# Patient Record
Sex: Male | Born: 1950 | Hispanic: No | Marital: Married | Smoking: Never smoker
Health system: Southern US, Community
[De-identification: ages and names within clinical notes are randomized; demographics above are authoritative.]

## PROBLEM LIST (undated history)

## (undated) DIAGNOSIS — E785 Hyperlipidemia, unspecified: Secondary | ICD-10-CM

## (undated) DIAGNOSIS — T7840XA Allergy, unspecified, initial encounter: Secondary | ICD-10-CM

## (undated) DIAGNOSIS — I10 Essential (primary) hypertension: Secondary | ICD-10-CM

## (undated) DIAGNOSIS — J45909 Unspecified asthma, uncomplicated: Secondary | ICD-10-CM

## (undated) DIAGNOSIS — R251 Tremor, unspecified: Secondary | ICD-10-CM

## (undated) HISTORY — DX: Unspecified asthma, uncomplicated: J45.909

## (undated) HISTORY — DX: Hyperlipidemia, unspecified: E78.5

## (undated) HISTORY — PX: HERNIA REPAIR: SHX51

## (undated) HISTORY — DX: Allergy, unspecified, initial encounter: T78.40XA

## (undated) HISTORY — PX: CARPAL TUNNEL RELEASE: SHX101

## (undated) HISTORY — PX: SHOULDER SURGERY: SHX246

## (undated) HISTORY — DX: Tremor, unspecified: R25.1

## (undated) HISTORY — PX: OTHER SURGICAL HISTORY: SHX169

## (undated) HISTORY — PX: CATARACT EXTRACTION BILATERAL W/ ANTERIOR VITRECTOMY: SHX1304

## (undated) HISTORY — DX: Essential (primary) hypertension: I10

---

## 2001-01-12 ENCOUNTER — Ambulatory Visit (HOSPITAL_BASED_OUTPATIENT_CLINIC_OR_DEPARTMENT_OTHER): Admission: RE | Admit: 2001-01-12 | Discharge: 2001-01-12 | Payer: Self-pay | Admitting: General Surgery

## 2004-09-22 ENCOUNTER — Ambulatory Visit (HOSPITAL_COMMUNITY): Admission: RE | Admit: 2004-09-22 | Discharge: 2004-09-22 | Payer: Self-pay | Admitting: Orthopedic Surgery

## 2012-09-08 ENCOUNTER — Ambulatory Visit: Payer: Self-pay | Admitting: Emergency Medicine

## 2012-09-08 VITALS — BP 142/79 | HR 88 | Temp 98.5°F | Resp 16 | Ht 69.0 in | Wt 170.0 lb

## 2012-09-08 DIAGNOSIS — S61209A Unspecified open wound of unspecified finger without damage to nail, initial encounter: Secondary | ICD-10-CM

## 2012-09-08 NOTE — Patient Instructions (Signed)

## 2012-09-08 NOTE — Progress Notes (Signed)
  Subjective:    Patient ID: Alan Ball, male    DOB: 12-18-1950, 62 y.o.   MRN: [id number]  HPI patient was in his usual state of health and while working with a table saw tried to push a board off the table and sustained a laceration to the tip of his right fifth finger    Review of Systems     Objective:   Physical Exam there is a 1 cm flap-like laceration to the tip of the right fifth finger.        Assessment & Plan:  Patient will have wound repair. He will be updated on his tetanus.

## 2012-09-08 NOTE — Progress Notes (Signed)
Procedure:  Consent obtained.  Local anesthesia with 1% lidocaine.  Wound cleaned and edges debrided to make a smooth edge.  The wound was superficially closed with 5-0 Ethilon #3 sutures and Vicryl #1 into the nail bed.  Drsg applied.  Pt tolerated well.

## 2012-10-04 DIAGNOSIS — K4090 Unilateral inguinal hernia, without obstruction or gangrene, not specified as recurrent: Secondary | ICD-10-CM | POA: Insufficient documentation

## 2013-11-24 ENCOUNTER — Ambulatory Visit (INDEPENDENT_AMBULATORY_CARE_PROVIDER_SITE_OTHER): Payer: Medicare HMO

## 2013-11-24 ENCOUNTER — Ambulatory Visit (INDEPENDENT_AMBULATORY_CARE_PROVIDER_SITE_OTHER): Payer: Medicare HMO | Admitting: Family Medicine

## 2013-11-24 VITALS — BP 134/78 | HR 68 | Temp 98.8°F | Resp 16 | Ht 67.5 in | Wt 173.2 lb

## 2013-11-24 DIAGNOSIS — J988 Other specified respiratory disorders: Secondary | ICD-10-CM

## 2013-11-24 DIAGNOSIS — R059 Cough, unspecified: Secondary | ICD-10-CM

## 2013-11-24 DIAGNOSIS — J45901 Unspecified asthma with (acute) exacerbation: Secondary | ICD-10-CM

## 2013-11-24 DIAGNOSIS — R062 Wheezing: Secondary | ICD-10-CM

## 2013-11-24 DIAGNOSIS — J811 Chronic pulmonary edema: Secondary | ICD-10-CM

## 2013-11-24 DIAGNOSIS — J069 Acute upper respiratory infection, unspecified: Secondary | ICD-10-CM

## 2013-11-24 DIAGNOSIS — R05 Cough: Secondary | ICD-10-CM

## 2013-11-24 DIAGNOSIS — J4521 Mild intermittent asthma with (acute) exacerbation: Secondary | ICD-10-CM

## 2013-11-24 DIAGNOSIS — J22 Unspecified acute lower respiratory infection: Secondary | ICD-10-CM

## 2013-11-24 MED ORDER — ALBUTEROL SULFATE HFA 108 (90 BASE) MCG/ACT IN AERS: 1.0000 | INHALATION_SPRAY | RESPIRATORY_TRACT | Status: DC | PRN

## 2013-11-24 MED ORDER — ALBUTEROL SULFATE (2.5 MG/3ML) 0.083% IN NEBU
2.5000 mg | INHALATION_SOLUTION | Freq: Once | RESPIRATORY_TRACT | Status: AC
  Administered 2013-11-24: 2.5 mg via RESPIRATORY_TRACT

## 2013-11-24 MED ORDER — AZITHROMYCIN 250 MG PO TABS: ORAL_TABLET | ORAL | Status: DC

## 2013-11-24 NOTE — Progress Notes (Signed)
Subjective:    Patient ID: Alan Ball, male    DOB: [birth year], 63 y.o.   MRN: [id number]  HPI Alan Ball is a 63 y.o. male  Sinus and upper respiratory sx's.  Staying in hotel last week.  Kids there. Started with runny nose, sore throat, had some asthma sx's initially - wheezing initially in this illness.  Resolved on own - does not have inhaler, has otc "asthmanefrin" into nebulizer if needed.  More congestion in the morning , some pain behind teetha nd pressure in cheek bones. Occasional sweats at times, fever on 2nd night, no recent measured fever. Sore with cough on chest muscles, congestion in face and chest - trying to cough something up. Coughing up green sputum in the morning. Not dyspneic.   Hx of wheezing occasionally - illness or dust at times. Works in [employer]. Not every week, once every few months. Cold weather affects it. No known allergies.   There are no active problems to display for this patient.  Past Medical History  Diagnosis Date  . Allergy   . Asthma    Past Surgical History  Procedure Laterality Date  . Hernia repair     No Known Allergies Prior to Admission medications   Medication Sig Start Date End Date Taking? Authorizing Provider  Multiple Vitamin (MULTIVITAMIN) tablet Take 1 tablet by mouth.   Yes Historical Provider, MD  vitamin C (ASCORBIC ACID) 500 MG tablet Take 500 mg by mouth daily.   Yes Historical Provider, MD  ibuprofen (ADVIL,MOTRIN) 200 MG tablet Take 200 mg by mouth every 6 (six) hours as needed for pain.    Historical Provider, MD   History   Social History  . Marital Status: Married    Spouse Name: N/A    Number of Children: N/A  . Years of Education: N/A   Occupational History  . Not on file.   Social History Main Topics  . Smoking status: Never Smoker   . Smokeless tobacco: Not on file  . Alcohol Use: No  . Drug Use: Not on file  . Sexual Activity: Not on file   Other Topics Concern  . Not on file   Social  History Narrative  . No narrative on file      Review of Systems  HENT: Positive for congestion, sinus pressure and sore throat.   Respiratory: Positive for cough and wheezing.   Cardiovascular: Negative for chest pain, palpitations and leg swelling.       Objective:   Physical Exam  Vitals reviewed. Constitutional: He is oriented to person, place, and time. He appears well-developed and well-nourished.  HENT:  Head: Normocephalic and atraumatic.  Right Ear: Tympanic membrane, external ear and ear canal normal.  Left Ear: Tympanic membrane, external ear and ear canal normal.  Nose: No rhinorrhea.  Mouth/Throat: Oropharynx is clear and moist and mucous membranes are normal. No oropharyngeal exudate or posterior oropharyngeal erythema.  Eyes: Conjunctivae are normal. Pupils are equal, round, and reactive to light.  Neck: Neck supple.  Cardiovascular: Normal rate, regular rhythm, normal heart sounds and intact distal pulses.   No murmur heard. Pulmonary/Chest: Effort normal. He has wheezes (with coarse breath sounds  RLL, LLL. ). He has no rhonchi. He has no rales.  Abdominal: Soft. There is no tenderness.  Lymphadenopathy:    He has no cervical adenopathy.  Neurological: He is alert and oriented to person, place, and time.  Skin: Skin is warm and dry. No rash noted.  Psychiatric: He has a normal mood and affect. His behavior is normal.   Filed Vitals:   11/24/13 0948  BP: 134/78  Pulse: 68  Temp: 98.8 F (37.1 C)  TempSrc: Oral  Resp: 16  Height: 5' 7.5" (1.715 m)  Weight: 173 lb 3.2 oz (78.563 kg)  SpO2: 94%   Albuterol 2.5 mg neb given. Exam after neb: improved aeration, few faint coarse breath sounds LLL>RLL. No distress. [facility] reading (PRIMARY) by  Dr. Carlota Raspberry: CXR: increased RLL greater than LLL markings.      Assessment & Plan:   Alan Ball is a 63 y.o. male Wheezing - Plan: albuterol (PROVENTIL) (2.5 MG/3ML) 0.083% nebulizer solution 2.5 mg, DG Chest 2  View, albuterol (PROVENTIL HFA;VENTOLIN HFA) 108 (90 BASE) MCG/ACT inhaler  Cough - Plan: albuterol (PROVENTIL) (2.5 MG/3ML) 0.083% nebulizer solution 2.5 mg, DG Chest 2 View, albuterol (PROVENTIL HFA;VENTOLIN HFA) 108 (90 BASE) MCG/ACT inhaler  Asthma with acute exacerbation, mild intermittent - Plan: albuterol (PROVENTIL) (2.5 MG/3ML) 0.083% nebulizer solution 2.5 mg  Pulmonary congestion and hypostasis - Plan: albuterol (PROVENTIL) (2.5 MG/3ML) 0.083% nebulizer solution 2.5 mg  Acute upper respiratory infections of unspecified site - Plan: albuterol (PROVENTIL) (2.5 MG/3ML) 0.083% nebulizer solution 2.5 mg  LRTI (lower respiratory tract infection) - Plan: azithromycin (ZITHROMAX) 250 MG tablet  Suspected LRTi, vs asthmatic bronchitis. Has not had formal diagnosis of asthma, but plan on spirometry when well. Mild/intermittent by sx's. Start zpak, albuterol if needed (RTC if frequent or persistent use). rtc precautions discussed.    Meds ordered this encounter  Medications  . vitamin C (ASCORBIC ACID) 500 MG tablet    Sig: Take 500 mg by mouth daily.  . Multiple Vitamin (MULTIVITAMIN) tablet    Sig: Take 1 tablet by mouth.  Marland Kitchen albuterol (PROVENTIL) (2.5 MG/3ML) 0.083% nebulizer solution 2.5 mg    Sig:   . albuterol (PROVENTIL HFA;VENTOLIN HFA) 108 (90 BASE) MCG/ACT inhaler    Sig: Inhale 1-2 puffs into the lungs every 4 (four) hours as needed for wheezing or shortness of breath.    Dispense:  1 Inhaler    Refill:  0  . azithromycin (ZITHROMAX) 250 MG tablet    Sig: Take 2 pills by mouth on day 1, then 1 pill by mouth per day on days 2 through 5.    Dispense:  5 tablet    Refill:  0   Patient Instructions  Start zpak, use albuterol every 4-6 hours as needed for cough/wheezing.  recheck in next 3-4 days if not improving, Return to the clinic or go to the nearest emergency room if any of your symptoms worsen or new symptoms occur.  Once you are feeling back at your baseline -  recommend return for pulmonary function testing or I can refer you to pulmonologist for this if needed.   Bronchospasm A bronchospasm is a spasm or tightening of the airways going into the lungs. During a bronchospasm breathing becomes more difficult because the airways get smaller. When this happens there can be coughing, a whistling sound when breathing (wheezing), and difficulty breathing. Bronchospasm is often associated with asthma, but not all patients who experience a bronchospasm have asthma. CAUSES  A bronchospasm is caused by inflammation or irritation of the airways. The inflammation or irritation may be triggered by:   Allergies (such as to animals, pollen, food, or mold). Allergens that cause bronchospasm may cause wheezing immediately after exposure or many hours later.   Infection. Viral infections are believed to be  the most common cause of bronchospasm.   Exercise.   Irritants (such as pollution, cigarette smoke, strong odors, aerosol sprays, and paint fumes).   Weather changes. Winds increase molds and pollens in the air. Rain refreshes the air by washing irritants out. Cold air may cause inflammation.   Stress and emotional upset.  SIGNS AND SYMPTOMS   Wheezing.   Excessive nighttime coughing.   Frequent or severe coughing with a simple cold.   Chest tightness.   Shortness of breath.  DIAGNOSIS  Bronchospasm is usually diagnosed through a history and physical exam. Tests, such as chest X-rays, are sometimes done to look for other conditions. TREATMENT   Inhaled medicines can be given to open up your airways and help you breathe. The medicines can be given using either an inhaler or a nebulizer machine.  Corticosteroid medicines may be given for severe bronchospasm, usually when it is associated with asthma. HOME CARE INSTRUCTIONS   Always have a plan prepared for seeking medical care. Know when to call your health care provider and local emergency  services (911 in the [place] Know where you can access local emergency care.  Only take medicines as directed by your health care provider.  If you were prescribed an inhaler or nebulizer machine, ask your health care provider to explain how to use it correctly. Always use a spacer with your inhaler if you were given one.  It is necessary to remain calm during an attack. Try to relax and breathe more slowly.  Control your home environment in the following ways:   Change your heating and air conditioning filter at least once a month.   Limit your use of fireplaces and wood stoves.  Do not smoke and do not allow smoking in your home.   Avoid exposure to perfumes and fragrances.   Get rid of pests (such as roaches and mice) and their droppings.   Throw away plants if you see mold on them.   Keep your house clean and dust free.   Replace carpet with wood, tile, or vinyl flooring. Carpet can trap dander and dust.   Use allergy-proof pillows, mattress covers, and box spring covers.   Wash bed sheets and blankets every week in hot water and dry them in a dryer.   Use blankets that are made of polyester or cotton.   Wash hands frequently. SEEK MEDICAL CARE IF:   You have muscle aches.   You have chest pain.   The sputum changes from clear or white to yellow, green, gray, or bloody.   The sputum you cough up gets thicker.   There are problems that may be related to the medicine you are given, such as a rash, itching, swelling, or trouble breathing.  SEEK IMMEDIATE MEDICAL CARE IF:   You have worsening wheezing and coughing even after taking your prescribed medicines.   You have increased difficulty breathing.   You develop severe chest pain. MAKE SURE YOU:   Understand these instructions.  Will watch your condition.  Will get help right away if you are not doing well or get worse. Document Released: 04/23/2003 Document Revised: 04/25/2013 Document  Reviewed: 10/10/2012 [facility] Patient Information 2015 [place], [place]. This information is not intended to replace advice given to you by your health care provider. Make sure you discuss any questions you have with your health care provider. Acute Bronchitis Bronchitis is inflammation of the airways that extend from the windpipe into the lungs (bronchi). The inflammation often causes mucus  to develop. This leads to a cough, which is the most common symptom of bronchitis.  In acute bronchitis, the condition usually develops suddenly and goes away over time, usually in a couple weeks. Smoking, allergies, and asthma can make bronchitis worse. Repeated episodes of bronchitis may cause further lung problems.  CAUSES Acute bronchitis is most often caused by the same virus that causes a cold. The virus can spread from person to person (contagious) through coughing, sneezing, and touching contaminated objects. SIGNS AND SYMPTOMS   Cough.   Fever.   Coughing up mucus.   Body aches.   Chest congestion.   Chills.   Shortness of breath.   Sore throat.  DIAGNOSIS  Acute bronchitis is usually diagnosed through a physical exam. Your health care provider will also ask you questions about your medical history. Tests, such as chest X-rays, are sometimes done to rule out other conditions.  TREATMENT  Acute bronchitis usually goes away in a couple weeks. Oftentimes, no medical treatment is necessary. Medicines are sometimes given for relief of fever or cough. Antibiotic medicines are usually not needed but may be prescribed in certain situations. In some cases, an inhaler may be recommended to help reduce shortness of breath and control the cough. A cool mist vaporizer may also be used to help thin bronchial secretions and make it easier to clear the chest.  HOME CARE INSTRUCTIONS  Get plenty of rest.   Drink enough fluids to keep your urine clear or pale yellow (unless you have a medical  condition that requires fluid restriction). Increasing fluids may help thin your respiratory secretions (sputum) and reduce chest congestion, and it will prevent dehydration.   Take medicines only as directed by your health care provider.  If you were prescribed an antibiotic medicine, finish it all even if you start to feel better.  Avoid smoking and secondhand smoke. Exposure to cigarette smoke or irritating chemicals will make bronchitis worse. If you are a smoker, consider using nicotine gum or skin patches to help control withdrawal symptoms. Quitting smoking will help your lungs heal faster.   Reduce the chances of another bout of acute bronchitis by washing your hands frequently, avoiding people with cold symptoms, and trying not to touch your hands to your mouth, nose, or eyes.   Keep all follow-up visits as directed by your health care provider.  SEEK MEDICAL CARE IF: Your symptoms do not improve after 1 week of treatment.  SEEK IMMEDIATE MEDICAL CARE IF:  You develop an increased fever or chills.   You have chest pain.   You have severe shortness of breath.  You have bloody sputum.   You develop dehydration.  You faint or repeatedly feel like you are going to pass out.  You develop repeated vomiting.  You develop a severe headache. MAKE SURE YOU:   Understand these instructions.  Will watch your condition.  Will get help right away if you are not doing well or get worse. Document Released: 05/28/2004 Document Revised: 09/04/2013 Document Reviewed: 10/11/2012 [facility] Patient Information 2015 [place], [place]. This information is not intended to replace advice given to you by your health care provider. Make sure you discuss any questions you have with your health care provider. Cough, Adult  A cough is a reflex that helps clear your throat and airways. It can help heal the body or may be a reaction to an irritated airway. A cough may only last 2 or 3 weeks  (acute) or may last more than  8 weeks (chronic).  CAUSES Acute cough:  Viral or bacterial infections. Chronic cough:  Infections.  Allergies.  Asthma.  Post-nasal drip.  Smoking.  Heartburn or acid reflux.  Some medicines.  Chronic lung problems (COPD).  Cancer. SYMPTOMS   Cough.  Fever.  Chest pain.  Increased breathing rate.  High-pitched whistling sound when breathing (wheezing).  Colored mucus that you cough up (sputum). TREATMENT   A bacterial cough may be treated with antibiotic medicine.  A viral cough must run its course and will not respond to antibiotics.  Your caregiver may recommend other treatments if you have a chronic cough. HOME CARE INSTRUCTIONS   Only take over-the-counter or prescription medicines for pain, discomfort, or fever as directed by your caregiver. Use cough suppressants only as directed by your caregiver.  Use a cold steam vaporizer or humidifier in your bedroom or home to help loosen secretions.  Sleep in a semi-upright position if your cough is worse at night.  Rest as needed.  Stop smoking if you smoke. SEEK IMMEDIATE MEDICAL CARE IF:   You have pus in your sputum.  Your cough starts to worsen.  You cannot control your cough with suppressants and are losing sleep.  You begin coughing up blood.  You have difficulty breathing.  You develop pain which is getting worse or is uncontrolled with medicine.  You have a fever. MAKE SURE YOU:   Understand these instructions.  Will watch your condition.  Will get help right away if you are not doing well or get worse. Document Released: 10/17/2010 Document Revised: 07/13/2011 Document Reviewed: 10/17/2010 [facility] Patient Information 2015 [place], [place]. This information is not intended to replace advice given to you by your health care provider. Make sure you discuss any questions you have with your health care provider.

## 2013-11-24 NOTE — Patient Instructions (Signed)
Start zpak, use albuterol every 4-6 hours as needed for cough/wheezing.  recheck in next 3-4 days if not improving, Return to the clinic or go to the nearest emergency room if any of your symptoms worsen or new symptoms occur.  Once you are feeling back at your baseline - recommend return for pulmonary function testing or I can refer you to pulmonologist for this if needed.   Bronchospasm A bronchospasm is a spasm or tightening of the airways going into the lungs. During a bronchospasm breathing becomes more difficult because the airways get smaller. When this happens there can be coughing, a whistling sound when breathing (wheezing), and difficulty breathing. Bronchospasm is often associated with asthma, but not all patients who experience a bronchospasm have asthma. CAUSES  A bronchospasm is caused by inflammation or irritation of the airways. The inflammation or irritation may be triggered by:   Allergies (such as to animals, pollen, food, or mold). Allergens that cause bronchospasm may cause wheezing immediately after exposure or many hours later.   Infection. Viral infections are believed to be the most common cause of bronchospasm.   Exercise.   Irritants (such as pollution, cigarette smoke, strong odors, aerosol sprays, and paint fumes).   Weather changes. Winds increase molds and pollens in the air. Rain refreshes the air by washing irritants out. Cold air may cause inflammation.   Stress and emotional upset.  SIGNS AND SYMPTOMS   Wheezing.   Excessive nighttime coughing.   Frequent or severe coughing with a simple cold.   Chest tightness.   Shortness of breath.  DIAGNOSIS  Bronchospasm is usually diagnosed through a history and physical exam. Tests, such as chest X-rays, are sometimes done to look for other conditions. TREATMENT   Inhaled medicines can be given to open up your airways and help you breathe. The medicines can be given using either an inhaler or a  nebulizer machine.  Corticosteroid medicines may be given for severe bronchospasm, usually when it is associated with asthma. HOME CARE INSTRUCTIONS   Always have a plan prepared for seeking medical care. Know when to call your health care provider and local emergency services (911 in the [place] Know where you can access local emergency care.  Only take medicines as directed by your health care provider.  If you were prescribed an inhaler or nebulizer machine, ask your health care provider to explain how to use it correctly. Always use a spacer with your inhaler if you were given one.  It is necessary to remain calm during an attack. Try to relax and breathe more slowly.  Control your home environment in the following ways:   Change your heating and air conditioning filter at least once a month.   Limit your use of fireplaces and wood stoves.  Do not smoke and do not allow smoking in your home.   Avoid exposure to perfumes and fragrances.   Get rid of pests (such as roaches and mice) and their droppings.   Throw away plants if you see mold on them.   Keep your house clean and dust free.   Replace carpet with wood, tile, or vinyl flooring. Carpet can trap dander and dust.   Use allergy-proof pillows, mattress covers, and box spring covers.   Wash bed sheets and blankets every week in hot water and dry them in a dryer.   Use blankets that are made of polyester or cotton.   Wash hands frequently. SEEK MEDICAL CARE IF:   You have muscle  aches.   You have chest pain.   The sputum changes from clear or white to yellow, green, gray, or bloody.   The sputum you cough up gets thicker.   There are problems that may be related to the medicine you are given, such as a rash, itching, swelling, or trouble breathing.  SEEK IMMEDIATE MEDICAL CARE IF:   You have worsening wheezing and coughing even after taking your prescribed medicines.   You have increased  difficulty breathing.   You develop severe chest pain. MAKE SURE YOU:   Understand these instructions.  Will watch your condition.  Will get help right away if you are not doing well or get worse. Document Released: 04/23/2003 Document Revised: 04/25/2013 Document Reviewed: 10/10/2012 [facility] Patient Information 2015 [place], [place]. This information is not intended to replace advice given to you by your health care provider. Make sure you discuss any questions you have with your health care provider. Acute Bronchitis Bronchitis is inflammation of the airways that extend from the windpipe into the lungs (bronchi). The inflammation often causes mucus to develop. This leads to a cough, which is the most common symptom of bronchitis.  In acute bronchitis, the condition usually develops suddenly and goes away over time, usually in a couple weeks. Smoking, allergies, and asthma can make bronchitis worse. Repeated episodes of bronchitis may cause further lung problems.  CAUSES Acute bronchitis is most often caused by the same virus that causes a cold. The virus can spread from person to person (contagious) through coughing, sneezing, and touching contaminated objects. SIGNS AND SYMPTOMS   Cough.   Fever.   Coughing up mucus.   Body aches.   Chest congestion.   Chills.   Shortness of breath.   Sore throat.  DIAGNOSIS  Acute bronchitis is usually diagnosed through a physical exam. Your health care provider will also ask you questions about your medical history. Tests, such as chest X-rays, are sometimes done to rule out other conditions.  TREATMENT  Acute bronchitis usually goes away in a couple weeks. Oftentimes, no medical treatment is necessary. Medicines are sometimes given for relief of fever or cough. Antibiotic medicines are usually not needed but may be prescribed in certain situations. In some cases, an inhaler may be recommended to help reduce shortness of breath and  control the cough. A cool mist vaporizer may also be used to help thin bronchial secretions and make it easier to clear the chest.  HOME CARE INSTRUCTIONS  Get plenty of rest.   Drink enough fluids to keep your urine clear or pale yellow (unless you have a medical condition that requires fluid restriction). Increasing fluids may help thin your respiratory secretions (sputum) and reduce chest congestion, and it will prevent dehydration.   Take medicines only as directed by your health care provider.  If you were prescribed an antibiotic medicine, finish it all even if you start to feel better.  Avoid smoking and secondhand smoke. Exposure to cigarette smoke or irritating chemicals will make bronchitis worse. If you are a smoker, consider using nicotine gum or skin patches to help control withdrawal symptoms. Quitting smoking will help your lungs heal faster.   Reduce the chances of another bout of acute bronchitis by washing your hands frequently, avoiding people with cold symptoms, and trying not to touch your hands to your mouth, nose, or eyes.   Keep all follow-up visits as directed by your health care provider.  SEEK MEDICAL CARE IF: Your symptoms do not improve after  1 week of treatment.  SEEK IMMEDIATE MEDICAL CARE IF:  You develop an increased fever or chills.   You have chest pain.   You have severe shortness of breath.  You have bloody sputum.   You develop dehydration.  You faint or repeatedly feel like you are going to pass out.  You develop repeated vomiting.  You develop a severe headache. MAKE SURE YOU:   Understand these instructions.  Will watch your condition.  Will get help right away if you are not doing well or get worse. Document Released: 05/28/2004 Document Revised: 09/04/2013 Document Reviewed: 10/11/2012 [facility] Patient Information 2015 [place], [place]. This information is not intended to replace advice given to you by your health care  provider. Make sure you discuss any questions you have with your health care provider. Cough, Adult  A cough is a reflex that helps clear your throat and airways. It can help heal the body or may be a reaction to an irritated airway. A cough may only last 2 or 3 weeks (acute) or may last more than 8 weeks (chronic).  CAUSES Acute cough:  Viral or bacterial infections. Chronic cough:  Infections.  Allergies.  Asthma.  Post-nasal drip.  Smoking.  Heartburn or acid reflux.  Some medicines.  Chronic lung problems (COPD).  Cancer. SYMPTOMS   Cough.  Fever.  Chest pain.  Increased breathing rate.  High-pitched whistling sound when breathing (wheezing).  Colored mucus that you cough up (sputum). TREATMENT   A bacterial cough may be treated with antibiotic medicine.  A viral cough must run its course and will not respond to antibiotics.  Your caregiver may recommend other treatments if you have a chronic cough. HOME CARE INSTRUCTIONS   Only take over-the-counter or prescription medicines for pain, discomfort, or fever as directed by your caregiver. Use cough suppressants only as directed by your caregiver.  Use a cold steam vaporizer or humidifier in your bedroom or home to help loosen secretions.  Sleep in a semi-upright position if your cough is worse at night.  Rest as needed.  Stop smoking if you smoke. SEEK IMMEDIATE MEDICAL CARE IF:   You have pus in your sputum.  Your cough starts to worsen.  You cannot control your cough with suppressants and are losing sleep.  You begin coughing up blood.  You have difficulty breathing.  You develop pain which is getting worse or is uncontrolled with medicine.  You have a fever. MAKE SURE YOU:   Understand these instructions.  Will watch your condition.  Will get help right away if you are not doing well or get worse. Document Released: 10/17/2010 Document Revised: 07/13/2011 Document Reviewed:  10/17/2010 [facility] Patient Information 2015 [place], [place]. This information is not intended to replace advice given to you by your health care provider. Make sure you discuss any questions you have with your health care provider.

## 2013-12-25 ENCOUNTER — Encounter: Payer: Self-pay | Admitting: Family Medicine

## 2013-12-25 ENCOUNTER — Ambulatory Visit (INDEPENDENT_AMBULATORY_CARE_PROVIDER_SITE_OTHER): Payer: Medicare HMO | Admitting: Family Medicine

## 2013-12-25 VITALS — BP 146/80 | HR 74 | Temp 97.6°F | Resp 16 | Ht 68.75 in | Wt 175.0 lb

## 2013-12-25 DIAGNOSIS — Z23 Encounter for immunization: Secondary | ICD-10-CM

## 2013-12-25 DIAGNOSIS — J3081 Allergic rhinitis due to animal (cat) (dog) hair and dander: Secondary | ICD-10-CM

## 2013-12-25 DIAGNOSIS — R05 Cough: Secondary | ICD-10-CM

## 2013-12-25 DIAGNOSIS — R062 Wheezing: Secondary | ICD-10-CM

## 2013-12-25 DIAGNOSIS — R059 Cough, unspecified: Secondary | ICD-10-CM

## 2013-12-25 LAB — PULMONARY FUNCTION TEST

## 2013-12-25 NOTE — Progress Notes (Signed)
Subjective:    Patient ID: Alan Ball, male    DOB: 06-21-1950, 63 y.o.   MRN: [id number]  HPI Alan Ball is a 63 y.o. male  See last ov - 11/24/13. Suspected LRTi, vs asthmatic bronchitis. Has not had formal diagnosis of asthma, but planned on spirometry when well. Mild/intermittent by sx's. Started zpak, albuterol if needed (RTC if frequent or persistent use).  Has been feeling well,  Felt better after zpak.  Has not needed albuterol in past week. 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   Occasional pnd, rare cough..  Possible allergy to dog dander. Uses claritin rarely.   No f/c/dyspne/chest pain/leg swelling.    There are no active problems to display for this patient.  Past Medical History  Diagnosis Date  . Allergy   . Asthma    Past Surgical History  Procedure Laterality Date  . Hernia repair     No Known Allergies Prior to Admission medications   Medication Sig Start Date End Date Taking? Authorizing Provider  albuterol (PROVENTIL HFA;VENTOLIN HFA) 108 (90 BASE) MCG/ACT inhaler Inhale 1-2 puffs into the lungs every 4 (four) hours as needed for wheezing or shortness of breath. 11/24/13  Yes [name], MD  ibuprofen (ADVIL,MOTRIN) 200 MG tablet Take 200 mg by mouth every 6 (six) hours as needed for pain.   Yes Historical Provider, MD  Multiple Vitamin (MULTIVITAMIN) tablet Take 1 tablet by mouth.   Yes Historical Provider, MD  vitamin C (ASCORBIC ACID) 500 MG tablet Take 500 mg by mouth daily.   Yes Historical Provider, MD  azithromycin (ZITHROMAX) 250 MG tablet Take 2 pills by mouth on day 1, then 1 pill by mouth per day on days 2 through 5. 11/24/13   [name], MD   History   Social History  . Marital Status: Married    Spouse Name: N/A    Number of Children: N/A  . Years of Education: N/A   Occupational History  . Not on file.   Social History Main Topics  . Smoking status: Never Smoker   . Smokeless tobacco:  Not on file  . Alcohol Use: No  . Drug Use: Not on file  . Sexual Activity: Not on file   Other Topics Concern  . Not on file   Social History Narrative  . No narrative on file       Review of Systems  Constitutional: Negative for unexpected weight change.  HENT: Positive for congestion and postnasal drip.   Respiratory: Positive for cough (rare. ). Negative for chest tightness, shortness of breath and wheezing (not current. ).   Cardiovascular: Negative for chest pain, palpitations and leg swelling.       Objective:   Physical Exam  Vitals reviewed. Constitutional: He is oriented to person, place, and time. He appears well-developed and well-nourished.  HENT:  Head: Normocephalic and atraumatic.  Right Ear: Tympanic membrane, external ear and ear canal normal.  Left Ear: Tympanic membrane, external ear and ear canal normal.  Nose: No rhinorrhea.  Mouth/Throat: Oropharynx is clear and moist and mucous membranes are normal. No oropharyngeal exudate or posterior oropharyngeal erythema.  Eyes: Conjunctivae are normal. Pupils are equal, round, and reactive to light.  Neck: Neck supple.  Cardiovascular: Normal rate, regular rhythm, normal heart sounds and intact distal pulses.   No murmur heard. Pulmonary/Chest: Effort normal and breath sounds normal. He has no wheezes. He has no rhonchi. He has no rales.  Abdominal: Soft. There is no tenderness.  Musculoskeletal: He exhibits no edema.  Lymphadenopathy:    He has no cervical adenopathy.  Neurological: He is alert and oriented to person, place, and time.  Skin: Skin is warm and dry. No rash noted.  Psychiatric: He has a normal mood and affect. His behavior is normal.   Filed Vitals:   12/25/13 1250  BP: 146/80  Pulse: 74  Temp: 97.6 F (36.4 C)  TempSrc: Oral  Resp: 16  Height: 5' 8.75" (1.746 m)  Weight: 175 lb (79.379 kg)  SpO2: 97%    Spirometry: Mild obstruction: FVC:99% FEV1:89% FEV/FVC: 90% FEF 25:  45%    Assessment & Plan:   Alan Ball is a 63 y.o. male Wheezing  Allergic rhinitis due to animal hair and dander  Need for prophylactic vaccination and inoculation against influenza - Plan: Flu Vaccine QUAD 36+ mos IM  Need for prophylactic vaccination against Streptococcus pneumoniae (pneumococcus) - Plan: Pneumococcal conjugate vaccine 13-valent IM  Cough  Improved LRTI with associated bronchospasm. Some element of obstructive disease on spirometry. 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 Can try albuterol only if needed for now, but offered pulmonary eval if he would like.  If incr use of albuterol as below - rtc. D/t likely coexisting upper airway sx's/allergic rhinitis - cont nonsedating antihistamine +/- steroid nasal spray otc. rtc precautions.   prevnar given, return in 8 weeks for pneumovax.   No orders of the defined types were placed in this encounter.   Patient Instructions  Your lung function test was indicative of some mild obstruction.  Commonly this is asthma or copd.  For now, can continue allergy medicine like claritin, allegra or zyrtec, over the counter flonase if needed, and albuterol inhaler if tight cough or wheezing.  If you use this more than a few times per week, or nighttime symptoms - let me know as we may need to try different daily preventative medicine.  Return to the clinic or go to the nearest emergency room if any of your symptoms worsen or new symptoms occur. Let me know if you would also like to meet with a pulmonologist, and I can refer you.   Flu vaccine and 1 of 2 recommended pneumonia vaccines given today.  Return after 8 weeks for other pneumonia vaccine (Pneumovax). Immunization only.   recheck with me in next 6 months.   Bronchospasm A bronchospasm is when the tubes that carry air in and out of your lungs (airways) spasm or tighten. During a bronchospasm it is hard to  breathe. This is because the airways get smaller. A bronchospasm can be triggered by:  Allergies. These may be to animals, pollen, food, or mold.  Infection. This is a common cause of bronchospasm.  Exercise.  Irritants. These include pollution, cigarette smoke, strong odors, aerosol sprays, and paint fumes.  Weather changes.  Stress.  Being emotional. HOME CARE   Always have a plan for getting help. Know when to call your doctor and local emergency services (911 in the [place] Know where you can get emergency care.  Only take medicines as told by your doctor.  If you were prescribed an inhaler or nebulizer machine, ask your doctor how to use it correctly. Always use a spacer with your inhaler if you were given one.  Stay calm during an attack. Try to relax and breathe more slowly.  Control your home environment:  Change your  heating and air conditioning filter at least once a month.  Limit your use of fireplaces and wood stoves.  Do not  smoke. Do not  allow smoking in your home.  Avoid perfumes and fragrances.  Get rid of pests (such as roaches and mice) and their droppings.  Throw away plants if you see mold on them.  Keep your house clean and dust free.  Replace carpet with wood, tile, or vinyl flooring. Carpet can trap dander and dust.  Use allergy-proof pillows, mattress covers, and box spring covers.  Wash bed sheets and blankets every week in hot water. Dry them in a dryer.  Use blankets that are made of polyester or cotton.  Wash hands frequently. GET HELP IF:  You have muscle aches.  You have chest pain.  The thick spit you spit or cough up (sputum) changes from clear or white to yellow, green, gray, or bloody.  The thick spit you spit or cough up gets thicker.  There are problems that may be related to the medicine you are given such as:  A rash.  Itching.  Swelling.  Trouble breathing. GET HELP RIGHT AWAY IF:  You feel you cannot  breathe or catch your breath.  You cannot stop coughing.  Your treatment is not helping you breathe better.  You have very bad chest pain. MAKE SURE YOU:   Understand these instructions.  Will watch your condition.  Will get help right away if you are not doing well or get worse. Document Released: 02/15/2009 Document Revised: 04/25/2013 Document Reviewed: 10/11/2012 [facility] Patient Information 2015 [place], [place]. This information is not intended to replace advice given to you by your health care provider. Make sure you discuss any questions you have with your health care provider.

## 2013-12-25 NOTE — Patient Instructions (Addendum)
Your lung function test was indicative of some mild obstruction.  Commonly this is asthma or copd.  For now, can continue allergy medicine like claritin, allegra or zyrtec, over the counter flonase if needed, and albuterol inhaler if tight cough or wheezing.  If you use this more than a few times per week, or nighttime symptoms - let me know as we may need to try different daily preventative medicine.  Return to the clinic or go to the nearest emergency room if any of your symptoms worsen or new symptoms occur. Let me know if you would also like to meet with a pulmonologist, and I can refer you.   Flu vaccine and 1 of 2 recommended pneumonia vaccines given today.  Return after 8 weeks for other pneumonia vaccine (Pneumovax). Immunization only.   recheck with me in next 6 months.   Bronchospasm A bronchospasm is when the tubes that carry air in and out of your lungs (airways) spasm or tighten. During a bronchospasm it is hard to breathe. This is because the airways get smaller. A bronchospasm can be triggered by:  Allergies. These may be to animals, pollen, food, or mold.  Infection. This is a common cause of bronchospasm.  Exercise.  Irritants. These include pollution, cigarette smoke, strong odors, aerosol sprays, and paint fumes.  Weather changes.  Stress.  Being emotional. HOME CARE   Always have a plan for getting help. Know when to call your doctor and local emergency services (911 in the [place] Know where you can get emergency care.  Only take medicines as told by your doctor.  If you were prescribed an inhaler or nebulizer machine, ask your doctor how to use it correctly. Always use a spacer with your inhaler if you were given one.  Stay calm during an attack. Try to relax and breathe more slowly.  Control your home environment:  Change your heating and air conditioning filter at least once a month.  Limit your use of fireplaces and wood stoves.  Do not  smoke. Do not   allow smoking in your home.  Avoid perfumes and fragrances.  Get rid of pests (such as roaches and mice) and their droppings.  Throw away plants if you see mold on them.  Keep your house clean and dust free.  Replace carpet with wood, tile, or vinyl flooring. Carpet can trap dander and dust.  Use allergy-proof pillows, mattress covers, and box spring covers.  Wash bed sheets and blankets every week in hot water. Dry them in a dryer.  Use blankets that are made of polyester or cotton.  Wash hands frequently. GET HELP IF:  You have muscle aches.  You have chest pain.  The thick spit you spit or cough up (sputum) changes from clear or white to yellow, green, gray, or bloody.  The thick spit you spit or cough up gets thicker.  There are problems that may be related to the medicine you are given such as:  A rash.  Itching.  Swelling.  Trouble breathing. GET HELP RIGHT AWAY IF:  You feel you cannot breathe or catch your breath.  You cannot stop coughing.  Your treatment is not helping you breathe better.  You have very bad chest pain. MAKE SURE YOU:   Understand these instructions.  Will watch your condition.  Will get help right away if you are not doing well or get worse. Document Released: 02/15/2009 Document Revised: 04/25/2013 Document Reviewed: 10/11/2012 [facility] Patient Information 2015 [place], [place]. This  information is not intended to replace advice given to you by your health care provider. Make sure you discuss any questions you have with your health care provider.  

## 2014-01-04 ENCOUNTER — Encounter: Payer: Self-pay | Admitting: Family Medicine

## 2014-03-16 ENCOUNTER — Ambulatory Visit (INDEPENDENT_AMBULATORY_CARE_PROVIDER_SITE_OTHER): Payer: Medicare HMO | Admitting: *Deleted

## 2014-03-16 DIAGNOSIS — Z23 Encounter for immunization: Secondary | ICD-10-CM

## 2014-05-10 ENCOUNTER — Telehealth: Payer: Self-pay

## 2014-05-10 NOTE — Telephone Encounter (Signed)
Patient left voicemail at 11:59am asking if he has had the whooping cough vaccine. Please call back at [phone].

## 2014-05-11 NOTE — Telephone Encounter (Signed)
Spoke with patient. He received tdap on Sep 08, 2012 and includes pertussis (for whooping cough). Patient understands.

## 2014-05-11 NOTE — Telephone Encounter (Signed)
Patient requesting to know how long is a Tdap good for. Patient believes his last one done at our office was 09/08/2012. Patients call back number is [phone]

## 2014-06-04 ENCOUNTER — Ambulatory Visit (INDEPENDENT_AMBULATORY_CARE_PROVIDER_SITE_OTHER): Payer: Medicare HMO

## 2014-06-04 ENCOUNTER — Ambulatory Visit (INDEPENDENT_AMBULATORY_CARE_PROVIDER_SITE_OTHER): Payer: Medicare HMO | Admitting: Family Medicine

## 2014-06-04 ENCOUNTER — Encounter: Payer: Self-pay | Admitting: Family Medicine

## 2014-06-04 VITALS — BP 134/84 | HR 60 | Temp 97.9°F | Resp 16 | Ht 66.75 in | Wt 175.0 lb

## 2014-06-04 DIAGNOSIS — M25511 Pain in right shoulder: Secondary | ICD-10-CM

## 2014-06-04 DIAGNOSIS — R062 Wheezing: Secondary | ICD-10-CM

## 2014-06-04 DIAGNOSIS — R059 Cough, unspecified: Secondary | ICD-10-CM

## 2014-06-04 DIAGNOSIS — R05 Cough: Secondary | ICD-10-CM

## 2014-06-04 MED ORDER — ALBUTEROL SULFATE HFA 108 (90 BASE) MCG/ACT IN AERS: 1.0000 | INHALATION_SPRAY | RESPIRATORY_TRACT | Status: DC | PRN

## 2014-06-04 NOTE — Progress Notes (Addendum)
Subjective:  This chart was scribed for Alan Ray, MD by Dellis Filbert, ED Scribe at [facility].The patient was seen in exam room 28 and the patient's care was started at 4:13 PM.   Patient ID: Alan Ball, male    DOB: February 25, 1951, 64 y.o.   MRN: [id number] Chief Complaint  Patient presents with  . Shoulder Pain    (R) shoulder x 3 weeks   HPI HPI Comments: Alan Ball is a 64 y.o. male with a history of asthma and allergies who presents to [facility] complaining of right shoulder pain, onset 3 weeks ago. Pt fell backward on a icy deck, possible fosh with arm behind him. He has pain with certain movements. Right shoulder pain and upper arm no elbow or wrist pain. He had temporary neck pain, back pain and bruising but this has relieved. As long as arms are chest level he can use his arm, but once he lifts his shoulder over his head he experiences mild discomfort. He has taken meloxicam and Advil for relief.  He has had previous surgery on left shoulder, no previous surgery on his right shoulder. He is right hand dominant. 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 Expecting a grandchild Feb 19th 2016.  Mild intermittent asthma: last discussed August 2015. Some element of obstructive spirometry.  Pt states he has very rare symptoms and last used his albuterol 3 weeks ago. He uses Nasacort as needed.   There are no active problems to display for this patient.  Past Medical History  Diagnosis Date  . Allergy   . Asthma    Past Surgical History  Procedure Laterality Date  . Hernia repair     No Known Allergies Prior to Admission medications   Medication Sig Start Date End Date Taking? Authorizing Provider  albuterol (PROVENTIL HFA;VENTOLIN HFA) 108 (90 BASE) MCG/ACT inhaler Inhale 1-2 puffs into the lungs every 4 (four) hours as needed for wheezing or shortness of breath. 11/24/13  Yes [name], MD  ibuprofen (ADVIL,MOTRIN) 200 MG tablet Take 200 mg by mouth every 6 (six) hours  as needed for pain.   Yes Historical Provider, MD  meloxicam (MOBIC) 15 MG tablet Take 15 mg by mouth daily.   Yes Historical Provider, MD  Multiple Vitamin (MULTIVITAMIN) tablet Take 1 tablet by mouth.   Yes Historical Provider, MD  vitamin C (ASCORBIC ACID) 500 MG tablet Take 500 mg by mouth daily.   Yes Historical Provider, MD   History   Social History  . Marital Status: Married    Spouse Name: N/A    Number of Children: N/A  . Years of Education: N/A   Occupational History  . Not on file.   Social History Main Topics  . Smoking status: Never Smoker   . Smokeless tobacco: Not on file  . Alcohol Use: No  . Drug Use: Not on file  . Sexual Activity: Not on file   Other Topics Concern  . Not on file   Social History Narrative   Review of Systems  Musculoskeletal: Positive for arthralgias.      Objective:  BP 134/84 mmHg  Pulse 60  Temp(Src) 97.9 F (36.6 C) (Oral)  Resp 16  Ht 5' 6.75" (1.695 m)  Wt 175 lb (79.379 kg)  BMI 27.63 kg/m2  SpO2 97%  Physical Exam  Constitutional: He is oriented to person, place, and time. He appears well-developed and well-nourished. No distress.  HENT:  Head: Normocephalic and atraumatic.  Eyes: Pupils are equal, round, and reactive to light.  Neck: Normal range of motion.  Cardiovascular: Normal rate, regular rhythm and normal heart sounds.  Exam reveals no gallop and no friction rub.   No murmur heard. Pulmonary/Chest: Effort normal and breath sounds normal. No respiratory distress.  Lungs are clear bilaterally.  Musculoskeletal: Normal range of motion.  Minimally decreased ROM of cervical spine but does not reproduce shoulder symptoms No [place] or AC tenderness in the right shoulder. Right shoulder has full ROM but pain with external rotation and internal rotation. On rotator cuff testing he is weak and has pain with external rotation otherwise intact. There is a positive Neer's, negative Hawkin's Pain O'Brien's but guarded in  this area of flexion.  Neurological: He is alert and oriented to person, place, and time.  Skin: Skin is warm and dry.  Psychiatric: He has a normal mood and affect. His behavior is normal.  Nursing note and vitals reviewed.  [facility] reading (PRIMARY) by  Dr. Carlota Raspberry: R shoulder - possible glenoid irregularity on one view only, no discrete fracture otherwise.      Assessment & Plan:   Alan Ball is a 64 y.o. male Wheezing ,Cough - Plan: albuterol (PROVENTIL HFA;VENTOLIN HFA) 108 (90 BASE) MCG/ACT inhaler  -mild/intermittent asthma. Controlled with infrequent albuterol dosing and mgt of upper airway triggers/allergic rhinitis with nasal spray. Refilled albuterol for prn use.   Pain in joint, shoulder region, right - Plan: DG Shoulder Right, Ambulatory referral to Orthopedic Surgery  - possible FOOSH mechanism 3 weeks a go with residual OH difficulty. External rotation RTC testing weak - possible infraspinatus or teres minor injury, but with "click", ddx includes labral injury.   -refer to ortho for eval and most likley will have MRI done at that office.   -sx care with nsaid OR meloxicam, avoid OH activities for now, and sling only if needed. rtc precautions.   Meds ordered this encounter  Medications  . meloxicam (MOBIC) 15 MG tablet    Sig: Take 15 mg by mouth daily.  Marland Kitchen albuterol (PROVENTIL HFA;VENTOLIN HFA) 108 (90 BASE) MCG/ACT inhaler    Sig: Inhale 1-2 puffs into the lungs every 4 (four) hours as needed for wheezing or shortness of breath.    Dispense:  1 Inhaler    Refill:  0   There are no Patient Instructions on file for this visit.    I personally performed the services described in this documentation, which was scribed in my presence. The recorded information has been reviewed and considered, and addended by me as needed.

## 2014-10-08 ENCOUNTER — Telehealth: Payer: Self-pay

## 2014-10-08 NOTE — Telephone Encounter (Signed)
Pt states he would like to try the single packs of VIAGRA. Please call [phone]      [facility]

## 2014-10-09 NOTE — Telephone Encounter (Signed)
Advised pt he needs to be seen. Pt understood.

## 2014-10-10 ENCOUNTER — Ambulatory Visit (INDEPENDENT_AMBULATORY_CARE_PROVIDER_SITE_OTHER): Payer: Medicare HMO

## 2014-11-19 ENCOUNTER — Ambulatory Visit (INDEPENDENT_AMBULATORY_CARE_PROVIDER_SITE_OTHER): Payer: Medicare HMO | Admitting: Family Medicine

## 2014-11-19 VITALS — BP 118/80 | HR 71 | Temp 97.5°F | Resp 16 | Ht 68.0 in | Wt 175.0 lb

## 2014-11-19 DIAGNOSIS — Z79899 Other long term (current) drug therapy: Secondary | ICD-10-CM

## 2014-11-19 DIAGNOSIS — L237 Allergic contact dermatitis due to plants, except food: Secondary | ICD-10-CM | POA: Diagnosis not present

## 2014-11-19 LAB — GLUCOSE, POCT (MANUAL RESULT ENTRY): POC Glucose: 95 mg/dl (ref 70–99)

## 2014-11-19 MED ORDER — TRIAMCINOLONE ACETONIDE 0.1 % EX CREA: 1.0000 "application " | TOPICAL_CREAM | Freq: Two times a day (BID) | CUTANEOUS | Status: DC

## 2014-11-19 MED ORDER — PREDNISONE 20 MG PO TABS: ORAL_TABLET | ORAL | Status: DC

## 2014-11-19 NOTE — Progress Notes (Signed)
Subjective:    Patient ID: Alan Ball, male    DOB: 12-02-1950, 64 y.o.   MRN: [id number] This chart was scribed for [name], MD by Zola Button, Medical Scribe. This patient was seen in Room 2 and the patient's care was started at 12:29 PM.   HPI HPI Comments: Alan Ball is a 63 y.o. male who presents to the [facility] complaining of gradual onset rash to his right arm and the right side of his neck that started yesterday. 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 He has noticed some swelling to the areas. He has tried Benadryl PO and calamine lotion. He denies throat irritation. Patient has never been tested for diabetes.   There are no active problems to display for this patient.  Past Medical History  Diagnosis Date  . Allergy   . Asthma    Past Surgical History  Procedure Laterality Date  . Hernia repair     No Known Allergies Prior to Admission medications   Medication Sig Start Date End Date Taking? Authorizing Provider  albuterol (PROVENTIL HFA;VENTOLIN HFA) 108 (90 BASE) MCG/ACT inhaler Inhale 1-2 puffs into the lungs every 4 (four) hours as needed for wheezing or shortness of breath. 06/04/14  Yes [name], MD  ibuprofen (ADVIL,MOTRIN) 200 MG tablet Take 200 mg by mouth every 6 (six) hours as needed for pain.   Yes Historical Provider, MD  meloxicam (MOBIC) 15 MG tablet Take 15 mg by mouth daily.   Yes Historical Provider, MD  Multiple Vitamin (MULTIVITAMIN) tablet Take 1 tablet by mouth.   Yes Historical Provider, MD  vitamin C (ASCORBIC ACID) 500 MG tablet Take 500 mg by mouth daily.   Yes Historical Provider, MD   History   Social History  . Marital Status: Married    Spouse Name: N/A  . Number of Children: N/A  . Years of Education: N/A   Occupational History  . Not on file.   Social History Main Topics  . Smoking status: Never Smoker   . Smokeless tobacco: Not  on file  . Alcohol Use: No  . Drug Use: Not on file  . Sexual Activity: Not on file   Other Topics Concern  . Not on file   Social History Narrative     Review of Systems  HENT: Negative for sore throat.   Skin: Positive for rash.       Objective:   Physical Exam  Constitutional: He is oriented to person, place, and time. He appears well-developed and well-nourished. No distress.  HENT:  Head: Normocephalic and atraumatic.  Mouth/Throat: Oropharynx is clear and moist. No oropharyngeal exudate.  Eyes: Pupils are equal, round, and reactive to light.  Neck: Neck supple.  Cardiovascular: Normal rate, regular rhythm and normal heart sounds.  Exam reveals no gallop and no friction rub.   No murmur heard. Pulmonary/Chest: Effort normal and breath sounds normal. No respiratory distress. He has no wheezes. He has no rhonchi. He has no rales.  No stridor.  Musculoskeletal: He exhibits no edema.  Neurological: He is alert and oriented to person, place, and time. No cranial nerve deficit.  Skin: Skin is warm and dry. Rash noted. There is erythema.  On the right side of his neck are erythematous patches extending from just below his right ear to the clavicle anteriorly and just shy of the midline of posterior  neck. Multiple erythematous and excoriated patches and plaques with few small vesicular lesions centrally. No interdigital lesions. Left arm, chest and back are unaffected.  Psychiatric: He has a normal mood and affect. His behavior is normal.  Vitals reviewed.     Filed Vitals:   11/19/14 1139  BP: 118/80  Pulse: 71  Temp: 97.5 F (36.4 C)  TempSrc: Oral  Resp: 16  Height: 5\' 8"  (1.727 m)  Weight: 175 lb (79.379 kg)  SpO2: 99%    Results for orders placed or performed in visit on 11/19/14  POCT glucose (manual entry)  Result Value Ref Range   POC Glucose 95 70 - 99 mg/dl       Assessment & Plan:   Alan Ball is a 64 y.o. male High risk medication use - Plan:  POCT glucose (manual entry)  Contact dermatitis due to poison ivy - Plan: POCT glucose (manual entry), triamcinolone cream (KENALOG) 0.1 %, predniSONE (DELTASONE) 20 MG tablet   contact dermatitis from poison ivy, limited to right arm and right neck only. Start with triamcinolone topically 2-3 times per day for now, but if any spread of rash, especially to face her groin, start prednisone taper. Prescription printed for this. Side effects discussed if he does need to take prednisone. Watch right arm for any increasing redness or increasing swelling for secondary infection, but does not appear to be infected at this time. RTC precautions discussed.  Meds ordered this encounter  Medications  . triamcinolone cream (KENALOG) 0.1 %    Sig: Apply 1 application topically 2 (two) times daily.    Dispense:  30 g    Refill:  0  . predniSONE (DELTASONE) 20 MG tablet    Sig: 3 by mouth for 3 days, then 2 by mouth for 2 days, then 1 by mouth for 2 days, then 1/2 by mouth for 2 days.    Dispense:  16 tablet    Refill:  0   Patient Instructions   You're to rashes appear to be contact dermatitis due to poison ivy. As it is not spreading, at this point you can use the triamcinolone cream 2-3 times per day  Over affected areas only. Okay to continue calamine lotion,  Other information as and handout below. If the rash spreads 2 other areas or involves face or genital area, start prednisone as prescribed. Return to the clinic or go to the nearest emergency room if any of your symptoms worsen or new symptoms occur.  Poison [employer] ivy is a inflammation of the skin (contact dermatitis) caused by touching the allergens on the leaves of the ivy plant following previous exposure to the plant. The rash usually appears 48 hours after exposure. The rash is usually bumps (papules) or blisters (vesicles) in a linear pattern. Depending on your own sensitivity, the rash may simply cause redness and itching, or it may also  progress to blisters which may break open. These must be well cared for to prevent secondary bacterial (germ) infection, followed by scarring. Keep any open areas dry, clean, dressed, and covered with an antibacterial ointment if needed. The eyes may also get puffy. The puffiness is worst in the morning and gets better as the day progresses. This dermatitis usually heals without scarring, within 2 to 3 weeks without treatment. HOME CARE INSTRUCTIONS  Thoroughly wash with soap and water as soon as you have been exposed to poison ivy. You have about one half hour to remove the plant resin before  it will cause the rash. This washing will destroy the oil or antigen on the skin that is causing, or will cause, the rash. Be sure to wash under your fingernails as any plant resin there will continue to spread the rash. Do not rub skin vigorously when washing affected area. Poison ivy cannot spread if no oil from the plant remains on your body. A rash that has progressed to weeping sores will not spread the rash unless you have not washed thoroughly. It is also important to wash any clothes you have been wearing as these may carry active allergens. The rash will return if you wear the unwashed clothing, even several days later. Avoidance of the plant in the future is the best measure. Poison ivy plant can be recognized by the number of leaves. Generally, poison ivy has three leaves with flowering branches on a single stem. Diphenhydramine may be purchased over the counter and used as needed for itching. Do not drive with this medication if it makes you drowsy.Ask your caregiver about medication for children. SEEK MEDICAL CARE IF:  Open sores develop.  Redness spreads beyond area of rash.  You notice purulent (pus-like) discharge.  You have increased pain.  Other signs of infection develop (such as fever). Document Released: 04/17/2000 Document Revised: 07/13/2011 Document Reviewed: 09/28/2008 [facility]  Patient Information 2015 [place], [place]. This information is not intended to replace advice given to you by your health care provider. Make sure you discuss any questions you have with your health care provider.     I personally performed the services described in this documentation, which was scribed in my presence. The recorded information has been reviewed and considered, and addended by me as needed.

## 2014-11-19 NOTE — Patient Instructions (Signed)
You're to rashes appear to be contact dermatitis due to poison ivy. As it is not spreading, at this point you can use the triamcinolone cream 2-3 times per day  Over affected areas only. Okay to continue calamine lotion,  Other information as and handout below. If the rash spreads 2 other areas or involves face or genital area, start prednisone as prescribed. Return to the clinic or go to the nearest emergency room if any of your symptoms worsen or new symptoms occur.  Poison [employer] ivy is a inflammation of the skin (contact dermatitis) caused by touching the allergens on the leaves of the ivy plant following previous exposure to the plant. The rash usually appears 48 hours after exposure. The rash is usually bumps (papules) or blisters (vesicles) in a linear pattern. Depending on your own sensitivity, the rash may simply cause redness and itching, or it may also progress to blisters which may break open. These must be well cared for to prevent secondary bacterial (germ) infection, followed by scarring. Keep any open areas dry, clean, dressed, and covered with an antibacterial ointment if needed. The eyes may also get puffy. The puffiness is worst in the morning and gets better as the day progresses. This dermatitis usually heals without scarring, within 2 to 3 weeks without treatment. HOME CARE INSTRUCTIONS  Thoroughly wash with soap and water as soon as you have been exposed to poison ivy. You have about one half hour to remove the plant resin before it will cause the rash. This washing will destroy the oil or antigen on the skin that is causing, or will cause, the rash. Be sure to wash under your fingernails as any plant resin there will continue to spread the rash. Do not rub skin vigorously when washing affected area. Poison ivy cannot spread if no oil from the plant remains on your body. A rash that has progressed to weeping sores will not spread the rash unless you have not washed thoroughly. It is  also important to wash any clothes you have been wearing as these may carry active allergens. The rash will return if you wear the unwashed clothing, even several days later. Avoidance of the plant in the future is the best measure. Poison ivy plant can be recognized by the number of leaves. Generally, poison ivy has three leaves with flowering branches on a single stem. Diphenhydramine may be purchased over the counter and used as needed for itching. Do not drive with this medication if it makes you drowsy.Ask your caregiver about medication for children. SEEK MEDICAL CARE IF:  Open sores develop.  Redness spreads beyond area of rash.  You notice purulent (pus-like) discharge.  You have increased pain.  Other signs of infection develop (such as fever). Document Released: 04/17/2000 Document Revised: 07/13/2011 Document Reviewed: 09/28/2008 [facility] Patient Information 2015 [place], [place]. This information is not intended to replace advice given to you by your health care provider. Make sure you discuss any questions you have with your health care provider.

## 2015-03-03 ENCOUNTER — Emergency Department (HOSPITAL_COMMUNITY)
Admission: EM | Admit: 2015-03-03 | Discharge: 2015-03-03 | Disposition: A | Discharge status: Home / Self Care | Payer: Medicare HMO | Attending: Emergency Medicine | Admitting: Emergency Medicine

## 2015-03-03 ENCOUNTER — Encounter (HOSPITAL_COMMUNITY): Payer: Self-pay | Admitting: *Deleted

## 2015-03-03 ENCOUNTER — Emergency Department (HOSPITAL_COMMUNITY): Payer: Medicare HMO

## 2015-03-03 DIAGNOSIS — M25532 Pain in left wrist: Secondary | ICD-10-CM

## 2015-03-03 DIAGNOSIS — R202 Paresthesia of skin: Secondary | ICD-10-CM | POA: Diagnosis not present

## 2015-03-03 DIAGNOSIS — M79605 Pain in left leg: Secondary | ICD-10-CM | POA: Diagnosis not present

## 2015-03-03 DIAGNOSIS — Y92410 Unspecified street and highway as the place of occurrence of the external cause: Secondary | ICD-10-CM | POA: Insufficient documentation

## 2015-03-03 DIAGNOSIS — J45909 Unspecified asthma, uncomplicated: Secondary | ICD-10-CM | POA: Diagnosis not present

## 2015-03-03 DIAGNOSIS — R200 Anesthesia of skin: Secondary | ICD-10-CM | POA: Insufficient documentation

## 2015-03-03 DIAGNOSIS — I6789 Other cerebrovascular disease: Secondary | ICD-10-CM | POA: Diagnosis not present

## 2015-03-03 DIAGNOSIS — Y998 Other external cause status: Secondary | ICD-10-CM | POA: Insufficient documentation

## 2015-03-03 DIAGNOSIS — S8992XA Unspecified injury of left lower leg, initial encounter: Secondary | ICD-10-CM | POA: Insufficient documentation

## 2015-03-03 DIAGNOSIS — Z79899 Other long term (current) drug therapy: Secondary | ICD-10-CM | POA: Insufficient documentation

## 2015-03-03 DIAGNOSIS — M5430 Sciatica, unspecified side: Secondary | ICD-10-CM | POA: Insufficient documentation

## 2015-03-03 DIAGNOSIS — Z7952 Long term (current) use of systemic steroids: Secondary | ICD-10-CM | POA: Diagnosis not present

## 2015-03-03 DIAGNOSIS — S6992XA Unspecified injury of left wrist, hand and finger(s), initial encounter: Secondary | ICD-10-CM | POA: Insufficient documentation

## 2015-03-03 DIAGNOSIS — M79604 Pain in right leg: Secondary | ICD-10-CM | POA: Diagnosis not present

## 2015-03-03 DIAGNOSIS — Y9389 Activity, other specified: Secondary | ICD-10-CM | POA: Diagnosis not present

## 2015-03-03 DIAGNOSIS — M62838 Other muscle spasm: Secondary | ICD-10-CM | POA: Diagnosis not present

## 2015-03-03 DIAGNOSIS — M7989 Other specified soft tissue disorders: Secondary | ICD-10-CM | POA: Diagnosis not present

## 2015-03-03 MED ORDER — METHYLPREDNISOLONE 4 MG PO TBPK: ORAL_TABLET | ORAL | Status: DC

## 2015-03-03 MED ORDER — CYCLOBENZAPRINE HCL 10 MG PO TABS: 10.0000 mg | ORAL_TABLET | Freq: Two times a day (BID) | ORAL | Status: DC | PRN
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
## 2015-03-03 NOTE — ED Notes (Signed)
Pt not in room.

## 2015-03-03 NOTE — ED Notes (Signed)
Pt placed into gown and onto monitor upon arrival to room. Pt monitored by blood pressure and pulse ox. Pt family remains at bedside.
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
## 2015-03-11 ENCOUNTER — Encounter: Payer: Self-pay | Admitting: Family Medicine

## 2015-03-11 ENCOUNTER — Ambulatory Visit (INDEPENDENT_AMBULATORY_CARE_PROVIDER_SITE_OTHER): Payer: Medicare HMO

## 2015-03-11 ENCOUNTER — Ambulatory Visit (INDEPENDENT_AMBULATORY_CARE_PROVIDER_SITE_OTHER): Payer: Medicare HMO | Admitting: Family Medicine

## 2015-03-11 VITALS — BP 130/82 | HR 63 | Temp 98.0°F | Resp 16 | Ht 68.5 in | Wt 178.4 lb

## 2015-03-11 DIAGNOSIS — S63502D Unspecified sprain of left wrist, subsequent encounter: Secondary | ICD-10-CM

## 2015-03-11 DIAGNOSIS — M5440 Lumbago with sciatica, unspecified side: Secondary | ICD-10-CM

## 2015-03-11 DIAGNOSIS — M6248 Contracture of muscle, other site: Secondary | ICD-10-CM | POA: Diagnosis not present

## 2015-03-11 DIAGNOSIS — M62838 Other muscle spasm: Secondary | ICD-10-CM
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
## 2015-03-19 ENCOUNTER — Ambulatory Visit (INDEPENDENT_AMBULATORY_CARE_PROVIDER_SITE_OTHER): Payer: Medicare HMO

## 2015-03-19 ENCOUNTER — Ambulatory Visit (INDEPENDENT_AMBULATORY_CARE_PROVIDER_SITE_OTHER): Payer: Medicare HMO | Admitting: Family Medicine

## 2015-03-19 VITALS — BP 138/80 | HR 62 | Temp 97.8°F | Resp 18 | Ht 68.5 in | Wt 177.0 lb

## 2015-03-19 DIAGNOSIS — M25532 Pain in left wrist: Secondary | ICD-10-CM

## 2015-03-19 DIAGNOSIS — M5441 Lumbago with sciatica, right side: Secondary | ICD-10-CM

## 2015-03-19 DIAGNOSIS — M654 Radial styloid tenosynovitis [de Quervain]: Secondary | ICD-10-CM

## 2015-03-19 DIAGNOSIS — M5442 Lumbago with sciatica, left side: Secondary | ICD-10-CM

## 2015-03-19 DIAGNOSIS — M4316 Spondylolisthesis, lumbar region: Secondary | ICD-10-CM | POA: Diagnosis not present

## 2015-03-19 NOTE — Patient Instructions (Addendum)
I will refer you to ortho for your back and wrist, as well as schedule MRI. Ok to continue brace and Mobic for now. Should avoid use of that wrist and avoid lifting, twisting/turning and bending until MRI and evaluated by ortho.   Return to the clinic or go to the nearest emergency room if any of your symptoms worsen or new symptoms occur.
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
## 2015-04-03 DIAGNOSIS — M5106 Intervertebral disc disorders with myelopathy, lumbar region: Secondary | ICD-10-CM | POA: Diagnosis not present

## 2015-04-03 DIAGNOSIS — S52502A Unspecified fracture of the lower end of left radius, initial encounter for closed fracture: Secondary | ICD-10-CM | POA: Diagnosis not present

## 2015-04-09 DIAGNOSIS — M545 Low back pain: Secondary | ICD-10-CM | POA: Diagnosis not present

## 2015-04-11 ENCOUNTER — Telehealth: Payer: Self-pay

## 2015-04-11 DIAGNOSIS — M4806 Spinal stenosis, lumbar region: Secondary | ICD-10-CM | POA: Diagnosis not present

## 2015-04-11 NOTE — Telephone Encounter (Signed)
Waiting on payment of $18.75 for 25 pages from [employer].

## 2015-04-15 DIAGNOSIS — M5106 Intervertebral disc disorders with myelopathy, lumbar region: Secondary | ICD-10-CM | POA: Diagnosis not present

## 2015-04-15 DIAGNOSIS — M4316 Spondylolisthesis, lumbar region: Secondary | ICD-10-CM | POA: Diagnosis not present

## 2015-04-15 DIAGNOSIS — M545 Low back pain: Secondary | ICD-10-CM | POA: Diagnosis not present

## 2015-04-15 DIAGNOSIS — M62830 Muscle spasm of back: Secondary | ICD-10-CM | POA: Diagnosis not present

## 2015-04-17 DIAGNOSIS — M5106 Intervertebral disc disorders with myelopathy, lumbar region: Secondary | ICD-10-CM | POA: Diagnosis not present

## 2015-04-17 DIAGNOSIS — M4316 Spondylolisthesis, lumbar region: Secondary | ICD-10-CM | POA: Diagnosis not present

## 2015-04-17 DIAGNOSIS — M545 Low back pain: Secondary | ICD-10-CM | POA: Diagnosis not present

## 2015-04-22 DIAGNOSIS — M545 Low back pain: Secondary | ICD-10-CM | POA: Diagnosis not present

## 2015-04-22 DIAGNOSIS — M62830 Muscle spasm of back: Secondary | ICD-10-CM | POA: Diagnosis not present

## 2015-04-22 DIAGNOSIS — M5106 Intervertebral disc disorders with myelopathy, lumbar region: Secondary | ICD-10-CM | POA: Diagnosis not present

## 2015-04-22 DIAGNOSIS — M4316 Spondylolisthesis, lumbar region: Secondary | ICD-10-CM | POA: Diagnosis not present

## 2015-04-23 NOTE — Telephone Encounter (Signed)
Called [employer] they stated they never received the invoices that I faxed over on 11/30/ & 12/8 she gave my a new number to send it to, so I refaxed the letter to them. Will wait on payment before sending records.

## 2015-04-25 DIAGNOSIS — M5106 Intervertebral disc disorders with myelopathy, lumbar region: Secondary | ICD-10-CM | POA: Diagnosis not present

## 2015-04-25 DIAGNOSIS — M4316 Spondylolisthesis, lumbar region: Secondary | ICD-10-CM | POA: Diagnosis not present

## 2015-05-01 DIAGNOSIS — M545 Low back pain: Secondary | ICD-10-CM | POA: Diagnosis not present

## 2015-05-01 DIAGNOSIS — M4316 Spondylolisthesis, lumbar region: Secondary | ICD-10-CM | POA: Diagnosis not present

## 2015-05-01 DIAGNOSIS — M62830 Muscle spasm of back: Secondary | ICD-10-CM | POA: Diagnosis not present

## 2015-05-01 DIAGNOSIS — M5106 Intervertebral disc disorders with myelopathy, lumbar region: Secondary | ICD-10-CM | POA: Diagnosis not present

## 2015-05-03 DIAGNOSIS — M4316 Spondylolisthesis, lumbar region: Secondary | ICD-10-CM | POA: Diagnosis not present

## 2015-05-03 DIAGNOSIS — M545 Low back pain: Secondary | ICD-10-CM | POA: Diagnosis not present

## 2015-05-03 DIAGNOSIS — M5106 Intervertebral disc disorders with myelopathy, lumbar region: Secondary | ICD-10-CM | POA: Diagnosis not present

## 2015-05-03 DIAGNOSIS — M62830 Muscle spasm of back: Secondary | ICD-10-CM | POA: Diagnosis not present

## 2015-05-07 DIAGNOSIS — M545 Low back pain: Secondary | ICD-10-CM | POA: Diagnosis not present

## 2015-05-07 DIAGNOSIS — M62830 Muscle spasm of back: Secondary | ICD-10-CM | POA: Diagnosis not present

## 2015-05-07 DIAGNOSIS — M5106 Intervertebral disc disorders with myelopathy, lumbar region: Secondary | ICD-10-CM | POA: Diagnosis not present

## 2015-05-07 DIAGNOSIS — M4316 Spondylolisthesis, lumbar region: Secondary | ICD-10-CM | POA: Diagnosis not present

## 2015-05-07 NOTE — Telephone Encounter (Signed)
refaxed letter for payment  05/07/15

## 2015-05-10 DIAGNOSIS — M545 Low back pain: Secondary | ICD-10-CM | POA: Diagnosis not present

## 2015-05-10 DIAGNOSIS — M4316 Spondylolisthesis, lumbar region: Secondary | ICD-10-CM | POA: Diagnosis not present

## 2015-05-10 DIAGNOSIS — M5106 Intervertebral disc disorders with myelopathy, lumbar region: Secondary | ICD-10-CM | POA: Diagnosis not present

## 2015-05-10 DIAGNOSIS — Z0271 Encounter for disability determination: Secondary | ICD-10-CM

## 2015-05-10 DIAGNOSIS — M62830 Muscle spasm of back: Secondary | ICD-10-CM | POA: Diagnosis not present

## 2015-05-10 NOTE — Telephone Encounter (Signed)
Payment received and records sent on 05/10/15

## 2015-05-14 ENCOUNTER — Other Ambulatory Visit: Payer: Self-pay | Admitting: Family Medicine

## 2015-05-14 DIAGNOSIS — M62830 Muscle spasm of back: Secondary | ICD-10-CM | POA: Diagnosis not present

## 2015-05-14 DIAGNOSIS — M545 Low back pain: Secondary | ICD-10-CM | POA: Diagnosis not present

## 2015-05-14 DIAGNOSIS — M4316 Spondylolisthesis, lumbar region: Secondary | ICD-10-CM | POA: Diagnosis not present

## 2015-05-14 DIAGNOSIS — M5106 Intervertebral disc disorders with myelopathy, lumbar region: Secondary | ICD-10-CM | POA: Diagnosis not present

## 2015-05-15 ENCOUNTER — Telehealth: Payer: Self-pay

## 2015-05-15 DIAGNOSIS — R059 Cough, unspecified: Secondary | ICD-10-CM

## 2015-05-15 DIAGNOSIS — R062 Wheezing: Secondary | ICD-10-CM

## 2015-05-15 DIAGNOSIS — R05 Cough: Secondary | ICD-10-CM

## 2015-05-15 MED ORDER — ALBUTEROL SULFATE HFA 108 (90 BASE) MCG/ACT IN AERS: 1.0000 | INHALATION_SPRAY | RESPIRATORY_TRACT | Status: DC | PRN

## 2015-05-15 NOTE — Telephone Encounter (Signed)
Pt would like a refill on his albuterol (PROVENTIL HFA;VENTOLIN HFA) 108 (90 BASE) MCG/ACT inhaler [id number]. I let him know he needed to be seen for a refill, he still wanted me to put a message in. CB [phone]

## 2015-05-15 NOTE — Telephone Encounter (Signed)
Pt.notified

## 2015-05-15 NOTE — Telephone Encounter (Signed)
Needs OV. Needs PA approval

## 2015-05-15 NOTE — Telephone Encounter (Signed)
I'm fine with refilling without OV. He has not been seen for his asthma in 11 months but he has been seen here for other issues, most recently 03/2015. Please advise patient he is due for a physical and blood work and offer him an appointment.

## 2015-05-16 DIAGNOSIS — M5106 Intervertebral disc disorders with myelopathy, lumbar region: Secondary | ICD-10-CM | POA: Diagnosis not present

## 2015-05-16 DIAGNOSIS — M62830 Muscle spasm of back: Secondary | ICD-10-CM | POA: Diagnosis not present

## 2015-05-16 DIAGNOSIS — M545 Low back pain: Secondary | ICD-10-CM | POA: Diagnosis not present

## 2015-05-16 DIAGNOSIS — M4316 Spondylolisthesis, lumbar region: Secondary | ICD-10-CM | POA: Diagnosis not present

## 2015-05-21 DIAGNOSIS — M5106 Intervertebral disc disorders with myelopathy, lumbar region: Secondary | ICD-10-CM | POA: Diagnosis not present

## 2015-05-21 DIAGNOSIS — M62830 Muscle spasm of back: Secondary | ICD-10-CM | POA: Diagnosis not present

## 2015-05-21 DIAGNOSIS — M545 Low back pain: Secondary | ICD-10-CM | POA: Diagnosis not present

## 2015-05-21 DIAGNOSIS — M4316 Spondylolisthesis, lumbar region: Secondary | ICD-10-CM | POA: Diagnosis not present

## 2015-05-22 DIAGNOSIS — M5106 Intervertebral disc disorders with myelopathy, lumbar region: Secondary | ICD-10-CM | POA: Diagnosis not present

## 2015-05-22 DIAGNOSIS — M545 Low back pain: Secondary | ICD-10-CM | POA: Diagnosis not present

## 2015-05-22 DIAGNOSIS — M4316 Spondylolisthesis, lumbar region: Secondary | ICD-10-CM | POA: Diagnosis not present

## 2015-05-28 DIAGNOSIS — M62830 Muscle spasm of back: Secondary | ICD-10-CM | POA: Diagnosis not present

## 2015-05-28 DIAGNOSIS — M4316 Spondylolisthesis, lumbar region: Secondary | ICD-10-CM | POA: Diagnosis not present

## 2015-05-28 DIAGNOSIS — M545 Low back pain: Secondary | ICD-10-CM | POA: Diagnosis not present

## 2015-05-31 DIAGNOSIS — M4316 Spondylolisthesis, lumbar region: Secondary | ICD-10-CM | POA: Diagnosis not present

## 2015-05-31 DIAGNOSIS — M5106 Intervertebral disc disorders with myelopathy, lumbar region: Secondary | ICD-10-CM | POA: Diagnosis not present

## 2015-05-31 DIAGNOSIS — M545 Low back pain: Secondary | ICD-10-CM | POA: Diagnosis not present

## 2015-05-31 DIAGNOSIS — M62830 Muscle spasm of back: Secondary | ICD-10-CM | POA: Diagnosis not present

## 2015-06-04 DIAGNOSIS — M5106 Intervertebral disc disorders with myelopathy, lumbar region: Secondary | ICD-10-CM | POA: Diagnosis not present

## 2015-06-04 DIAGNOSIS — M62830 Muscle spasm of back: Secondary | ICD-10-CM | POA: Diagnosis not present

## 2015-06-04 DIAGNOSIS — M4316 Spondylolisthesis, lumbar region: Secondary | ICD-10-CM | POA: Diagnosis not present

## 2015-06-04 DIAGNOSIS — M545 Low back pain: Secondary | ICD-10-CM | POA: Diagnosis not present

## 2015-06-07 DIAGNOSIS — M62830 Muscle spasm of back: Secondary | ICD-10-CM | POA: Diagnosis not present

## 2015-06-07 DIAGNOSIS — M4316 Spondylolisthesis, lumbar region: Secondary | ICD-10-CM | POA: Diagnosis not present

## 2015-06-07 DIAGNOSIS — M5106 Intervertebral disc disorders with myelopathy, lumbar region: Secondary | ICD-10-CM | POA: Diagnosis not present

## 2015-06-07 DIAGNOSIS — M545 Low back pain: Secondary | ICD-10-CM | POA: Diagnosis not present

## 2015-06-11 DIAGNOSIS — M4316 Spondylolisthesis, lumbar region: Secondary | ICD-10-CM | POA: Diagnosis not present

## 2015-06-11 DIAGNOSIS — M5106 Intervertebral disc disorders with myelopathy, lumbar region: Secondary | ICD-10-CM | POA: Diagnosis not present

## 2015-06-11 DIAGNOSIS — M62830 Muscle spasm of back: Secondary | ICD-10-CM | POA: Diagnosis not present

## 2015-06-11 DIAGNOSIS — M545 Low back pain: Secondary | ICD-10-CM | POA: Diagnosis not present

## 2015-06-13 DIAGNOSIS — M5106 Intervertebral disc disorders with myelopathy, lumbar region: Secondary | ICD-10-CM | POA: Diagnosis not present

## 2015-06-13 DIAGNOSIS — M4316 Spondylolisthesis, lumbar region: Secondary | ICD-10-CM | POA: Diagnosis not present

## 2015-06-18 DIAGNOSIS — M62830 Muscle spasm of back: Secondary | ICD-10-CM | POA: Diagnosis not present

## 2015-06-18 DIAGNOSIS — M545 Low back pain: Secondary | ICD-10-CM | POA: Diagnosis not present

## 2015-06-18 DIAGNOSIS — M4316 Spondylolisthesis, lumbar region: Secondary | ICD-10-CM | POA: Diagnosis not present

## 2015-06-18 DIAGNOSIS — M5106 Intervertebral disc disorders with myelopathy, lumbar region: Secondary | ICD-10-CM | POA: Diagnosis not present

## 2015-06-25 DIAGNOSIS — M545 Low back pain: Secondary | ICD-10-CM | POA: Diagnosis not present

## 2015-06-25 DIAGNOSIS — M62830 Muscle spasm of back: Secondary | ICD-10-CM | POA: Diagnosis not present

## 2015-06-25 DIAGNOSIS — M4316 Spondylolisthesis, lumbar region: Secondary | ICD-10-CM | POA: Diagnosis not present

## 2015-06-25 DIAGNOSIS — M5106 Intervertebral disc disorders with myelopathy, lumbar region: Secondary | ICD-10-CM | POA: Diagnosis not present

## 2015-07-01 DIAGNOSIS — M4316 Spondylolisthesis, lumbar region: Secondary | ICD-10-CM | POA: Diagnosis not present

## 2015-07-01 DIAGNOSIS — M5106 Intervertebral disc disorders with myelopathy, lumbar region: Secondary | ICD-10-CM | POA: Diagnosis not present

## 2015-07-01 DIAGNOSIS — M545 Low back pain: Secondary | ICD-10-CM | POA: Diagnosis not present

## 2015-07-01 DIAGNOSIS — M62830 Muscle spasm of back: Secondary | ICD-10-CM | POA: Diagnosis not present

## 2015-07-10 DIAGNOSIS — M545 Low back pain: Secondary | ICD-10-CM | POA: Diagnosis not present

## 2015-07-10 DIAGNOSIS — M5106 Intervertebral disc disorders with myelopathy, lumbar region: Secondary | ICD-10-CM | POA: Diagnosis not present

## 2015-07-10 DIAGNOSIS — M4316 Spondylolisthesis, lumbar region: Secondary | ICD-10-CM | POA: Diagnosis not present

## 2015-07-10 DIAGNOSIS — M62830 Muscle spasm of back: Secondary | ICD-10-CM | POA: Diagnosis not present

## 2015-07-16 DIAGNOSIS — M545 Low back pain: Secondary | ICD-10-CM | POA: Diagnosis not present

## 2015-07-16 DIAGNOSIS — M4316 Spondylolisthesis, lumbar region: Secondary | ICD-10-CM | POA: Diagnosis not present

## 2015-07-16 DIAGNOSIS — M5106 Intervertebral disc disorders with myelopathy, lumbar region: Secondary | ICD-10-CM | POA: Diagnosis not present

## 2015-07-16 DIAGNOSIS — M62830 Muscle spasm of back: Secondary | ICD-10-CM | POA: Diagnosis not present

## 2015-07-23 DIAGNOSIS — M545 Low back pain: Secondary | ICD-10-CM | POA: Diagnosis not present

## 2015-07-23 DIAGNOSIS — M4316 Spondylolisthesis, lumbar region: Secondary | ICD-10-CM | POA: Diagnosis not present

## 2015-07-23 DIAGNOSIS — M62830 Muscle spasm of back: Secondary | ICD-10-CM | POA: Diagnosis not present

## 2015-07-23 DIAGNOSIS — M5106 Intervertebral disc disorders with myelopathy, lumbar region: Secondary | ICD-10-CM | POA: Diagnosis not present

## 2015-08-01 DIAGNOSIS — M545 Low back pain: Secondary | ICD-10-CM | POA: Diagnosis not present

## 2015-08-01 DIAGNOSIS — M62830 Muscle spasm of back: Secondary | ICD-10-CM | POA: Diagnosis not present

## 2015-08-01 DIAGNOSIS — M5106 Intervertebral disc disorders with myelopathy, lumbar region: Secondary | ICD-10-CM | POA: Diagnosis not present

## 2015-08-01 DIAGNOSIS — M4316 Spondylolisthesis, lumbar region: Secondary | ICD-10-CM | POA: Diagnosis not present

## 2015-08-06 DIAGNOSIS — M545 Low back pain: Secondary | ICD-10-CM | POA: Diagnosis not present

## 2015-08-06 DIAGNOSIS — M62830 Muscle spasm of back: Secondary | ICD-10-CM | POA: Diagnosis not present

## 2015-08-06 DIAGNOSIS — M4316 Spondylolisthesis, lumbar region: Secondary | ICD-10-CM | POA: Diagnosis not present

## 2015-08-06 DIAGNOSIS — M5106 Intervertebral disc disorders with myelopathy, lumbar region: Secondary | ICD-10-CM | POA: Diagnosis not present

## 2015-08-07 DIAGNOSIS — M5106 Intervertebral disc disorders with myelopathy, lumbar region: Secondary | ICD-10-CM | POA: Diagnosis not present

## 2015-09-05 ENCOUNTER — Telehealth: Payer: Self-pay | Admitting: Family Medicine

## 2015-09-05 DIAGNOSIS — N281 Cyst of kidney, acquired: Secondary | ICD-10-CM

## 2015-09-05 NOTE — Telephone Encounter (Signed)
Please call patient. I received some notes and a phone call from Dr. Layne Benton approximately 5 months ago, with plan to follow-up with me for discussion of a renal cyst that was noted on an MRI scan. Plan was to follow-up with me to perform ultrasound.   If he has not had this evaluated elsewhere, recommend following up with me by appointment or walk in to discuss ultrasound or further workup.Let me know if there are any questions.

## 2015-09-06 NOTE — Telephone Encounter (Signed)
Left message for pt to call back  °

## 2015-09-09 NOTE — Telephone Encounter (Signed)
Spoke with pt, advised pt. He will come in to see Dr. Carlota Raspberry.

## 2016-05-11 ENCOUNTER — Ambulatory Visit (INDEPENDENT_AMBULATORY_CARE_PROVIDER_SITE_OTHER): Payer: Medicare HMO | Admitting: Family Medicine

## 2016-05-11 VITALS — BP 126/82 | HR 64 | Temp 97.7°F | Resp 16 | Ht 68.5 in | Wt 175.0 lb

## 2016-05-11 DIAGNOSIS — N281 Cyst of kidney, acquired: Secondary | ICD-10-CM | POA: Diagnosis not present

## 2016-05-11 DIAGNOSIS — Z23 Encounter for immunization: Secondary | ICD-10-CM

## 2016-05-11 DIAGNOSIS — L723 Sebaceous cyst: Secondary | ICD-10-CM

## 2016-05-11 LAB — POC MICROSCOPIC URINALYSIS (UMFC): Mucus: ABSENT

## 2016-05-11 LAB — POCT URINALYSIS DIP (MANUAL ENTRY)
Bilirubin, UA: NEGATIVE
Blood, UA: NEGATIVE
Glucose, UA: NEGATIVE
Ketones, POC UA: NEGATIVE
Leukocytes, UA: NEGATIVE
Nitrite, UA: NEGATIVE
Protein Ur, POC: NEGATIVE
Spec Grav, UA: 1.025
Urobilinogen, UA: 0.2
pH, UA: 5.5
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
## 2016-05-11 NOTE — Patient Instructions (Addendum)
Flu vaccine given today.  I will schedule an ultrasound to evaluate the renal cyst further.  The area on the buttocks appears to be a sebaceous cyst. See information on that below. If you have any redness, soreness, or discharge from that area, return for procedure to remove that cyst. This can also be done electively in the future if you would like.  Follow-up for physical within the next few months. Let me know if you have questions in the meantime.   IF you received an x-ray today, you will receive an invoice from [facility]. Please contact [facility] at [phone] with questions or concerns regarding your invoice.   IF you received labwork today, you will receive an invoice from [place]. Please contact [facility] at [phone] with questions or concerns regarding your invoice.   Our billing staff will not be able to assist you with questions regarding bills from these companies.  You will be contacted with the lab results as soon as they are available. The fastest way to get your results is to activate your My Chart account. Instructions are located on the last page of this paperwork. If you have not heard from [place] regarding the results in 2 weeks, please contact this office.    Epidermal Cyst An epidermal cyst is sometimes called a sebaceous cyst, epidermal inclusion cyst, or infundibular cyst. These cysts usually contain a substance that looks "pasty" or "cheesy" and may have a bad smell. This substance is a protein called keratin. Epidermal cysts are usually found on the face, neck, or trunk. They may also occur in the vaginal area or other parts of the genitalia of both men and women. Epidermal cysts are usually small, painless, slow-growing bumps or lumps that move freely under the skin. It is important not to try to pop them. This may cause an infection and lead to tenderness and swelling. CAUSES  Epidermal cysts may be caused by a deep penetrating injury to the  skin or a plugged hair follicle, often associated with acne. SYMPTOMS  Epidermal cysts can become inflamed and cause:  Redness.  Tenderness.  Increased temperature of the skin over the bumps or lumps.  Grayish-white, bad smelling material that drains from the bump or lump. DIAGNOSIS  Epidermal cysts are easily diagnosed by your caregiver during an exam. Rarely, a tissue sample (biopsy) may be taken to rule out other conditions that may resemble epidermal cysts. TREATMENT   Epidermal cysts often get better and disappear on their own. They are rarely ever cancerous.  If a cyst becomes infected, it may become inflamed and tender. This may require opening and draining the cyst. Treatment with antibiotics may be necessary. When the infection is gone, the cyst may be removed with minor surgery.  Small, inflamed cysts can often be treated with antibiotics or by injecting steroid medicines.  Sometimes, epidermal cysts become large and bothersome. If this happens, surgical removal in your caregiver's office may be necessary. HOME CARE INSTRUCTIONS  Only take over-the-counter or prescription medicines as directed by your caregiver.  Take your antibiotics as directed. Finish them even if you start to feel better. SEEK MEDICAL CARE IF:   Your cyst becomes tender, red, or swollen.  Your condition is not improving or is getting worse.  You have any other questions or concerns. MAKE SURE YOU:  Understand these instructions.  Will watch your condition.  Will get help right away if you are not doing well or get worse. This information is not intended  to replace advice given to you by your health care provider. Make sure you discuss any questions you have with your health care provider. Document Released: 03/21/2004 Document Revised: 07/13/2011 Document Reviewed: 02/20/2015 Elsevier Interactive Patient Education  2017 [employer].

## 2016-05-12 ENCOUNTER — Ambulatory Visit
Admission: RE | Admit: 2016-05-12 | Discharge: 2016-05-12 | Disposition: A | Discharge status: Home / Self Care | Payer: Medicare HMO | Source: Ambulatory Visit | Attending: Family Medicine | Admitting: Family Medicine

## 2016-05-12 DIAGNOSIS — N281 Cyst of kidney, acquired: Secondary | ICD-10-CM | POA: Diagnosis not present

## 2016-05-26 NOTE — Addendum Note (Signed)
Addended by: [name] R on: 05/26/2016 07:15 PM   Modules accepted: Orders

## 2016-06-15 DIAGNOSIS — N281 Cyst of kidney, acquired: Secondary | ICD-10-CM | POA: Diagnosis not present

## 2016-06-16 DIAGNOSIS — M654 Radial styloid tenosynovitis [de Quervain]: Secondary | ICD-10-CM | POA: Diagnosis not present

## 2016-06-16 DIAGNOSIS — G5601 Carpal tunnel syndrome, right upper limb: Secondary | ICD-10-CM | POA: Diagnosis not present

## 2016-06-17 DIAGNOSIS — M25531 Pain in right wrist: Secondary | ICD-10-CM | POA: Diagnosis not present

## 2016-06-22 ENCOUNTER — Other Ambulatory Visit: Payer: Self-pay | Admitting: *Deleted

## 2016-06-22 DIAGNOSIS — G5601 Carpal tunnel syndrome, right upper limb: Secondary | ICD-10-CM

## 2016-06-25 ENCOUNTER — Ambulatory Visit (INDEPENDENT_AMBULATORY_CARE_PROVIDER_SITE_OTHER): Payer: Medicare HMO | Admitting: Neurology

## 2016-06-25 DIAGNOSIS — G5601 Carpal tunnel syndrome, right upper limb: Secondary | ICD-10-CM | POA: Diagnosis not present

## 2016-06-25 NOTE — Procedures (Signed)
[facility]  [address]  [place], [place] [address] Tel: [phone] Fax:  [phone] Test Date:  06/25/2016  Patient: Alan Ball DOB: 11/13/1951 Physician: Narda Amber, DO  Sex: Male Height: 5\' 9"  Ref Phys: Berle Mull, M.D.  ID#: [id number] Temp: 33.2C Technician: Jerilynn Mages. Dean   Patient Complaints: This is a 66 year old gentleman referred for evaluation of right hand numbness and tingling.  NCV & EMG Findings: Extensive electrodiagnostic testing of the right upper extremity shows:  1. Right median sensory response is absent. Right ulnar sensory response is within normal limits. 2. Right median motor response shows prolonged latency (5.3 ms) and markedly reduced amplitude (0.1 mV).  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  Right ulnar motor responses within normal limits. 3. 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  Impression: 1. Right median neuropathy at or distal to the wrist, consistent with the clinical diagnosis of carpal tunnel syndrome. Overall, these findings are very severe in degree electrically. 2. Incidentally, there is a right Martin-Gruber anastomosis, a normal variant.   ___________________________ Narda Amber, DO    Nerve Conduction Studies Anti Sensory Summary Table   Site NR Peak (ms) Norm Peak (ms) P-T Amp (V) Norm P-T Amp  Right Median Anti Sensory (2nd Digit)  Wrist NR  <3.8  >10  Right Ulnar Anti Sensory (5th Digit)  Wrist    3.0 <3.2 11.8 >5   Motor Summary Table   Site NR Onset (ms) Norm Onset (ms) O-P Amp (mV) Norm O-P Amp Site1 Site2 Delta-0 (ms) Dist (cm) Vel (m/s) Norm Vel (m/s)  Right Median Motor (Abd Poll Brev)  Wrist    5.3 <4.0 0.1 >5 Elbow Wrist 2.7 23.0 85 >50  Elbow     8.0  1.1  Axilla Elbow 3.7 0.0    Axilla    4.3  2.7         Right Ulnar Motor (Abd Dig Minimi)  Wrist    2.8 <3.1 8.7 >7 B Elbow Wrist 3.5 21.0 60 >50  B Elbow    6.3  7.5  A Elbow B Elbow 1.7 10.0 59 >50  A Elbow    8.0  7.3          EMG   Side Muscle Ins Act Fibs Psw Fasc Number Recrt Dur Dur. Amp Amp. Poly Poly. Comment  Right 1stDorInt Nml Nml Nml Nml Nml Nml Nml Nml Nml Nml Nml Nml N/A  Right Abd Poll Brev Nml Nml Nml Nml 2- Rapid Many 1+ Many 1+ Nml Nml N/A  Right Ext Indicis Nml Nml Nml Nml Nml Nml Nml Nml Nml Nml Nml Nml N/A  Right PronatorTeres Nml Nml Nml Nml Nml Nml Nml Nml Nml Nml Nml Nml N/A  Right Biceps Nml Nml Nml Nml Nml Nml Nml Nml Nml Nml Nml Nml N/A  Right Triceps Nml Nml Nml Nml Nml Nml Nml Nml Nml Nml Nml Nml N/A  Right Deltoid Nml Nml Nml Nml Nml Nml Nml Nml Nml Nml Nml Nml N/A      Waveforms:

## 2016-08-13 ENCOUNTER — Encounter: Payer: Self-pay | Admitting: Family Medicine

## 2016-08-13 ENCOUNTER — Ambulatory Visit (INDEPENDENT_AMBULATORY_CARE_PROVIDER_SITE_OTHER): Payer: Medicare HMO | Admitting: Family Medicine

## 2016-08-13 VITALS — BP 140/85 | HR 83 | Temp 97.4°F | Resp 16 | Ht 68.0 in | Wt 176.0 lb

## 2016-08-13 DIAGNOSIS — Z131 Encounter for screening for diabetes mellitus: Secondary | ICD-10-CM

## 2016-08-13 DIAGNOSIS — J4520 Mild intermittent asthma, uncomplicated: Secondary | ICD-10-CM | POA: Diagnosis not present

## 2016-08-13 DIAGNOSIS — H9313 Tinnitus, bilateral: Secondary | ICD-10-CM | POA: Diagnosis not present

## 2016-08-13 DIAGNOSIS — Z125 Encounter for screening for malignant neoplasm of prostate: Secondary | ICD-10-CM | POA: Diagnosis not present

## 2016-08-13 DIAGNOSIS — Z1159 Encounter for screening for other viral diseases: Secondary | ICD-10-CM | POA: Diagnosis not present

## 2016-08-13 DIAGNOSIS — Z0000 Encounter for general adult medical examination without abnormal findings: Secondary | ICD-10-CM | POA: Diagnosis not present

## 2016-08-13 DIAGNOSIS — Z13220 Encounter for screening for lipoid disorders: Secondary | ICD-10-CM

## 2016-08-13 MED ORDER — ALBUTEROL SULFATE HFA 108 (90 BASE) MCG/ACT IN AERS: 1.0000 | INHALATION_SPRAY | RESPIRATORY_TRACT | 0 refills | Status: DC | PRN

## 2016-08-13 NOTE — Patient Instructions (Addendum)
If ringing in ears worsen - return to discuss further.   For wheezing - ok to use albuterol if only rare use. If needed more than twice per week or any nighttime symptoms that wake you up - return to discuss further. Dust mask as needed.   Keeping you healthy  Get these tests  Blood pressure- Have your blood pressure checked once a year by your healthcare provider.  Normal blood pressure is 120/80  Weight- Have your body mass index (BMI) calculated to screen for obesity.  BMI is a measure of body fat based on height and weight. You can also calculate your own BMI at [email or web address].[email or web address].  Cholesterol- Have your cholesterol checked every year.  Diabetes- Have your blood sugar checked regularly if you have high blood pressure, high cholesterol, have a family history of diabetes or if you are overweight.  Screening for Colon Cancer- Colonoscopy starting at age 23.  Screening may begin sooner depending on your family history and other health conditions. Follow up colonoscopy as directed by your Gastroenterologist.  Screening for Prostate Cancer- Both blood work (PSA) and a rectal exam help screen for Prostate Cancer.  Screening begins at age 66 with African-American men and at age 66 with Caucasian men.  Screening may begin sooner depending on your family history.  Take these medicines  Aspirin- One aspirin daily can help prevent Heart disease and Stroke.  Flu shot- Every fall.  Tetanus- Every 10 years.  Zostavax- Once after the age of 32 to prevent Shingles.  Pneumonia shot- Once after the age of 50; if you are younger than 59, ask your healthcare provider if you need a Pneumonia shot.  Take these steps  Don't smoke- If you do smoke, talk to your doctor about quitting.  For tips on how to quit, go to www.smokefree.gov or call 1-800-QUIT-NOW.  Be physically active- Exercise 5 days a week for at least 30 minutes.  If you are not already physically active start slow and gradually  work up to 30 minutes of moderate physical activity.  Examples of moderate activity include walking briskly, mowing the yard, dancing, swimming, bicycling, etc.  Eat a healthy diet- Eat a variety of healthy food such as fruits, vegetables, low fat milk, low fat cheese, yogurt, lean meant, poultry, fish, beans, tofu, etc. For more information go to www.thenutritionsource.org  Drink alcohol in moderation- Limit alcohol intake to less than two drinks a day. Never drink and drive.  Dentist- Brush and floss twice daily; visit your dentist twice a year.  Depression- Your emotional health is as important as your physical health. If you're feeling down, or losing interest in things you would normally enjoy please talk to your healthcare provider.  Eye exam- Visit your eye doctor every year.  Safe sex- If you may be exposed to a sexually transmitted infection, use a condom.  Seat belts- Seat belts can save your life; always wear one.  Smoke/Carbon Monoxide detectors- These detectors need to be installed on the appropriate level of your home.  Replace batteries at least once a year.  Skin cancer- When out in the sun, cover up and use sunscreen 15 SPF or higher.  Violence- If anyone is threatening you, please tell your healthcare provider.  Living Will/ Health care power of attorney- Speak with your healthcare provider and family.  Tinnitus Tinnitus refers to hearing a sound when there is no actual source for that sound. This is often described as ringing in the ears. However, people with this  condition may hear a variety of noises. A person may hear the sound in one ear or in both ears. The sounds of tinnitus can be soft, loud, or somewhere in between. Tinnitus can last for a few seconds or can be constant for days. It may go away without treatment and come back at various times. When tinnitus is constant or happens often, it can lead to other problems, such as trouble sleeping and trouble  concentrating. Almost everyone experiences tinnitus at some point. Tinnitus that is long-lasting (chronic) or comes back often is a problem that may require medical attention. What are the causes? The cause of tinnitus is often not known. In some cases, it can result from other problems or conditions, including:  Exposure to loud noises from machinery, music, or other sources.  Hearing loss.  Ear or sinus infections.  Earwax buildup.  A foreign object in the ear.  Use of certain medicines.  Use of alcohol and caffeine.  High blood pressure.  Heart diseases.  Anemia.  Allergies.  Meniere disease.  Thyroid problems.  Tumors.  An enlarged part of a weakened blood vessel (aneurysm). What are the signs or symptoms? The main symptom of tinnitus is hearing a sound when there is no source for that sound. It may sound like:  Buzzing.  Roaring.  Ringing.  Blowing air, similar to the sound heard when you listen to a seashell.  Hissing.  Whistling.  Sizzling.  Humming.  Running water.  A sustained musical note. How is this diagnosed? Tinnitus is diagnosed based on your symptoms. Your health care provider will do a physical exam. A comprehensive hearing exam (audiologic exam) will be done if your tinnitus:  Affects only one ear (unilateral).  Causes hearing difficulties.  Lasts 6 months or longer. You may also need to see a health care provider who specializes in hearing disorders (audiologist). You may be asked to complete a questionnaire to determine the severity of your tinnitus. Tests may be done to help determine the cause and to rule out other conditions. These can include:  Imaging studies of your head and brain, such as:  A CT scan.  An MRI.  An imaging study of your blood vessels (angiogram). How is this treated? Treating an underlying medical condition can sometimes make tinnitus go away. If your tinnitus continues, other treatments may  include:  Medicines, such as certain antidepressants or sleeping aids.  Sound generators to mask the tinnitus. These include:  Tabletop sound machines that play relaxing sounds to help you fall asleep.  Wearable devices that fit in your ear and play sounds or music.  A small device that uses headphones to deliver a signal embedded in music (acoustic neural stimulation). In time, this may change the pathways of your brain and make you less sensitive to tinnitus. This device is used for very severe cases when no other treatment is working.  Therapy and counseling to help you manage the stress of living with tinnitus.  Using hearing aids or cochlear implants, if your tinnitus is related to hearing loss. Follow these instructions at home:  When possible, avoid being in loud places and being exposed to loud sounds.  Wear hearing protection, such as earplugs, when you are exposed to loud noises.  Do not take stimulants, such as nicotine, alcohol, or caffeine.  Practice techniques for reducing stress, such as meditation, yoga, or deep breathing.  Use a white noise machine, a humidifier, or other devices to mask the sound of tinnitus.  Sleep with your head slightly raised. This may reduce the impact of tinnitus.  Try to get plenty of rest each night. Contact a health care provider if:  You have tinnitus in just one ear.  Your tinnitus continues for 3 weeks or longer without stopping.  Home care measures are not helping.  You have tinnitus after a head injury.  You have tinnitus along with any of the following:  Dizziness.  Loss of balance.  Nausea and vomiting. This information is not intended to replace advice given to you by your health care provider. Make sure you discuss any questions you have with your health care provider. Document Released: 04/20/2005 Document Revised: 12/22/2015 Document Reviewed: 09/20/2013 Elsevier Interactive Patient Education  2017 [employer].    IF you received an x-ray today, you will receive an invoice from [facility]. Please contact [facility] at [phone] with questions or concerns regarding your invoice.   IF you received labwork today, you will receive an invoice from [facility]. Please contact [facility] at [phone] with questions or concerns regarding your invoice.   Our billing staff will not be able to assist you with questions regarding bills from these companies.  You will be contacted with the lab results as soon as they are available. The fastest way to get your results is to activate your My Chart account. Instructions are located on the last page of this paperwork. If you have not heard from [place] regarding the results in 2 weeks, please contact this office.
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
## 2016-08-14 LAB — COMPREHENSIVE METABOLIC PANEL
ALT: 27 IU/L (ref 0–44)
AST: 39 IU/L (ref 0–40)
Albumin/Globulin Ratio: 1.9 (ref 1.2–2.2)
Albumin: 4.5 g/dL (ref 3.6–4.8)
Alkaline Phosphatase: 73 IU/L (ref 39–117)
BUN/Creatinine Ratio: 15 (ref 10–24)
BUN: 14 mg/dL (ref 8–27)
Bilirubin Total: 0.5 mg/dL (ref 0.0–1.2)
CO2: 24 mmol/L (ref 18–29)
Calcium: 9.6 mg/dL (ref 8.6–10.2)
Chloride: 101 mmol/L (ref 96–106)
Creatinine, Ser: 0.93 mg/dL (ref 0.76–1.27)
GFR calc Af Amer: 98 mL/min/{1.73_m2} (ref >59)
GFR calc non Af Amer: 85 mL/min/{1.73_m2} (ref >59)
Globulin, Total: 2.4 g/dL (ref 1.5–4.5)
Glucose: 89 mg/dL (ref 65–99)
Potassium: 5.2 mmol/L (ref 3.5–5.2)
Sodium: 141 mmol/L (ref 134–144)
Total Protein: 6.9 g/dL (ref 6.0–8.5)

## 2016-08-14 LAB — HEPATITIS C ANTIBODY: Hep C Virus Ab: <0.1 s/co ratio (ref 0.0–0.9)

## 2016-08-14 LAB — PSA: Prostate Specific Ag, Serum: 4.2 ng/mL — ABNORMAL HIGH (ref 0.0–4.0)

## 2016-08-14 LAB — LIPID PANEL
Chol/HDL Ratio: 3.5 ratio (ref 0.0–5.0)
Cholesterol, Total: 219 mg/dL — ABNORMAL HIGH (ref 100–199)
HDL: 62 mg/dL (ref >39)
LDL Calculated: 134 mg/dL — ABNORMAL HIGH (ref 0–99)
Triglycerides: 113 mg/dL (ref 0–149)
VLDL Cholesterol Cal: 23 mg/dL (ref 5–40)

## 2016-08-25 ENCOUNTER — Telehealth: Payer: Self-pay | Admitting: Family Medicine

## 2016-08-25 NOTE — Telephone Encounter (Signed)
DR Carlota Raspberry PT WOULD LIKE FOR YOU TO REFER HIM TO [facility] TO SEE DR [facility]

## 2016-08-26 NOTE — Telephone Encounter (Signed)
Referral is in.

## 2016-09-01 ENCOUNTER — Other Ambulatory Visit: Payer: Self-pay | Admitting: Family Medicine

## 2016-09-01 ENCOUNTER — Encounter: Payer: Self-pay | Admitting: Family Medicine

## 2016-09-01 ENCOUNTER — Ambulatory Visit (INDEPENDENT_AMBULATORY_CARE_PROVIDER_SITE_OTHER): Payer: Self-pay | Admitting: Family Medicine

## 2016-09-01 VITALS — BP 138/86 | HR 69 | Temp 98.0°F | Resp 17 | Ht 67.5 in | Wt 175.0 lb

## 2016-09-01 DIAGNOSIS — Z024 Encounter for examination for driving license: Secondary | ICD-10-CM

## 2016-09-01 DIAGNOSIS — R972 Elevated prostate specific antigen [PSA]: Secondary | ICD-10-CM

## 2016-09-01 NOTE — Patient Instructions (Addendum)
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    IF you received an x-ray today, you will receive an invoice from [facility]. Please contact [facility] at [phone] with questions or concerns regarding your invoice.   IF you received labwork today, you will receive an invoice from [place]. Please contact [facility] at [phone] with questions or concerns regarding your invoice.   Our billing staff will not be able to assist you with questions regarding bills from these companies.  You will be contacted with the lab results as soon as they are available. The fastest way to get your results is to activate your My Chart account. Instructions are located on the last page of this paperwork. If you have not heard from [place] regarding the results in 2 weeks, please contact this office.
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
## 2016-12-02 DIAGNOSIS — R972 Elevated prostate specific antigen [PSA]: Secondary | ICD-10-CM | POA: Diagnosis not present
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
## 2017-01-21 ENCOUNTER — Ambulatory Visit (INDEPENDENT_AMBULATORY_CARE_PROVIDER_SITE_OTHER): Payer: Medicare HMO | Admitting: Physician Assistant

## 2017-01-21 ENCOUNTER — Encounter: Payer: Self-pay | Admitting: Physician Assistant

## 2017-01-21 VITALS — BP 142/83 | HR 74 | Temp 97.0°F | Resp 16 | Ht 67.5 in | Wt 181.2 lb

## 2017-01-21 DIAGNOSIS — H6981 Other specified disorders of Eustachian tube, right ear: Secondary | ICD-10-CM | POA: Diagnosis not present

## 2017-01-21 MED ORDER — AMOXICILLIN 875 MG PO TABS: 875.0000 mg | ORAL_TABLET | Freq: Two times a day (BID) | ORAL | 0 refills | Status: AC

## 2017-01-21 NOTE — Patient Instructions (Addendum)
Okay to fill ABX if your hearing suddenly gets worse along with worsening pain in your right ear.  Otherwise I would try some pseudoephedrine along with afrin before flying or diving to treat for a eustachian tube dysfunction.    IF you received an x-ray today, you will receive an invoice from [facility]. Please contact [facility] at [phone] with questions or concerns regarding your invoice.   IF you received labwork today, you will receive an invoice from [facility]. Please contact [facility] at [phone] with questions or concerns regarding your invoice.   Our billing staff will not be able to assist you with questions regarding bills from these companies.  You will be contacted with the lab results as soon as they are available. The fastest way to get your results is to activate your My Chart account. Instructions are located on the last page of this paperwork. If you have not heard from [place] regarding the results in 2 weeks, please contact this office.

## 2017-01-21 NOTE — Progress Notes (Signed)
    01/21/2017 3:04 PM   DOB: 05/31/1950 / MRN: [id number]  SUBJECTIVE:  Alan Ball is a 65 y.o. male presenting for right sided ear pain that started a few days ago.  Tells me that there is some slight drainage and he "hears water."  No fever or pain today. History of allergy and asthma.    He has No Known Allergies.   He  has a past medical history of Allergy and Asthma.    He  reports that he has never smoked. He has never used smokeless tobacco. He reports that he does not drink alcohol or use drugs. He  reports that he does not engage in sexual activity. The patient  has a past surgical history that includes Hernia repair.  His family history includes Cancer in his father; Diabetes in his father.  Review of Systems  Constitutional: Negative for chills, diaphoresis and fever.  HENT: Positive for ear discharge and ear pain. Negative for hearing loss, nosebleeds, sinus pain and tinnitus.   Gastrointestinal: Negative for nausea.  Skin: Negative for rash.  Neurological: Negative for dizziness.    The problem list and medications were reviewed and updated by myself where necessary and exist elsewhere in the encounter.   OBJECTIVE:  BP (!) 142/83 (BP Location: Right Arm, Patient Position: Sitting, Cuff Size: Large)   Pulse 74   Temp (!) 97 F (36.1 C) (Oral)   Resp 16   Ht 5' 7.5" (1.715 m)   Wt 181 lb 3.2 oz (82.2 kg)   SpO2 95%   BMI 27.96 kg/m   Physical Exam  Constitutional: He appears well-developed. He is active and cooperative.  Non-toxic appearance.  HENT:  Right Ear: Hearing, tympanic membrane, external ear and ear canal normal.  Left Ear: Hearing, tympanic membrane, external ear and ear canal normal.  Nose: Nose normal. Right sinus exhibits no maxillary sinus tenderness and no frontal sinus tenderness. Left sinus exhibits no maxillary sinus tenderness and no frontal sinus tenderness.  Mouth/Throat: Uvula is midline, oropharynx is clear and moist and mucous  membranes are normal. No oropharyngeal exudate, posterior oropharyngeal edema or tonsillar abscesses.  Eyes: Pupils are equal, round, and reactive to light. Conjunctivae are normal.  Cardiovascular: Normal rate.   Pulmonary/Chest: Effort normal. No tachypnea.  Lymphadenopathy:       Head (right side): No submandibular and no tonsillar adenopathy present.       Head (left side): No submandibular and no tonsillar adenopathy present.    He has no cervical adenopathy.  Neurological: He is alert.  Skin: Skin is warm and dry. He is not diaphoretic. No pallor.  Vitals reviewed.   No results found for this or any previous visit (from the past 72 hour(s)).  No results found.  ASSESSMENT AND PLAN:  Alan Ball was seen today for ear pain.  Diagnoses and all orders for this visit:  Dysfunction of right eustachian tube: See AVS for reqs. ABX to fill if any sudden pain or changes.   Other orders -     amoxicillin (AMOXIL) 875 MG tablet; Take 1 tablet (875 mg total) by mouth 2 (two) times daily.    The patient is advised to call or return to clinic if he does not see an improvement in symptoms, or to seek the care of the closest emergency department if he worsens with the above plan.   Philis Fendt, MHS, PA-C [facility] 01/21/2017 3:04 PM

## 2017-04-19 ENCOUNTER — Ambulatory Visit (INDEPENDENT_AMBULATORY_CARE_PROVIDER_SITE_OTHER): Payer: Medicare HMO | Admitting: Family Medicine

## 2017-04-19 DIAGNOSIS — Z23 Encounter for immunization: Secondary | ICD-10-CM

## 2017-06-03 DIAGNOSIS — Z7982 Long term (current) use of aspirin: Secondary | ICD-10-CM | POA: Diagnosis not present

## 2017-06-03 DIAGNOSIS — Z833 Family history of diabetes mellitus: Secondary | ICD-10-CM | POA: Diagnosis not present

## 2017-06-03 DIAGNOSIS — J45909 Unspecified asthma, uncomplicated: Secondary | ICD-10-CM | POA: Diagnosis not present

## 2017-07-08 DIAGNOSIS — R69 Illness, unspecified: Secondary | ICD-10-CM | POA: Diagnosis not present

## 2017-07-12 DIAGNOSIS — R69 Illness, unspecified: Secondary | ICD-10-CM | POA: Diagnosis not present

## 2017-11-30 ENCOUNTER — Ambulatory Visit (INDEPENDENT_AMBULATORY_CARE_PROVIDER_SITE_OTHER): Payer: Medicare HMO | Admitting: Family Medicine

## 2017-11-30 ENCOUNTER — Encounter: Payer: Self-pay | Admitting: Family Medicine

## 2017-11-30 VITALS — BP 132/80 | HR 78 | Temp 98.4°F | Resp 17 | Ht 67.5 in | Wt 183.0 lb

## 2017-11-30 DIAGNOSIS — R609 Edema, unspecified: Secondary | ICD-10-CM | POA: Diagnosis not present

## 2017-11-30 DIAGNOSIS — J9801 Acute bronchospasm: Secondary | ICD-10-CM

## 2017-11-30 DIAGNOSIS — R208 Other disturbances of skin sensation: Secondary | ICD-10-CM | POA: Diagnosis not present

## 2017-11-30 MED ORDER — ALBUTEROL SULFATE HFA 108 (90 BASE) MCG/ACT IN AERS: 1.0000 | INHALATION_SPRAY | RESPIRATORY_TRACT | 0 refills | Status: DC | PRN

## 2017-11-30 NOTE — Patient Instructions (Addendum)
See information below on peripheral edema/pedal edema.  I will check blood counts, electrolytes, thyroid test, and a B12 level because of some of the numbness and tingling you have had in your hands and feet.  However follow-up with Dr. Alfonso Ramus as planned to discuss those symptoms further.  Monitor your blood pressures and keep a record for next visit.  If those are persistently staying elevated, may consider a low-dose medication.  Please follow-up with me in 2 weeks to review lab results from today as well as recheck swelling in legs.  I did refill the inhaler, but can discuss that med as well next visit. Thank you for coming in today.    Peripheral Edema Peripheral edema is swelling that is caused by a buildup of fluid. Peripheral edema most often affects the lower legs, ankles, and feet. It can also develop in the arms, hands, and face. The area of the body that has peripheral edema will look swollen. It may also feel heavy or warm. Your clothes may start to feel tight. Pressing on the area may make a temporary dent in your skin. You may not be able to move your arm or leg as much as usual. There are many causes of peripheral edema. It can be a complication of other diseases, such as congestive heart failure, kidney disease, or a problem with your blood circulation. It also can be a side effect of certain medicines. It often happens to women during pregnancy. Sometimes, the cause is not known. Treating the underlying condition is often the only treatment for peripheral edema. Follow these instructions at home: Pay attention to any changes in your symptoms. Take these actions to help with your discomfort:  Raise (elevate) your legs while you are sitting or lying down.  Move around often to prevent stiffness and to lessen swelling. Do not sit or stand for long periods of time.  Wear support stockings as told by your health care provider.  Follow instructions from your health care provider about  limiting salt (sodium) in your diet. Sometimes eating less salt can reduce swelling.  Take over-the-counter and prescription medicines only as told by your health care provider. Your health care provider may prescribe medicine to help your body get rid of excess water (diuretic).  Keep all follow-up visits as told by your health care provider. This is important.  Contact a health care provider if:  You have a fever.  Your edema starts suddenly or is getting worse, especially if you are pregnant or have a medical condition.  You have swelling in only one leg.  You have increased swelling and pain in your legs. Get help right away if:  You develop shortness of breath, especially when you are lying down.  You have pain in your chest or abdomen.  You feel weak.  You faint. This information is not intended to replace advice given to you by your health care provider. Make sure you discuss any questions you have with your health care provider. Document Released: 05/28/2004 Document Revised: 09/23/2015 Document Reviewed: 10/31/2014 Elsevier Interactive Patient Education  2018 [employer].    IF you received an x-ray today, you will receive an invoice from [facility]. Please contact [facility] at [phone] with questions or concerns regarding your invoice.   IF you received labwork today, you will receive an invoice from [facility]. Please contact [facility] at [phone] with questions or concerns regarding your invoice.   Our billing staff will not be able to assist you  with questions regarding bills from these companies.  You will be contacted with the lab results as soon as they are available. The fastest way to get your results is to activate your My Chart account. Instructions are located on the last page of this paperwork. If you have not heard from [place] regarding the results in 2 weeks, please contact this office.

## 2017-11-30 NOTE — Progress Notes (Signed)
Subjective:  By signing my name below, I, Alan Ball, attest that this documentation has been prepared under the direction and in the presence of Alan Agreste, MD Electronically Signed: Delton Ball Medical Scribe 11/30/2017 at 12:22 PM   Patient ID: Alan Ball, male    DOB: Mar 15, 1951, 67 y.o.   MRN: [id number] Chief Complaint  Patient presents with  . leg and foot swelling    HPI Alan Ball is a 67 y.o. male who presents to Primary Care at [facility] complaining of leg and foot swelling. Patient report left leg swelling starting 3 days ago. Patient burned his left upper calf on the lawn mower about two week ago, he denies exudate and reports improvement. He denies consuming a lot of salt. He reports that right leg swelling started 4 days ago. He denies any changes in medicine, or supplements. He has not had any trips where there was a long car ride. Denies fevers, chest pain or having a history of blood clots. He noticed some possible left knee swelling. Patient reports that he has upcoming appt with ortho for numbness in hands and finger, as well as numbness on toes. Denies swelling or numbness on tongue. Paitent reports golfing. He reports that he took tumeric supplement, and over the counter aloe.   Patient Active Problem List   Diagnosis Date Noted  . 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 Allergy   . Asthma    Past Surgical History:  Procedure Laterality Date  . HERNIA REPAIR     No Known Allergies Prior to Admission medications   Medication Sig Start Date End Date Taking? Authorizing Provider  albuterol (PROVENTIL HFA;VENTOLIN HFA) 108 (90 Base) MCG/ACT inhaler Inhale 1-2 puffs into the lungs every 4 (four) hours as needed for wheezing or shortness of breath. 08/13/16   Alan Agreste, MD  aspirin EC 81 MG tablet Take 81 mg by mouth daily.    [provider]  ibuprofen (ADVIL,MOTRIN) 200 MG tablet Take 200 mg by mouth  every 6 (six) hours as needed for pain.    [provider]  meloxicam (MOBIC) 15 MG tablet Take 15 mg by mouth daily.    [provider]  Multiple Vitamin (MULTIVITAMIN) tablet Take 1 tablet by mouth.    [provider]  vitamin C (ASCORBIC ACID) 500 MG tablet Take 500 mg by mouth daily.    [provider]   Social History   Socioeconomic History  . Marital status: Married    Spouse name: Not on file  . Number of children: Not on file  . Years of education: Not on file  . Highest education level: Not on file  Occupational History  . Not on file  Social Needs  . Financial resource strain: Not on file  . Food insecurity:    Worry: Not on file    Inability: Not on file  . Transportation needs:    Medical: Not on file    Non-medical: Not on file  Tobacco Use  . Smoking status: Never Smoker  . Smokeless tobacco: Never Used  Substance and Sexual Activity  . Alcohol use: No  . Drug use: No  . Sexual activity: Never  Lifestyle  . Physical activity:    Days per week: Not on file    Minutes per session: Not on file  . Stress: Not on file  Relationships  . Social connections:    Talks on  phone: Not on file    Gets together: Not on file    Attends religious service: Not on file    Active member of club or organization: Not on file    Attends meetings of clubs or organizations: Not on file    Relationship status: Not on file  . Intimate partner violence:    Fear of current or ex partner: Not on file    Emotionally abused: Not on file    Physically abused: Not on file    Forced sexual activity: Not on file  Other Topics Concern  . Not on file  Social History Narrative  . Not on file     Review of Systems     Objective:   Physical Exam  Constitutional: He is oriented to person, place, and time. He appears well-developed and well-nourished.  HENT:  Head: Normocephalic and atraumatic.  Eyes: Pupils are equal, round, and reactive to  light. EOM are normal.  Neck: No JVD present. Carotid bruit is not present.  Cardiovascular: Normal rate, regular rhythm and normal heart sounds.  No murmur heard. Pulmonary/Chest: Effort normal and breath sounds normal. He has no rales.  Musculoskeletal: He exhibits no edema.       Left ankle: He exhibits swelling.       Left lower leg: He exhibits no tenderness.       Legs:      Feet:  Left Upper calf approximately 3 cm across, no surrounding erythema or exudate. Calves are non tender, radial pulse is intact and DP intact.  1+ in lower third of his tibia, bilaterally.  Feet:  Right Foot:  Skin Integrity: Negative for erythema.  Left Foot:  Skin Integrity: Negative for erythema.  Neurological: He is alert and oriented to person, place, and time.  Skin: Skin is warm and dry.  Psychiatric: He has a normal mood and affect.  Vitals reviewed.    Vitals:   11/30/17 1147  BP: (!) 147/86  Pulse: 78  Resp: 17  Temp: 98.4 F (36.9 C)  TempSrc: Oral  SpO2: 98%  Weight: 183 lb (83 kg)  Height: 5' 7.5" (1.715 m)      Assessment & Plan:    Alan Ball is a 67 y.o. male Peripheral edema - Plan: Basic metabolic panel, CBC, TSH  - minimal on exam. Wound on upper leg healing, unlikely significant contributor.  Will check CBC, TSH BMP, and B12.  Handout given.  Monitor sodium in diet and RTC precautions if persistent or worsening.  -Borderline hypertension.  Consider low-dose HCTZ which would also potentially help edema if persistent elevated readings. Recheck in 2 weeks.   Dysesthesia - Plan: Vitamin B12  -Upcoming appointment with Ortho, but will check TSH, B12.  Stable, rare use of albuterol, refilled.  Bronchospasm - Plan: albuterol (PROVENTIL HFA;VENTOLIN HFA) 108 (90 Base) MCG/ACT inhaler  -  Meds ordered this encounter  Medications  . albuterol (PROVENTIL HFA;VENTOLIN HFA) 108 (90 Base) MCG/ACT inhaler    Sig: Inhale 1-2 puffs into the lungs every 4 (four) hours as  needed for wheezing or shortness of breath.    Dispense:  1 Inhaler    Refill:  0   Patient Instructions    See information below on peripheral edema/pedal edema.  I will check blood counts, electrolytes, thyroid test, and a B12 level because of some of the numbness and tingling you have had in your hands and feet.  However follow-up with Dr. Alfonso Ramus as planned to discuss  those symptoms further.  Monitor your blood pressures and keep a record for next visit.  If those are persistently staying elevated, may consider a low-dose medication.  Please follow-up with me in 2 weeks to review lab results from today as well as recheck swelling in legs.  I did refill the inhaler, but can discuss that med as well next visit. Thank you for coming in today.    Peripheral Edema Peripheral edema is swelling that is caused by a buildup of fluid. Peripheral edema most often affects the lower legs, ankles, and feet. It can also develop in the arms, hands, and face. The area of the body that has peripheral edema will look swollen. It may also feel heavy or warm. Your clothes may start to feel tight. Pressing on the area may make a temporary dent in your skin. You may not be able to move your arm or leg as much as usual. There are many causes of peripheral edema. It can be a complication of other diseases, such as congestive heart failure, kidney disease, or a problem with your blood circulation. It also can be a side effect of certain medicines. It often happens to women during pregnancy. Sometimes, the cause is not known. Treating the underlying condition is often the only treatment for peripheral edema. Follow these instructions at home: Pay attention to any changes in your symptoms. Take these actions to help with your discomfort:  Raise (elevate) your legs while you are sitting or lying down.  Move around often to prevent stiffness and to lessen swelling. Do not sit or stand for long periods of time.  Wear  support stockings as told by your health care provider.  Follow instructions from your health care provider about limiting salt (sodium) in your diet. Sometimes eating less salt can reduce swelling.  Take over-the-counter and prescription medicines only as told by your health care provider. Your health care provider may prescribe medicine to help your body get rid of excess water (diuretic).  Keep all follow-up visits as told by your health care provider. This is important.  Contact a health care provider if:  You have a fever.  Your edema starts suddenly or is getting worse, especially if you are pregnant or have a medical condition.  You have swelling in only one leg.  You have increased swelling and pain in your legs. Get help right away if:  You develop shortness of breath, especially when you are lying down.  You have pain in your chest or abdomen.  You feel weak.  You faint. This information is not intended to replace advice given to you by your health care provider. Make sure you discuss any questions you have with your health care provider. Document Released: 05/28/2004 Document Revised: 09/23/2015 Document Reviewed: 10/31/2014 Elsevier Interactive Patient Education  2018 [employer].    IF you received an x-ray today, you will receive an invoice from [facility]. Please contact [facility] at [phone] with questions or concerns regarding your invoice.   IF you received labwork today, you will receive an invoice from [place]. Please contact [facility] at [phone] with questions or concerns regarding your invoice.   Our billing staff will not be able to assist you with questions regarding bills from these companies.  You will be contacted with the lab results as soon as they are available. The fastest way to get your results is to activate your My Chart account. Instructions are located on the last page of this paperwork.  If you have not heard  from [place] regarding the results in 2 weeks, please contact this office.       I personally performed the services described in this documentation, which was scribed in my presence. The recorded information has been reviewed and considered for accuracy and completeness, addended by me as needed, and agree with information above.  Signed,   [name], MD [facility] [place] [facility].  12/03/17 12:53 PM

## 2017-12-01 LAB — CBC
Hematocrit: 44.3 % (ref 37.5–51.0)
Hemoglobin: 14.3 g/dL (ref 13.0–17.7)
MCH: 28.8 pg (ref 26.6–33.0)
MCHC: 32.3 g/dL (ref 31.5–35.7)
MCV: 89 fL (ref 79–97)
Platelets: 285 10*3/uL (ref 150–450)
RBC: 4.97 x10E6/uL (ref 4.14–5.80)
RDW: 12.9 % (ref 12.3–15.4)
WBC: 6 10*3/uL (ref 3.4–10.8)

## 2017-12-01 LAB — BASIC METABOLIC PANEL
BUN/Creatinine Ratio: 15 (ref 10–24)
BUN: 14 mg/dL (ref 8–27)
CO2: 23 mmol/L (ref 20–29)
Calcium: 9.5 mg/dL (ref 8.6–10.2)
Chloride: 105 mmol/L (ref 96–106)
Creatinine, Ser: 0.96 mg/dL (ref 0.76–1.27)
GFR calc Af Amer: 93 mL/min/{1.73_m2} (ref >59)
GFR calc non Af Amer: 80 mL/min/{1.73_m2} (ref >59)
Glucose: 95 mg/dL (ref 65–99)
Potassium: 4.3 mmol/L (ref 3.5–5.2)
Sodium: 141 mmol/L (ref 134–144)

## 2017-12-01 LAB — VITAMIN B12: Vitamin B-12: 547 pg/mL (ref 232–1245)

## 2017-12-01 LAB — TSH: TSH: 2.95 u[IU]/mL (ref 0.450–4.500)

## 2017-12-02 DIAGNOSIS — G5603 Carpal tunnel syndrome, bilateral upper limbs: Secondary | ICD-10-CM | POA: Diagnosis not present

## 2017-12-02 DIAGNOSIS — M542 Cervicalgia: Secondary | ICD-10-CM | POA: Diagnosis not present

## 2017-12-02 DIAGNOSIS — M65311 Trigger thumb, right thumb: Secondary | ICD-10-CM | POA: Diagnosis not present

## 2017-12-20 DIAGNOSIS — M654 Radial styloid tenosynovitis [de Quervain]: Secondary | ICD-10-CM | POA: Insufficient documentation

## 2017-12-20 DIAGNOSIS — M65331 Trigger finger, right middle finger: Secondary | ICD-10-CM | POA: Diagnosis not present

## 2017-12-20 DIAGNOSIS — G5601 Carpal tunnel syndrome, right upper limb: Secondary | ICD-10-CM | POA: Diagnosis not present

## 2017-12-21 ENCOUNTER — Other Ambulatory Visit: Payer: Self-pay

## 2017-12-21 ENCOUNTER — Ambulatory Visit (INDEPENDENT_AMBULATORY_CARE_PROVIDER_SITE_OTHER): Payer: Medicare HMO | Admitting: Family Medicine

## 2017-12-21 ENCOUNTER — Encounter: Payer: Self-pay | Admitting: Family Medicine

## 2017-12-21 VITALS — BP 148/76 | HR 67 | Temp 97.7°F | Ht 68.0 in | Wt 184.0 lb

## 2017-12-21 DIAGNOSIS — I10 Essential (primary) hypertension: Secondary | ICD-10-CM

## 2017-12-21 DIAGNOSIS — R600 Localized edema: Secondary | ICD-10-CM

## 2017-12-21 DIAGNOSIS — Z0100 Encounter for examination of eyes and vision without abnormal findings: Secondary | ICD-10-CM | POA: Diagnosis not present

## 2017-12-21 MED ORDER — HYDROCHLOROTHIAZIDE 12.5 MG PO CAPS: 12.5000 mg | ORAL_CAPSULE | Freq: Every day | ORAL | 1 refills | Status: DC

## 2017-12-21 NOTE — Patient Instructions (Addendum)
See information below on high blood pressure as well as information on how to check your blood pressure correctly.  Based on readings today I would recommend starting hydrochlorothiazide once per day, especially if home readings are over 140/90.  Recheck in 4 to 6 weeks.  Watch for lightheadedness or dizziness with starting blood pressure medicine.  If that occurs, return to discuss the symptoms further.  Swelling appears to be improved.  Try to avoid excessive salt in the diet including sodium with beer as that can also increase blood pressure.   Return to the clinic or go to the nearest emergency room if any of your symptoms worsen or new symptoms occur.   How to Take Your Blood Pressure You can take your blood pressure at home with a machine. You may need to check your blood pressure at home:  To check if you have high blood pressure (hypertension).  To check your blood pressure over time.  To make sure your blood pressure medicine is working.  Supplies needed: You will need a blood pressure machine, or monitor. You can buy one at a drugstore or online. When choosing one:  Choose one with an arm cuff.  Choose one that wraps around your upper arm. Only one finger should fit between your arm and the cuff.  Do not choose one that measures your blood pressure from your wrist or finger.  Your doctor can suggest a monitor. How to prepare Avoid these things for 30 minutes before checking your blood pressure:  Drinking caffeine.  Drinking alcohol.  Eating.  Smoking.  Exercising.  Five minutes before checking your blood pressure:  Pee.  Sit in a dining chair. Avoid sitting in a soft couch or armchair.  Be quiet. Do not talk.  How to take your blood pressure Follow the instructions that came with your machine. If you have a digital blood pressure monitor, these may be the instructions: 1. Sit up straight. 2. Place your feet on the floor. Do not cross your ankles or  legs. 3. Rest your left arm at the level of your heart. You may rest it on a table, desk, or chair. 4. Pull up your shirt sleeve. 5. Wrap the blood pressure cuff around the upper part of your left arm. The cuff should be 1 inch (2.5 cm) above your elbow. It is best to wrap the cuff around bare skin. 6. Fit the cuff snugly around your arm. You should be able to place only one finger between the cuff and your arm. 7. Put the cord inside the groove of your elbow. 8. Press the power button. 9. Sit quietly while the cuff fills with air and loses air. 10. Write down the numbers on the screen. 11. Wait 2-3 minutes and then repeat steps 1-10.  What do the numbers mean? Two numbers make up your blood pressure. The first number is called systolic pressure. The second is called diastolic pressure. An example of a blood pressure reading is "120 over 80" (or 120/80). If you are an adult and do not have a medical condition, use this guide to find out if your blood pressure is normal: Normal  First number: below 120.  Second number: below 80. Elevated  First number: 120-129.  Second number: below 80. Hypertension stage 1  First number: 130-139.  Second number: 80-89. Hypertension stage 2  First number: 140 or above.  Second number: 35 or above. Your blood pressure is above normal even if only the top  or bottom number is above normal. Follow these instructions at home:  Check your blood pressure as often as your doctor tells you to.  Take your monitor to your next doctor's appointment. Your doctor will: ? Make sure you are using it correctly. ? Make sure it is working right.  Make sure you understand what your blood pressure numbers should be.  Tell your doctor if your medicines are causing side effects. Contact a doctor if:  Your blood pressure keeps being high. Get help right away if:  Your first blood pressure number is higher than 180.  Your second blood pressure number is  higher than 120. This information is not intended to replace advice given to you by your health care provider. Make sure you discuss any questions you have with your health care provider. Document Released: 04/02/2008 Document Revised: 03/18/2016 Document Reviewed: 09/27/2015 Elsevier Interactive Patient Education  2018 [employer].  Hypertension Hypertension, commonly called high blood pressure, is when the force of blood pumping through the arteries is too strong. The arteries are the blood vessels that carry blood from the heart throughout the body. Hypertension forces the heart to work harder to pump blood and may cause arteries to become narrow or stiff. Having untreated or uncontrolled hypertension can cause heart attacks, strokes, kidney disease, and other problems. A blood pressure reading consists of a higher number over a lower number. Ideally, your blood pressure should be below 120/80. The first ("top") number is called the systolic pressure. It is a measure of the pressure in your arteries as your heart beats. The second ("bottom") number is called the diastolic pressure. It is a measure of the pressure in your arteries as the heart relaxes. What are the causes? The cause of this condition is not known. What increases the risk? Some risk factors for high blood pressure are under your control. Others are not. Factors you can change  Smoking.  Having type 2 diabetes mellitus, high cholesterol, or both.  Not getting enough exercise or physical activity.  Being overweight.  Having too much fat, sugar, calories, or salt (sodium) in your diet.  Drinking too much alcohol. Factors that are difficult or impossible to change  Having chronic kidney disease.  Having a family history of high blood pressure.  Age. Risk increases with age.  Race. You may be at higher risk if you are African-American.  Gender. Men are at higher risk than women before age 2. After age 53, women are  at higher risk than men.  Having obstructive sleep apnea.  Stress. What are the signs or symptoms? Extremely high blood pressure (hypertensive crisis) may cause:  Headache.  Anxiety.  Shortness of breath.  Nosebleed.  Nausea and vomiting.  Severe chest pain.  Jerky movements you cannot control (seizures).  How is this diagnosed? This condition is diagnosed by measuring your blood pressure while you are seated, with your arm resting on a surface. The cuff of the blood pressure monitor will be placed directly against the skin of your upper arm at the level of your heart. It should be measured at least twice using the same arm. Certain conditions can cause a difference in blood pressure between your right and left arms. Certain factors can cause blood pressure readings to be lower or higher than normal (elevated) for a short period of time:  When your blood pressure is higher when you are in a health care provider's office than when you are at home, this is called white  coat hypertension. Most people with this condition do not need medicines.  When your blood pressure is higher at home than when you are in a health care provider's office, this is called masked hypertension. Most people with this condition may need medicines to control blood pressure.  If you have a high blood pressure reading during one visit or you have normal blood pressure with other risk factors:  You may be asked to return on a different day to have your blood pressure checked again.  You may be asked to monitor your blood pressure at home for 1 week or longer.  If you are diagnosed with hypertension, you may have other blood or imaging tests to help your health care provider understand your overall risk for other conditions. How is this treated? This condition is treated by making healthy lifestyle changes, such as eating healthy foods, exercising more, and reducing your alcohol intake. Your health care  provider may prescribe medicine if lifestyle changes are not enough to get your blood pressure under control, and if:  Your systolic blood pressure is above 130.  Your diastolic blood pressure is above 80.  Your personal target blood pressure may vary depending on your medical conditions, your age, and other factors. Follow these instructions at home: Eating and drinking  Eat a diet that is high in fiber and potassium, and low in sodium, added sugar, and fat. An example eating plan is called the DASH (Dietary Approaches to Stop Hypertension) diet. To eat this way: ? Eat plenty of fresh fruits and vegetables. Try to fill half of your plate at each meal with fruits and vegetables. ? Eat whole grains, such as whole wheat pasta, brown rice, or whole grain bread. Fill about one quarter of your plate with whole grains. ? Eat or drink low-fat dairy products, such as skim milk or low-fat yogurt. ? Avoid fatty cuts of meat, processed or cured meats, and poultry with skin. Fill about one quarter of your plate with lean proteins, such as fish, chicken without skin, beans, eggs, and tofu. ? Avoid premade and processed foods. These tend to be higher in sodium, added sugar, and fat.  Reduce your daily sodium intake. Most people with hypertension should eat less than 1,500 mg of sodium a day.  Limit alcohol intake to no more than 1 drink a day for nonpregnant women and 2 drinks a day for men. One drink equals 12 oz of beer, 5 oz of wine, or 1 oz of hard liquor. Lifestyle  Work with your health care provider to maintain a healthy body weight or to lose weight. Ask what an ideal weight is for you.  Get at least 30 minutes of exercise that causes your heart to beat faster (aerobic exercise) most days of the week. Activities may include walking, swimming, or biking.  Include exercise to strengthen your muscles (resistance exercise), such as pilates or lifting weights, as part of your weekly exercise routine.  Try to do these types of exercises for 30 minutes at least 3 days a week.  Do not use any products that contain nicotine or tobacco, such as cigarettes and e-cigarettes. If you need help quitting, ask your health care provider.  Monitor your blood pressure at home as told by your health care provider.  Keep all follow-up visits as told by your health care provider. This is important. Medicines  Take over-the-counter and prescription medicines only as told by your health care provider. Follow directions carefully. Blood pressure medicines  must be taken as prescribed.  Do not skip doses of blood pressure medicine. Doing this puts you at risk for problems and can make the medicine less effective.  Ask your health care provider about side effects or reactions to medicines that you should watch for. Contact a health care provider if:  You think you are having a reaction to a medicine you are taking.  You have headaches that keep coming back (recurring).  You feel dizzy.  You have swelling in your ankles.  You have trouble with your vision. Get help right away if:  You develop a severe headache or confusion.  You have unusual weakness or numbness.  You feel faint.  You have severe pain in your chest or abdomen.  You vomit repeatedly.  You have trouble breathing. Summary  Hypertension is when the force of blood pumping through your arteries is too strong. If this condition is not controlled, it may put you at risk for serious complications.  Your personal target blood pressure may vary depending on your medical conditions, your age, and other factors. For most people, a normal blood pressure is less than 120/80.  Hypertension is treated with lifestyle changes, medicines, or a combination of both. Lifestyle changes include weight loss, eating a healthy, low-sodium diet, exercising more, and limiting alcohol. This information is not intended to replace advice given to you by your  health care provider. Make sure you discuss any questions you have with your health care provider. Document Released: 04/20/2005 Document Revised: 03/18/2016 Document Reviewed: 03/18/2016 Elsevier Interactive Patient Education  [employer].   If you have lab work done today you will be contacted with your lab results within the next 2 weeks.  If you have not heard from [place] then please contact us. The fastest way to get your results is to register for My Chart.   IF you received an x-ray today, you will receive an invoice from [facility]. Please contact [facility] at [phone] with questions or concerns regarding your invoice.   IF you received labwork today, you will receive an invoice from [place]. Please contact [facility] at [phone] with questions or concerns regarding your invoice.   Our billing staff will not be able to assist you with questions regarding bills from these companies.  You will be contacted with the lab results as soon as they are available. The fastest way to get your results is to activate your My Chart account. Instructions are located on the last page of this paperwork. If you have not heard from [place] regarding the results in 2 weeks, please contact this office.
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
## 2017-12-23 DIAGNOSIS — M65331 Trigger finger, right middle finger: Secondary | ICD-10-CM | POA: Diagnosis not present

## 2017-12-23 DIAGNOSIS — G5601 Carpal tunnel syndrome, right upper limb: Secondary | ICD-10-CM | POA: Diagnosis not present

## 2017-12-24 DIAGNOSIS — M65341 Trigger finger, right ring finger: Secondary | ICD-10-CM | POA: Diagnosis not present

## 2017-12-28 DIAGNOSIS — G5601 Carpal tunnel syndrome, right upper limb: Secondary | ICD-10-CM | POA: Diagnosis not present

## 2017-12-28 DIAGNOSIS — M65331 Trigger finger, right middle finger: Secondary | ICD-10-CM | POA: Diagnosis not present

## 2017-12-28 DIAGNOSIS — M654 Radial styloid tenosynovitis [de Quervain]: Secondary | ICD-10-CM | POA: Diagnosis not present

## 2018-01-12 DIAGNOSIS — M25641 Stiffness of right hand, not elsewhere classified: Secondary | ICD-10-CM | POA: Diagnosis not present

## 2018-01-12 DIAGNOSIS — M654 Radial styloid tenosynovitis [de Quervain]: Secondary | ICD-10-CM | POA: Diagnosis not present

## 2018-01-12 DIAGNOSIS — G5601 Carpal tunnel syndrome, right upper limb: Secondary | ICD-10-CM | POA: Diagnosis not present

## 2018-01-12 DIAGNOSIS — M65331 Trigger finger, right middle finger: Secondary | ICD-10-CM | POA: Diagnosis not present

## 2018-01-12 DIAGNOSIS — R29898 Other symptoms and signs involving the musculoskeletal system: Secondary | ICD-10-CM | POA: Diagnosis not present

## 2018-01-17 DIAGNOSIS — Z0100 Encounter for examination of eyes and vision without abnormal findings: Secondary | ICD-10-CM | POA: Diagnosis not present

## 2018-01-24 DIAGNOSIS — G5601 Carpal tunnel syndrome, right upper limb: Secondary | ICD-10-CM | POA: Diagnosis not present

## 2018-01-24 DIAGNOSIS — M25641 Stiffness of right hand, not elsewhere classified: Secondary | ICD-10-CM | POA: Diagnosis not present

## 2018-01-24 DIAGNOSIS — M654 Radial styloid tenosynovitis [de Quervain]: Secondary | ICD-10-CM | POA: Diagnosis not present

## 2018-01-24 DIAGNOSIS — R29898 Other symptoms and signs involving the musculoskeletal system: Secondary | ICD-10-CM | POA: Diagnosis not present

## 2018-01-24 DIAGNOSIS — M65331 Trigger finger, right middle finger: Secondary | ICD-10-CM | POA: Diagnosis not present

## 2018-01-27 DIAGNOSIS — R69 Illness, unspecified: Secondary | ICD-10-CM | POA: Diagnosis not present

## 2018-01-31 DIAGNOSIS — Z0100 Encounter for examination of eyes and vision without abnormal findings: Secondary | ICD-10-CM | POA: Diagnosis not present

## 2018-02-01 ENCOUNTER — Ambulatory Visit: Payer: Medicare HMO | Admitting: Family Medicine

## 2018-02-01 DIAGNOSIS — M654 Radial styloid tenosynovitis [de Quervain]: Secondary | ICD-10-CM | POA: Diagnosis not present

## 2018-02-01 DIAGNOSIS — G5601 Carpal tunnel syndrome, right upper limb: Secondary | ICD-10-CM | POA: Diagnosis not present

## 2018-02-01 DIAGNOSIS — M25641 Stiffness of right hand, not elsewhere classified: Secondary | ICD-10-CM | POA: Diagnosis not present

## 2018-02-01 DIAGNOSIS — M65331 Trigger finger, right middle finger: Secondary | ICD-10-CM | POA: Diagnosis not present

## 2018-02-01 DIAGNOSIS — R29898 Other symptoms and signs involving the musculoskeletal system: Secondary | ICD-10-CM | POA: Diagnosis not present

## 2018-02-17 ENCOUNTER — Other Ambulatory Visit: Payer: Self-pay

## 2018-02-17 ENCOUNTER — Ambulatory Visit (INDEPENDENT_AMBULATORY_CARE_PROVIDER_SITE_OTHER): Payer: Medicare HMO | Admitting: Family Medicine

## 2018-02-17 ENCOUNTER — Encounter: Payer: Self-pay | Admitting: Family Medicine

## 2018-02-17 VITALS — BP 134/80 | HR 76 | Temp 97.9°F | Ht 68.5 in | Wt 186.4 lb

## 2018-02-17 DIAGNOSIS — Z23 Encounter for immunization: Secondary | ICD-10-CM

## 2018-02-17 DIAGNOSIS — R030 Elevated blood-pressure reading, without diagnosis of hypertension: Secondary | ICD-10-CM

## 2018-02-17 DIAGNOSIS — R12 Heartburn: Secondary | ICD-10-CM | POA: Diagnosis not present

## 2018-02-17 NOTE — Progress Notes (Signed)
Subjective:  By signing my name below, I, Essence Howell, attest that this documentation has been prepared under the direction and in the presence of [name], MD Electronically Signed: Ladene Artist, ED Scribe 02/17/2018 at 2:33 PM.   Patient ID: Alan Ball, male    DOB: Aug 27, 1950, 67 y.o.   MRN: [id number]  Chief Complaint  Patient presents with  . Hypertension    f/u   HPI Alan Ball is a 67 y.o. male who presents to Primary Care at [facility] for f/u on HTN. Last seen 8/20. Advised to start HCTZ 12.5 mg if persistent elevated readings. Handout given. Prev peripheral edema had improved. Home readings 128-151/73-97. - Pt has not started HCTZ. States his BP occasionally spikes but has been 130s/80s overall. 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 Denies cp, sob, leg swelling, any other symptoms, consuming more beers.  Lab Results  Component Value Date   CREATININE 0.96 11/30/2017   BP Readings from Last 3 Encounters:  02/17/18 134/80  12/21/17 (!) 148/76  11/30/17 132/80   Indigestion Pt reports occasional indigestion in the evenings after eating dinner. He has occasionally noticed symptoms with eating fried foods but not with spicy foods. States symptoms improve with [employer].  Patient Active Problem List   Diagnosis Date Noted  . 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 Allergy   . Asthma    Past Surgical History:  Procedure Laterality Date  . HERNIA REPAIR     No Known Allergies Prior to Admission medications   Medication Sig Start Date End Date Taking? Authorizing Provider  albuterol (PROVENTIL HFA;VENTOLIN HFA) 108 (90 Base) MCG/ACT inhaler Inhale 1-2 puffs into the lungs every 4 (four) hours as needed for wheezing or shortness of breath. 11/30/17   [name], MD  aspirin EC 81 MG tablet Take 81 mg by mouth daily.    [provider]  hydrochlorothiazide (MICROZIDE)  12.5 MG capsule Take 1 capsule (12.5 mg total) by mouth daily. 12/21/17   [name], MD  ibuprofen (ADVIL,MOTRIN) 200 MG tablet Take 200 mg by mouth every 6 (six) hours as needed for pain.    [provider]  Multiple Vitamin (MULTIVITAMIN) tablet Take 1 tablet by mouth.    [provider]  vitamin C (ASCORBIC ACID) 500 MG tablet Take 500 mg by mouth daily.    [provider]   Social History   Socioeconomic History  . Marital status: Married    Spouse name: Not on file  . Number of children: Not on file  . Years of education: Not on file  . Highest education level: Not on file  Occupational History  . Not on file  Social Needs  . Financial resource strain: Not on file  . Food insecurity:    Worry: Not on file    Inability: Not on file  . Transportation needs:    Medical: Not on file    Non-medical: Not on file  Tobacco Use  . Smoking status: Never Smoker  . Smokeless tobacco: Never Used  Substance and Sexual Activity  . Alcohol use: No  . Drug use: No  . Sexual activity: Never  Lifestyle  . Physical activity:    Days per week: Not on file    Minutes per session: Not on file  . Stress: Not on file  Relationships  . Social connections:  Talks on phone: Not on file    Gets together: Not on file    Attends religious service: Not on file    Active member of club or organization: Not on file    Attends meetings of clubs or organizations: Not on file    Relationship status: Not on file  . Intimate partner violence:    Fear of current or ex partner: Not on file    Emotionally abused: Not on file    Physically abused: Not on file    Forced sexual activity: Not on file  Other Topics Concern  . Not on file  Social History Narrative  . Not on file   Review of Systems  Constitutional: Negative for fatigue and unexpected weight change.  Eyes: Negative for visual disturbance.  Respiratory: Negative for cough, chest tightness and shortness  of breath.   Cardiovascular: Negative for chest pain, palpitations and leg swelling.  Gastrointestinal: Negative for abdominal pain and blood in stool.  Neurological: Negative for dizziness, light-headedness and headaches.      Objective:   Physical Exam  Constitutional: He is oriented to person, place, and time. He appears well-developed and well-nourished.  HENT:  Head: Normocephalic and atraumatic.  Eyes: Pupils are equal, round, and reactive to light. EOM are normal.  Neck: No JVD present. Carotid bruit is not present.  Cardiovascular: Normal rate, regular rhythm and normal heart sounds.  No murmur heard. Pulmonary/Chest: Effort normal and breath sounds normal. He has no rales.  Abdominal: Soft. There is no tenderness.  Musculoskeletal: He exhibits no edema.  Neurological: He is alert and oriented to person, place, and time.  Skin: Skin is warm and dry.  Psychiatric: He has a normal mood and affect.  Vitals reviewed.  Vitals:   02/17/18 1341  BP: 134/80  Pulse: 76  Temp: 97.9 F (36.6 C)  TempSrc: Oral  SpO2: 97%  Weight: 186 lb 6.4 oz (84.6 kg)  Height: 5' 8.5" (1.74 m)      Assessment & Plan:   [name] is a 67 y.o. male Elevated blood pressure reading  -Improved readings off meds.  Plans on increased exercise as above.  Continue to watch diet, including avoidance of excess sodium.  If readings elevated over 140/90, start HCTZ.  RTC precautions.  Need for influenza vaccination - Plan: Flu vaccine HIGH DOSE PF (Fluzone High dose)  Heartburn  -Trigger avoidance discussed, if persistent over-the-counter medication needed, return to discuss further.  No orders of the defined types were placed in this encounter.  Patient Instructions    Low intensity exercise and watch sodium in diet. Keep a record of your blood pressures outside of the office and if running over 140/90 - start the HCTZ.   See info on heartburn below. Try to avoid trigger foods. If  persistent need for meds for heartburn - return for recheck.      Food Choices for Gastroesophageal Reflux Disease, Adult When you have gastroesophageal reflux disease (GERD), the foods you eat and your eating habits are very important. Choosing the right foods can help ease your discomfort. What guidelines do I need to follow?  Choose fruits, vegetables, whole grains, and low-fat dairy products.  Choose low-fat meat, fish, and poultry.  Limit fats such as oils, salad dressings, butter, nuts, and avocado.  Keep a food diary. This helps you identify foods that cause symptoms.  Avoid foods that cause symptoms. These may be different for everyone.  Eat small meals often instead of  3 large meals a day.  Eat your meals slowly, in a place where you are relaxed.  Limit fried foods.  Cook foods using methods other than frying.  Avoid drinking alcohol.  Avoid drinking large amounts of liquids with your meals.  Avoid bending over or lying down until 2-3 hours after eating. What foods are not recommended? These are some foods and drinks that may make your symptoms worse: Vegetables Tomatoes. Tomato juice. Tomato and spaghetti sauce. Chili peppers. Onion and garlic. Horseradish. Fruits Oranges, grapefruit, and lemon (fruit and juice). Meats High-fat meats, fish, and poultry. This includes hot dogs, ribs, ham, sausage, salami, and bacon. Dairy Whole milk and chocolate milk. Sour cream. Cream. Butter. Ice cream. Cream cheese. Drinks Coffee and tea. Bubbly (carbonated) drinks or energy drinks. Condiments Hot sauce. Barbecue sauce. Sweets/Desserts Chocolate and cocoa. Donuts. Peppermint and spearmint. Fats and Oils High-fat foods. This includes [place] fries and potato chips. Other Vinegar. Strong spices. This includes black pepper, white pepper, red pepper, cayenne, curry powder, cloves, ginger, and chili powder. The items listed above may not be a complete list of foods and  drinks to avoid. Contact your dietitian for more information. This information is not intended to replace advice given to you by your health care provider. Make sure you discuss any questions you have with your health care provider. Document Released: 10/20/2011 Document Revised: 09/26/2015 Document Reviewed: 02/22/2013 Elsevier Interactive Patient Education  2017 Elsevier Inc.   Hypertension Hypertension, commonly called high blood pressure, is when the force of blood pumping through the arteries is too strong. The arteries are the blood vessels that carry blood from the heart throughout the body. Hypertension forces the heart to work harder to pump blood and may cause arteries to become narrow or stiff. Having untreated or uncontrolled hypertension can cause heart attacks, strokes, kidney disease, and other problems. A blood pressure reading consists of a higher number over a lower number. Ideally, your blood pressure should be below 120/80. The first ("top") number is called the systolic pressure. It is a measure of the pressure in your arteries as your heart beats. The second ("bottom") number is called the diastolic pressure. It is a measure of the pressure in your arteries as the heart relaxes. What are the causes? The cause of this condition is not known. What increases the risk? Some risk factors for high blood pressure are under your control. Others are not. Factors you can change  Smoking.  Having type 2 diabetes mellitus, high cholesterol, or both.  Not getting enough exercise or physical activity.  Being overweight.  Having too much fat, sugar, calories, or salt (sodium) in your diet.  Drinking too much alcohol. Factors that are difficult or impossible to change  Having chronic kidney disease.  Having a family history of high blood pressure.  Age. Risk increases with age.  Race. You may be at higher risk if you are African-American.  Gender. Men are at higher risk  than women before age 2. After age 31, women are at higher risk than men.  Having obstructive sleep apnea.  Stress. What are the signs or symptoms? Extremely high blood pressure (hypertensive crisis) may cause:  Headache.  Anxiety.  Shortness of breath.  Nosebleed.  Nausea and vomiting.  Severe chest pain.  Jerky movements you cannot control (seizures).  How is this diagnosed? This condition is diagnosed by measuring your blood pressure while you are seated, with your arm resting on a surface. The cuff of the blood  pressure monitor will be placed directly against the skin of your upper arm at the level of your heart. It should be measured at least twice using the same arm. Certain conditions can cause a difference in blood pressure between your right and left arms. Certain factors can cause blood pressure readings to be lower or higher than normal (elevated) for a short period of time:  When your blood pressure is higher when you are in a health care provider's office than when you are at home, this is called white coat hypertension. Most people with this condition do not need medicines.  When your blood pressure is higher at home than when you are in a health care provider's office, this is called masked hypertension. Most people with this condition may need medicines to control blood pressure.  If you have a high blood pressure reading during one visit or you have normal blood pressure with other risk factors:  You may be asked to return on a different day to have your blood pressure checked again.  You may be asked to monitor your blood pressure at home for 1 week or longer.  If you are diagnosed with hypertension, you may have other blood or imaging tests to help your health care provider understand your overall risk for other conditions. How is this treated? This condition is treated by making healthy lifestyle changes, such as eating healthy foods, exercising more, and  reducing your alcohol intake. Your health care provider may prescribe medicine if lifestyle changes are not enough to get your blood pressure under control, and if:  Your systolic blood pressure is above 130.  Your diastolic blood pressure is above 80.  Your personal target blood pressure may vary depending on your medical conditions, your age, and other factors. Follow these instructions at home: Eating and drinking  Eat a diet that is high in fiber and potassium, and low in sodium, added sugar, and fat. An example eating plan is called the DASH (Dietary Approaches to Stop Hypertension) diet. To eat this way: ? Eat plenty of fresh fruits and vegetables. Try to fill half of your plate at each meal with fruits and vegetables. ? Eat whole grains, such as whole wheat pasta, brown rice, or whole grain bread. Fill about one quarter of your plate with whole grains. ? Eat or drink low-fat dairy products, such as skim milk or low-fat yogurt. ? Avoid fatty cuts of meat, processed or cured meats, and poultry with skin. Fill about one quarter of your plate with lean proteins, such as fish, chicken without skin, beans, eggs, and tofu. ? Avoid premade and processed foods. These tend to be higher in sodium, added sugar, and fat.  Reduce your daily sodium intake. Most people with hypertension should eat less than 1,500 mg of sodium a day.  Limit alcohol intake to no more than 1 drink a day for nonpregnant women and 2 drinks a day for men. One drink equals 12 oz of beer, 5 oz of wine, or 1 oz of hard liquor. Lifestyle  Work with your health care provider to maintain a healthy body weight or to lose weight. Ask what an ideal weight is for you.  Get at least 30 minutes of exercise that causes your heart to beat faster (aerobic exercise) most days of the week. Activities may include walking, swimming, or biking.  Include exercise to strengthen your muscles (resistance exercise), such as pilates or lifting  weights, as part of your weekly exercise routine.  Try to do these types of exercises for 30 minutes at least 3 days a week.  Do not use any products that contain nicotine or tobacco, such as cigarettes and e-cigarettes. If you need help quitting, ask your health care provider.  Monitor your blood pressure at home as told by your health care provider.  Keep all follow-up visits as told by your health care provider. This is important. Medicines  Take over-the-counter and prescription medicines only as told by your health care provider. Follow directions carefully. Blood pressure medicines must be taken as prescribed.  Do not skip doses of blood pressure medicine. Doing this puts you at risk for problems and can make the medicine less effective.  Ask your health care provider about side effects or reactions to medicines that you should watch for. Contact a health care provider if:  You think you are having a reaction to a medicine you are taking.  You have headaches that keep coming back (recurring).  You feel dizzy.  You have swelling in your ankles.  You have trouble with your vision. Get help right away if:  You develop a severe headache or confusion.  You have unusual weakness or numbness.  You feel faint.  You have severe pain in your chest or abdomen.  You vomit repeatedly.  You have trouble breathing. Summary  Hypertension is when the force of blood pumping through your arteries is too strong. If this condition is not controlled, it may put you at risk for serious complications.  Your personal target blood pressure may vary depending on your medical conditions, your age, and other factors. For most people, a normal blood pressure is less than 120/80.  Hypertension is treated with lifestyle changes, medicines, or a combination of both. Lifestyle changes include weight loss, eating a healthy, low-sodium diet, exercising more, and limiting alcohol. This information is  not intended to replace advice given to you by your health care provider. Make sure you discuss any questions you have with your health care provider. Document Released: 04/20/2005 Document Revised: 03/18/2016 Document Reviewed: 03/18/2016 Elsevier Interactive Patient Education  [employer].   If you have lab work done today you will be contacted with your lab results within the next 2 weeks.  If you have not heard from [place] then please contact us. The fastest way to get your results is to register for My Chart.   IF you received an x-ray today, you will receive an invoice from [facility]. Please contact [facility] at [phone] with questions or concerns regarding your invoice.   IF you received labwork today, you will receive an invoice from [facility]. Please contact [facility] at [phone] with questions or concerns regarding your invoice.   Our billing staff will not be able to assist you with questions regarding bills from these companies.  You will be contacted with the lab results as soon as they are available. The fastest way to get your results is to activate your My Chart account. Instructions are located on the last page of this paperwork. If you have not heard from [place] regarding the results in 2 weeks, please contact this office.       I personally performed the services described in this documentation, which was scribed in my presence. The recorded information has been reviewed and considered for accuracy and completeness, addended by me as needed, and agree with information above.  Signed,   [name], MD [facility] [place].  02/20/18 10:28 PM

## 2018-02-17 NOTE — Patient Instructions (Addendum)
Low intensity exercise and watch sodium in diet. Keep a record of your blood pressures outside of the office and if running over 140/90 - start the HCTZ.   See info on heartburn below. Try to avoid trigger foods. If persistent need for meds for heartburn - return for recheck.      Food Choices for Gastroesophageal Reflux Disease, Adult When you have gastroesophageal reflux disease (GERD), the foods you eat and your eating habits are very important. Choosing the right foods can help ease your discomfort. What guidelines do I need to follow?  Choose fruits, vegetables, whole grains, and low-fat dairy products.  Choose low-fat meat, fish, and poultry.  Limit fats such as oils, salad dressings, butter, nuts, and avocado.  Keep a food diary. This helps you identify foods that cause symptoms.  Avoid foods that cause symptoms. These may be different for everyone.  Eat small meals often instead of 3 large meals a day.  Eat your meals slowly, in a place where you are relaxed.  Limit fried foods.  Cook foods using methods other than frying.  Avoid drinking alcohol.  Avoid drinking large amounts of liquids with your meals.  Avoid bending over or lying down until 2-3 hours after eating. What foods are not recommended? These are some foods and drinks that may make your symptoms worse: Vegetables Tomatoes. Tomato juice. Tomato and spaghetti sauce. Chili peppers. Onion and garlic. Horseradish. Fruits Oranges, grapefruit, and lemon (fruit and juice). Meats High-fat meats, fish, and poultry. This includes hot dogs, ribs, ham, sausage, salami, and bacon. Dairy Whole milk and chocolate milk. Sour cream. Cream. Butter. Ice cream. Cream cheese. Drinks Coffee and tea. Bubbly (carbonated) drinks or energy drinks. Condiments Hot sauce. Barbecue sauce. Sweets/Desserts Chocolate and cocoa. Donuts. Peppermint and spearmint. Fats and Oils High-fat foods. This includes [place] fries and potato  chips. Other Vinegar. Strong spices. This includes black pepper, white pepper, red pepper, cayenne, curry powder, cloves, ginger, and chili powder. The items listed above may not be a complete list of foods and drinks to avoid. Contact your dietitian for more information. This information is not intended to replace advice given to you by your health care provider. Make sure you discuss any questions you have with your health care provider. Document Released: 10/20/2011 Document Revised: 09/26/2015 Document Reviewed: 02/22/2013 Elsevier Interactive Patient Education  2017 Elsevier Inc.   Hypertension Hypertension, commonly called high blood pressure, is when the force of blood pumping through the arteries is too strong. The arteries are the blood vessels that carry blood from the heart throughout the body. Hypertension forces the heart to work harder to pump blood and may cause arteries to become narrow or stiff. Having untreated or uncontrolled hypertension can cause heart attacks, strokes, kidney disease, and other problems. A blood pressure reading consists of a higher number over a lower number. Ideally, your blood pressure should be below 120/80. The first ("top") number is called the systolic pressure. It is a measure of the pressure in your arteries as your heart beats. The second ("bottom") number is called the diastolic pressure. It is a measure of the pressure in your arteries as the heart relaxes. What are the causes? The cause of this condition is not known. What increases the risk? Some risk factors for high blood pressure are under your control. Others are not. Factors you can change  Smoking.  Having type 2 diabetes mellitus, high cholesterol, or both.  Not getting enough exercise or physical activity.  Being  overweight.  Having too much fat, sugar, calories, or salt (sodium) in your diet.  Drinking too much alcohol. Factors that are difficult or impossible to  change  Having chronic kidney disease.  Having a family history of high blood pressure.  Age. Risk increases with age.  Race. You may be at higher risk if you are African-American.  Gender. Men are at higher risk than women before age 23. After age 61, women are at higher risk than men.  Having obstructive sleep apnea.  Stress. What are the signs or symptoms? Extremely high blood pressure (hypertensive crisis) may cause:  Headache.  Anxiety.  Shortness of breath.  Nosebleed.  Nausea and vomiting.  Severe chest pain.  Jerky movements you cannot control (seizures).  How is this diagnosed? This condition is diagnosed by measuring your blood pressure while you are seated, with your arm resting on a surface. The cuff of the blood pressure monitor will be placed directly against the skin of your upper arm at the level of your heart. It should be measured at least twice using the same arm. Certain conditions can cause a difference in blood pressure between your right and left arms. Certain factors can cause blood pressure readings to be lower or higher than normal (elevated) for a short period of time:  When your blood pressure is higher when you are in a health care provider's office than when you are at home, this is called white coat hypertension. Most people with this condition do not need medicines.  When your blood pressure is higher at home than when you are in a health care provider's office, this is called masked hypertension. Most people with this condition may need medicines to control blood pressure.  If you have a high blood pressure reading during one visit or you have normal blood pressure with other risk factors:  You may be asked to return on a different day to have your blood pressure checked again.  You may be asked to monitor your blood pressure at home for 1 week or longer.  If you are diagnosed with hypertension, you may have other blood or imaging tests  to help your health care provider understand your overall risk for other conditions. How is this treated? This condition is treated by making healthy lifestyle changes, such as eating healthy foods, exercising more, and reducing your alcohol intake. Your health care provider may prescribe medicine if lifestyle changes are not enough to get your blood pressure under control, and if:  Your systolic blood pressure is above 130.  Your diastolic blood pressure is above 80.  Your personal target blood pressure may vary depending on your medical conditions, your age, and other factors. Follow these instructions at home: Eating and drinking  Eat a diet that is high in fiber and potassium, and low in sodium, added sugar, and fat. An example eating plan is called the DASH (Dietary Approaches to Stop Hypertension) diet. To eat this way: ? Eat plenty of fresh fruits and vegetables. Try to fill half of your plate at each meal with fruits and vegetables. ? Eat whole grains, such as whole wheat pasta, brown rice, or whole grain bread. Fill about one quarter of your plate with whole grains. ? Eat or drink low-fat dairy products, such as skim milk or low-fat yogurt. ? Avoid fatty cuts of meat, processed or cured meats, and poultry with skin. Fill about one quarter of your plate with lean proteins, such as fish, chicken without skin,  beans, eggs, and tofu. ? Avoid premade and processed foods. These tend to be higher in sodium, added sugar, and fat.  Reduce your daily sodium intake. Most people with hypertension should eat less than 1,500 mg of sodium a day.  Limit alcohol intake to no more than 1 drink a day for nonpregnant women and 2 drinks a day for men. One drink equals 12 oz of beer, 5 oz of wine, or 1 oz of hard liquor. Lifestyle  Work with your health care provider to maintain a healthy body weight or to lose weight. Ask what an ideal weight is for you.  Get at least 30 minutes of exercise that  causes your heart to beat faster (aerobic exercise) most days of the week. Activities may include walking, swimming, or biking.  Include exercise to strengthen your muscles (resistance exercise), such as pilates or lifting weights, as part of your weekly exercise routine. Try to do these types of exercises for 30 minutes at least 3 days a week.  Do not use any products that contain nicotine or tobacco, such as cigarettes and e-cigarettes. If you need help quitting, ask your health care provider.  Monitor your blood pressure at home as told by your health care provider.  Keep all follow-up visits as told by your health care provider. This is important. Medicines  Take over-the-counter and prescription medicines only as told by your health care provider. Follow directions carefully. Blood pressure medicines must be taken as prescribed.  Do not skip doses of blood pressure medicine. Doing this puts you at risk for problems and can make the medicine less effective.  Ask your health care provider about side effects or reactions to medicines that you should watch for. Contact a health care provider if:  You think you are having a reaction to a medicine you are taking.  You have headaches that keep coming back (recurring).  You feel dizzy.  You have swelling in your ankles.  You have trouble with your vision. Get help right away if:  You develop a severe headache or confusion.  You have unusual weakness or numbness.  You feel faint.  You have severe pain in your chest or abdomen.  You vomit repeatedly.  You have trouble breathing. Summary  Hypertension is when the force of blood pumping through your arteries is too strong. If this condition is not controlled, it may put you at risk for serious complications.  Your personal target blood pressure may vary depending on your medical conditions, your age, and other factors. For most people, a normal blood pressure is less than  120/80.  Hypertension is treated with lifestyle changes, medicines, or a combination of both. Lifestyle changes include weight loss, eating a healthy, low-sodium diet, exercising more, and limiting alcohol. This information is not intended to replace advice given to you by your health care provider. Make sure you discuss any questions you have with your health care provider. Document Released: 04/20/2005 Document Revised: 03/18/2016 Document Reviewed: 03/18/2016 Elsevier Interactive Patient Education  [employer].   If you have lab work done today you will be contacted with your lab results within the next 2 weeks.  If you have not heard from [place] then please contact us. The fastest way to get your results is to register for My Chart.   IF you received an x-ray today, you will receive an invoice from [facility]. Please contact [facility] at [phone] with questions or concerns regarding your invoice.  IF you received labwork today, you will receive an invoice from [facility]. Please contact [facility] at [phone] with questions or concerns regarding your invoice.   Our billing staff will not be able to assist you with questions regarding bills from these companies.  You will be contacted with the lab results as soon as they are available. The fastest way to get your results is to activate your My Chart account. Instructions are located on the last page of this paperwork. If you have not heard from [place] regarding the results in 2 weeks, please contact this office.

## 2018-02-20 ENCOUNTER — Encounter: Payer: Self-pay | Admitting: Family Medicine

## 2018-05-18 DIAGNOSIS — R251 Tremor, unspecified: Secondary | ICD-10-CM | POA: Diagnosis not present

## 2018-05-18 DIAGNOSIS — G5613 Other lesions of median nerve, bilateral upper limbs: Secondary | ICD-10-CM | POA: Diagnosis not present

## 2018-05-19 DIAGNOSIS — G5603 Carpal tunnel syndrome, bilateral upper limbs: Secondary | ICD-10-CM | POA: Diagnosis not present

## 2018-07-13 ENCOUNTER — Telehealth: Payer: Self-pay | Admitting: *Deleted

## 2018-07-20 NOTE — Telephone Encounter (Signed)
Close visit

## 2018-08-25 ENCOUNTER — Ambulatory Visit (INDEPENDENT_AMBULATORY_CARE_PROVIDER_SITE_OTHER): Payer: Medicare HMO | Admitting: Family Medicine

## 2018-08-25 ENCOUNTER — Other Ambulatory Visit: Payer: Self-pay

## 2018-08-25 ENCOUNTER — Telehealth: Payer: Self-pay | Admitting: *Deleted

## 2018-08-25 VITALS — BP 134/80 | Ht 68.5 in | Wt 186.0 lb

## 2018-08-25 DIAGNOSIS — Z0000 Encounter for general adult medical examination without abnormal findings: Secondary | ICD-10-CM

## 2018-08-25 NOTE — Patient Instructions (Signed)
Healthy Eating Following a healthy eating pattern may help you to achieve and maintain a healthy body weight, reduce the risk of chronic disease, and live a long and productive life. It is important to follow a healthy eating pattern at an appropriate calorie level for your body. Your nutritional needs should be met primarily through food by choosing a variety of nutrient-rich foods. What are tips for following this plan? Reading food labels  Read labels and choose the following: ? Reduced or low sodium. ? Juices with 100% fruit juice. ? Foods with low saturated fats and high polyunsaturated and monounsaturated fats. ? Foods with whole grains, such as whole wheat, cracked wheat, brown rice, and wild rice. ? Whole grains that are fortified with folic acid. This is recommended for women who are pregnant or who want to become pregnant.  Read labels and avoid the following: ? Foods with a lot of added sugars. These include foods that contain brown sugar, corn sweetener, corn syrup, dextrose, fructose, glucose, high-fructose corn syrup, honey, invert sugar, lactose, malt syrup, maltose, molasses, raw sugar, sucrose, trehalose, or turbinado sugar.  Do not eat more than the following amounts of added sugar per day:  6 teaspoons (25 g) for women.  9 teaspoons (38 g) for men. ? Foods that contain processed or refined starches and grains. ? Refined grain products, such as white flour, degermed cornmeal, white bread, and white rice. Shopping  Choose nutrient-rich snacks, such as vegetables, whole fruits, and nuts. Avoid high-calorie and high-sugar snacks, such as potato chips, fruit snacks, and candy.  Use oil-based dressings and spreads on foods instead of solid fats such as butter, stick margarine, or cream cheese.  Limit pre-made sauces, mixes, and "instant" products such as flavored rice, instant noodles, and ready-made pasta.  Try more plant-protein sources, such as tofu, tempeh, black beans,  edamame, lentils, nuts, and seeds.  Explore eating plans such as the Mediterranean diet or vegetarian diet. Cooking  Use oil to saut or stir-fry foods instead of solid fats such as butter, stick margarine, or lard.  Try baking, boiling, grilling, or broiling instead of frying.  Remove the fatty part of meats before cooking.  Steam vegetables in water or broth. Meal planning   At meals, imagine dividing your plate into fourths: ? One-half of your plate is fruits and vegetables. ? One-fourth of your plate is whole grains. ? One-fourth of your plate is protein, especially lean meats, poultry, eggs, tofu, beans, or nuts.  Include low-fat dairy as part of your daily diet. Lifestyle  Choose healthy options in all settings, including home, work, school, restaurants, or stores.  Prepare your food safely: ? Wash your hands after handling raw meats. ? Keep food preparation surfaces clean by regularly washing with hot, soapy water. ? Keep raw meats separate from ready-to-eat foods, such as fruits and vegetables. ? Cook seafood, meat, poultry, and eggs to the recommended internal temperature. ? Store foods at safe temperatures. In general:  Keep cold foods at 59F (4.4C) or below.  Keep hot foods at 159F (60C) or above.  Keep your freezer at [facility] (-17.8C) or below.  Foods are no longer safe to eat when they have been between the temperatures of 40-159F (4.4-60C) for more than 2 hours. What foods should I eat? Fruits Aim to eat 2 cup-equivalents of fresh, canned (in natural juice), or frozen fruits each day. Examples of 1 cup-equivalent of fruit include 1 small apple, 8 large strawberries, 1 cup canned fruit,  cup  dried fruit, or 1 cup 100% juice. Vegetables Aim to eat 2-3 cup-equivalents of fresh and frozen vegetables each day, including different varieties and colors. Examples of 1 cup-equivalent of vegetables include 2 medium carrots, 2 cups raw, leafy greens, 1 cup chopped  vegetable (raw or cooked), or 1 medium baked potato. Grains Aim to eat 6 ounce-equivalents of whole grains each day. Examples of 1 ounce-equivalent of grains include 1 slice of bread, 1 cup ready-to-eat cereal, 3 cups popcorn, or  cup cooked rice, pasta, or cereal. Meats and other proteins Aim to eat 5-6 ounce-equivalents of protein each day. Examples of 1 ounce-equivalent of protein include 1 egg, 1/2 cup nuts or seeds, or 1 tablespoon (16 g) peanut butter. A cut of meat or fish that is the size of a deck of cards is about 3-4 ounce-equivalents.  Of the protein you eat each week, try to have at least 8 ounces come from seafood. This includes salmon, trout, herring, and anchovies. Dairy Aim to eat 3 cup-equivalents of fat-free or low-fat dairy each day. Examples of 1 cup-equivalent of dairy include 1 cup (240 mL) milk, 8 ounces (250 g) yogurt, 1 ounces (44 g) natural cheese, or 1 cup (240 mL) fortified soy milk. Fats and oils  Aim for about 5 teaspoons (21 g) per day. Choose monounsaturated fats, such as canola and olive oils, avocados, peanut butter, and most nuts, or polyunsaturated fats, such as sunflower, corn, and soybean oils, walnuts, pine nuts, sesame seeds, sunflower seeds, and flaxseed. Beverages  Aim for six 8-oz glasses of water per day. Limit coffee to three to five 8-oz cups per day.  Limit caffeinated beverages that have added calories, such as soda and energy drinks.  Limit alcohol intake to no more than 1 drink a day for nonpregnant women and 2 drinks a day for men. One drink equals 12 oz of beer (355 mL), 5 oz of wine (148 mL), or 1 oz of hard liquor (44 mL). Seasoning and other foods  Avoid adding excess amounts of salt to your foods. Try flavoring foods with herbs and spices instead of salt.  Avoid adding sugar to foods.  Try using oil-based dressings, sauces, and spreads instead of solid fats. This information is based on general [place] nutrition guidelines. For more  information, visit [employer]. Exact amounts may vary based on your nutrition needs. Summary  A healthy eating plan may help you to maintain a healthy weight, reduce the risk of chronic diseases, and stay active throughout your life.  Plan your meals. Make sure you eat the right portions of a variety of nutrient-rich foods.  Try baking, boiling, grilling, or broiling instead of frying.  Choose healthy options in all settings, including home, work, school, restaurants, or stores. This information is not intended to replace advice given to you by your health care provider. Make sure you discuss any questions you have with your health care provider. Document Released: 08/02/2017 Document Revised: 08/02/2017 Document Reviewed: 08/02/2017 Elsevier Interactive Patient Education  2019 [place] Directive  Advance directives are legal documents that let you make choices ahead of time about your health care and medical treatment in case you become unable to communicate for yourself. Advance directives are a way for you to communicate your wishes to family, friends, and health care providers. This can help convey your decisions about end-of-life care if you become unable to communicate. Discussing and writing advance directives should happen over time rather than all at once. Advance directives can  Advance directives are legal documents that let you make choices ahead of time about your health care and medical treatment in case you become unable to communicate for yourself. Advance directives are a way for you to communicate your wishes to family, friends, and health care providers. This can help convey your decisions about end-of-life care if you become unable to communicate.  Discussing and writing advance directives should happen over time rather than all at once. Advance directives can be changed depending on your situation and what you want, even after you have signed the advance directives.  If you do not have an advance directive, some states assign family decision makers to act on your behalf based on how closely you are related to them. Each state has its own laws regarding advance directives. You may want to check with your health care provider, attorney, or state representative about the laws in your state. There are different types of advance directives, such as:  · Medical power of attorney.  · Living will.  · Do not resuscitate (DNR) or do not attempt resuscitation  (DNAR) order.  Health care proxy and medical power of attorney  A health care proxy, also called a health care agent, is a person who is appointed to make medical decisions for you in cases in which you are unable to make the decisions yourself. Generally, people choose someone they know well and trust to represent their preferences. Make sure to ask this person for an agreement to act as your proxy. A proxy may have to exercise judgment in the event of a medical decision for which your wishes are not known.  A medical power of attorney is a legal document that names your health care proxy. Depending on the laws in your state, after the document is written, it may also need to be:  · Signed.  · Notarized.  · Dated.  · Copied.  · Witnessed.  · Incorporated into your medical record.  You may also want to appoint someone to manage your financial affairs in a situation in which you are unable to do so. This is called a durable power of attorney for finances. It is a separate legal document from the durable power of attorney for health care. You may choose the same person or someone different from your health care proxy to act as your agent in financial matters.  If you do not appoint a proxy, or if there is a concern that the proxy is not acting in your best interests, a court-appointed guardian may be designated to act on your behalf.  Living will  A living will is a set of instructions documenting your wishes about medical care when you cannot express them yourself. Health care providers should keep a copy of your living will in your medical record. You may want to give a copy to family members or friends. To alert caregivers in case of an emergency, you can place a card in your wallet to let them know that you have a living will and where they can find it. A living will is used if you become:  · Terminally ill.  · Incapacitated.  · Unable to communicate or make decisions.  Items to consider in your living will  include:  · The use or non-use of life-sustaining equipment, such as dialysis machines and breathing machines (ventilators).  · A DNR or DNAR order, which is the instruction not to use cardiopulmonary resuscitation (CPR) if breathing or heartbeat stops.  ·   The use or non-use of tube feeding.  · Withholding of food and fluids.  · Comfort (palliative) care when the goal becomes comfort rather than a cure.  · Organ and tissue donation.  A living will does not give instructions for distributing your money and property if you should pass away. It is recommended that you seek the advice of a lawyer when writing a will. Decisions about taxes, beneficiaries, and asset distribution will be legally binding. This process can relieve your family and friends of any concerns surrounding disputes or questions that may come up about the distribution of your assets.  DNR or DNAR  A DNR or DNAR order is a request not to have CPR in the event that your heart stops beating or you stop breathing. If a DNR or DNAR order has not been made and shared, a health care provider will try to help any patient whose heart has stopped or who has stopped breathing. If you plan to have surgery, talk with your health care provider about how your DNR or DNAR order will be followed if problems occur.  Summary  · Advance directives are the legal documents that allow you to make choices ahead of time about your health care and medical treatment in case you become unable to communicate for yourself.  · The process of discussing and writing advance directives should happen over time. You can change the advance directives, even after you have signed them.  · Advance directives include DNR or DNAR orders, living wills, and designating an agent as your medical power of attorney.  This information is not intended to replace advice given to you by your health care provider. Make sure you discuss any questions you have with your health care provider.  Document  Released: 07/28/2007 Document Revised: 03/09/2016 Document Reviewed: 03/09/2016  Elsevier Interactive Patient Education © 2019 Elsevier Inc.

## 2018-08-25 NOTE — Telephone Encounter (Signed)
Called patient for 11;00 appointment AWV no answer

## 2018-08-25 NOTE — Progress Notes (Signed)
Presents today for [employer] Visit   Date of last exam: 02/17/2018  Interpreter used for this visit? no  Patient Care Team: [name], MD as PCP - General (Family Medicine)   Other items to address today:  Discussed Colonoscopy due in June 2020 Discussed Eye/Dental yearly exams Discussed Shingrix  Patient taking BP at home running 130/84 discussed follow up appointment    Other Screening:   Last lipid screening: 11/12/2016  ADVANCE DIRECTIVES: Discussed: yes On File: no Materials Provided: yes (mailed)  Immunization status:  Immunization History  Administered Date(s) Administered  . Influenza, High Dose Seasonal PF 02/17/2018  . Influenza,inj,Quad PF,6+ Mos 12/25/2013, 05/11/2016, 04/19/2017  . Pneumococcal Conjugate-13 12/25/2013  . Pneumococcal Polysaccharide-23 03/16/2014  . Tdap 09/08/2012     There are no preventive care reminders to display for this patient.   Functional Status Survey: Is the patient deaf or have difficulty hearing?: No Does the patient have difficulty seeing, even when wearing glasses/contacts?: No Does the patient have difficulty concentrating, remembering, or making decisions?: No Does the patient have difficulty walking or climbing stairs?: No Does the patient have difficulty dressing or bathing?: No Does the patient have difficulty doing errands alone such as visiting a doctor's office or shopping?: No   6CIT Screen 08/25/2018  What Year? 0 points  What month? 0 points  What time? 0 points  Count back from 20 0 points  Months in reverse 0 points  Repeat phrase 0 points  Total Score 0        Clinical Support from 08/25/2018 in [facility]  AUDIT-C Score  1       Home Environment:    Live in one story home  With wife  No trouble climbing stairs  Grab bars yes No scatter  Rugs Well lit..  Doing lots of yard work house repairs    Patient Active Problem List   Diagnosis  Date Noted  . Renal cyst, right 09/05/2015     Past Medical History:  Diagnosis Date  . Allergy   . Asthma      Past Surgical History:  Procedure Laterality Date  . HERNIA REPAIR       Family History  Problem Relation Age of Onset  . Cancer Father   . Diabetes Father      Social History   Socioeconomic History  . Marital status: Married    Spouse name: Not on file  . Number of children: Not on file  . Years of education: Not on file  . Highest education level: Not on file  Occupational History  . Not on file  Social Needs  . Financial resource strain: Not on file  . Food insecurity:    Worry: Not on file    Inability: Not on file  . Transportation needs:    Medical: Not on file    Non-medical: Not on file  Tobacco Use  . Smoking status: Never Smoker  . Smokeless tobacco: Never Used  Substance and Sexual Activity  . Alcohol use: No  . Drug use: No  . Sexual activity: Never  Lifestyle  . Physical activity:    Days per week: Not on file    Minutes per session: Not on file  . Stress: Not on file  Relationships  . Social connections:    Talks on phone: Not on file    Gets together: Not on file    Attends religious service: Not on file  Active member of club or organization: Not on file    Attends meetings of clubs or organizations: Not on file    Relationship status: Not on file  . Intimate partner violence:    Fear of current or ex partner: Not on file    Emotionally abused: Not on file    Physically abused: Not on file    Forced sexual activity: Not on file  Other Topics Concern  . Not on file  Social History Narrative  . Not on file     No Known Allergies   Prior to Admission medications   Medication Sig Start Date End Date Taking? Authorizing Provider  albuterol (PROVENTIL HFA;VENTOLIN HFA) 108 (90 Base) MCG/ACT inhaler Inhale 1-2 puffs into the lungs every 4 (four) hours as needed for wheezing or shortness of breath. 11/30/17  Yes  [name], MD  Multiple Vitamin (MULTIVITAMIN) tablet Take 1 tablet by mouth.   Yes [provider]  vitamin C (ASCORBIC ACID) 500 MG tablet Take 500 mg by mouth daily.   Yes [provider]  ibuprofen (ADVIL,MOTRIN) 200 MG tablet Take 200 mg by mouth every 6 (six) hours as needed for pain.    [provider]     Depression screen [facility] 2/9 08/25/2018 02/17/2018 12/21/2017 11/30/2017 01/21/2017  Decreased Interest 0 0 0 0 0  Down, Depressed, Hopeless 0 0 0 0 0  PHQ - 2 Score 0 0 0 0 0     Fall Risk  08/25/2018 02/17/2018 12/21/2017 11/30/2017 01/21/2017  Falls in the past year? 0 No No No No  Comment - - - - -  Number falls in past yr: 0 - - - -  Injury with Fall? 0 - - - -      PHYSICAL EXAM: BP 134/80   Ht 5' 8.5" (1.74 m)   Wt 186 lb (84.4 kg)   BMI 27.87 kg/m    Wt Readings from Last 3 Encounters:  08/25/18 186 lb (84.4 kg)  02/17/18 186 lb 6.4 oz (84.6 kg)  12/21/17 184 lb (83.5 kg)     No exam data present    Physical Exam   Education/Counseling provided regarding diet and exercise, prevention of chronic diseases, smoking/tobacco cessation, if applicable, and reviewed "Covered Medicare Preventive Services."   ASSESSMENT/PLAN: 1. Medicare annual wellness visit, subsequent

## 2018-09-19 NOTE — Progress Notes (Signed)
Presents today for [employer] Visit  I connected with  Alan Ball on 09/19/18 by a video enabled telemedicine application and verified that I am speaking with the correct person using two identifiers.   The patient expressed understanding and agreed to proceed.    Date of last exam: 02/17/2018  Interpreter used for this visit? no  Patient Care Team: [name], MD as PCP - General (Family Medicine)   Other items to address today:  Discussed Colonoscopy due in June 2020 Discussed Eye/Dental yearly exams Discussed Shingrix  Patient taking BP at home running 130/84 discussed follow up appointment    Other Screening:   Last lipid screening: 11/12/2016  ADVANCE DIRECTIVES: Discussed: yes On File: no Materials Provided: yes (mailed)  Immunization status:  Immunization History  Administered Date(s) Administered  . Influenza, High Dose Seasonal PF 02/17/2018  . Influenza,inj,Quad PF,6+ Mos 12/25/2013, 05/11/2016, 04/19/2017  . Pneumococcal Conjugate-13 12/25/2013  . Pneumococcal Polysaccharide-23 03/16/2014  . Tdap 09/08/2012     There are no preventive care reminders to display for this patient.   Functional Status Survey: Is the patient deaf or have difficulty hearing?: No Does the patient have difficulty seeing, even when wearing glasses/contacts?: No Does the patient have difficulty concentrating, remembering, or making decisions?: No Does the patient have difficulty walking or climbing stairs?: No Does the patient have difficulty dressing or bathing?: No Does the patient have difficulty doing errands alone such as visiting a doctor's office or shopping?: No   6CIT Screen 08/25/2018  What Year? 0 points  What month? 0 points  What time? 0 points  Count back from 20 0 points  Months in reverse 0 points  Repeat phrase 0 points  Total Score 0        Office Visit from 08/25/2018 in [facility]  AUDIT-C Score  1        Home Environment:    Live in one story home  With wife  No trouble climbing stairs  Grab bars yes No scatter  Rugs Well lit..  Doing lots of yard work house repairs    Patient Active Problem List   Diagnosis Date Noted  . Renal cyst, right 09/05/2015     Past Medical History:  Diagnosis Date  . Allergy   . Asthma      Past Surgical History:  Procedure Laterality Date  . HERNIA REPAIR       Family History  Problem Relation Age of Onset  . Cancer Father   . Diabetes Father      Social History   Socioeconomic History  . Marital status: Married    Spouse name: Not on file  . Number of children: Not on file  . Years of education: Not on file  . Highest education level: Not on file  Occupational History  . Not on file  Social Needs  . Financial resource strain: Not on file  . Food insecurity:    Worry: Not on file    Inability: Not on file  . Transportation needs:    Medical: Not on file    Non-medical: Not on file  Tobacco Use  . Smoking status: Never Smoker  . Smokeless tobacco: Never Used  Substance and Sexual Activity  . Alcohol use: No  . Drug use: No  . Sexual activity: Never  Lifestyle  . Physical activity:    Days per week: Not on file    Minutes per session: Not  on file  . Stress: Not on file  Relationships  . Social connections:    Talks on phone: Not on file    Gets together: Not on file    Attends religious service: Not on file    Active member of club or organization: Not on file    Attends meetings of clubs or organizations: Not on file    Relationship status: Not on file  . Intimate partner violence:    Fear of current or ex partner: Not on file    Emotionally abused: Not on file    Physically abused: Not on file    Forced sexual activity: Not on file  Other Topics Concern  . Not on file  Social History Narrative  . Not on file     No Known Allergies   Prior to Admission medications   Medication Sig Start  Date End Date Taking? Authorizing Provider  albuterol (PROVENTIL HFA;VENTOLIN HFA) 108 (90 Base) MCG/ACT inhaler Inhale 1-2 puffs into the lungs every 4 (four) hours as needed for wheezing or shortness of breath. 11/30/17  Yes [name], MD  Multiple Vitamin (MULTIVITAMIN) tablet Take 1 tablet by mouth.   Yes [provider]  vitamin C (ASCORBIC ACID) 500 MG tablet Take 500 mg by mouth daily.   Yes [provider]  ibuprofen (ADVIL,MOTRIN) 200 MG tablet Take 200 mg by mouth every 6 (six) hours as needed for pain.    [provider]     Depression screen [facility] 2/9 08/25/2018 02/17/2018 12/21/2017 11/30/2017 01/21/2017  Decreased Interest 0 0 0 0 0  Down, Depressed, Hopeless 0 0 0 0 0  PHQ - 2 Score 0 0 0 0 0     Fall Risk  08/25/2018 02/17/2018 12/21/2017 11/30/2017 01/21/2017  Falls in the past year? 0 No No No No  Comment - - - - -  Number falls in past yr: 0 - - - -  Injury with Fall? 0 - - - -      PHYSICAL EXAM: BP 134/80   Ht 5' 8.5" (1.74 m)   Wt 186 lb (84.4 kg)   BMI 27.87 kg/m    Wt Readings from Last 3 Encounters:  08/25/18 186 lb (84.4 kg)  02/17/18 186 lb 6.4 oz (84.6 kg)  12/21/17 184 lb (83.5 kg)     No exam data present    Physical Exam   Education/Counseling provided regarding diet and exercise, prevention of chronic diseases, smoking/tobacco cessation, if applicable, and reviewed "Covered Medicare Preventive Services."   ASSESSMENT/PLAN: 1. Medicare annual wellness visit, subsequent

## 2018-10-05 DIAGNOSIS — N401 Enlarged prostate with lower urinary tract symptoms: Secondary | ICD-10-CM | POA: Diagnosis not present

## 2018-10-05 DIAGNOSIS — R972 Elevated prostate specific antigen [PSA]: Secondary | ICD-10-CM | POA: Diagnosis not present

## 2018-10-05 DIAGNOSIS — R3915 Urgency of urination: Secondary | ICD-10-CM | POA: Diagnosis not present

## 2018-10-18 DIAGNOSIS — G5603 Carpal tunnel syndrome, bilateral upper limbs: Secondary | ICD-10-CM | POA: Diagnosis not present

## 2018-10-26 DIAGNOSIS — G5602 Carpal tunnel syndrome, left upper limb: Secondary | ICD-10-CM | POA: Diagnosis not present

## 2018-11-01 DIAGNOSIS — M25532 Pain in left wrist: Secondary | ICD-10-CM | POA: Diagnosis not present

## 2018-11-01 DIAGNOSIS — M25632 Stiffness of left wrist, not elsewhere classified: Secondary | ICD-10-CM | POA: Diagnosis not present

## 2018-11-01 DIAGNOSIS — M25432 Effusion, left wrist: Secondary | ICD-10-CM | POA: Diagnosis not present

## 2018-11-01 DIAGNOSIS — G5603 Carpal tunnel syndrome, bilateral upper limbs: Secondary | ICD-10-CM | POA: Diagnosis not present

## 2018-12-05 ENCOUNTER — Encounter: Payer: Self-pay | Admitting: *Deleted

## 2018-12-06 ENCOUNTER — Telehealth: Payer: Self-pay | Admitting: Diagnostic Neuroimaging

## 2018-12-06 ENCOUNTER — Ambulatory Visit (INDEPENDENT_AMBULATORY_CARE_PROVIDER_SITE_OTHER): Payer: Medicare HMO | Admitting: Diagnostic Neuroimaging

## 2018-12-06 ENCOUNTER — Other Ambulatory Visit: Payer: Self-pay

## 2018-12-06 ENCOUNTER — Encounter: Payer: Self-pay | Admitting: Diagnostic Neuroimaging

## 2018-12-06 VITALS — BP 155/91 | HR 65 | Temp 98.2°F | Ht 69.0 in | Wt 187.0 lb

## 2018-12-06 DIAGNOSIS — G252 Other specified forms of tremor: Secondary | ICD-10-CM

## 2018-12-06 DIAGNOSIS — R259 Unspecified abnormal involuntary movements: Secondary | ICD-10-CM | POA: Diagnosis not present

## 2018-12-06 MED ORDER — CARBIDOPA-LEVODOPA 25-100 MG PO TABS: 0.5000 | ORAL_TABLET | Freq: Three times a day (TID) | ORAL | 6 refills | Status: DC

## 2018-12-06 NOTE — Patient Instructions (Signed)
-   check MRI brain  - start carb/levo half tab three times a day with meals; after 1-2 weeks increase to 1 tab three times a day with meals

## 2018-12-06 NOTE — Progress Notes (Signed)
[facility]  PATIENT: Alan Ball DOB: Sep 23, 1950  REFERRING CLINICIAN: Marzetta Merino HISTORY FROM: patient and wife  REASON FOR VISIT: new consult    HISTORICAL  CHIEF COMPLAINT:  Chief Complaint  Patient presents with  . Tremors    rm 7 New Pt, wife- Romie Minus, "tremor in left arm began about 3-4 months ago"    HISTORY OF PRESENT ILLNESS:   68 year old male here for evaluation of left hand tremor.  Symptoms started 4 months ago, gradual onset and progressive.  He notes resting tremor mainly in his left hand and arm.  This affects him randomly throughout the day.  Symptoms worsening over time.  He notes this when he is walking.  Sometimes his left wrist flexes.  No problem with right hand.  No problem with legs.  No balance or walking issues.  No change in smell or taste.  No vivid dreams or acting out during sleep.  No family history of tremor.   REVIEW OF SYSTEMS: Full 14 system review of systems performed and negative with exception of: As per HPI.   ALLERGIES: No Known Allergies  HOME MEDICATIONS: Outpatient Medications Prior to Visit  Medication Sig Dispense Refill  . albuterol (PROVENTIL HFA;VENTOLIN HFA) 108 (90 Base) MCG/ACT inhaler Inhale 1-2 puffs into the lungs every 4 (four) hours as needed for wheezing or shortness of breath. 1 Inhaler 0  . ibuprofen (ADVIL,MOTRIN) 200 MG tablet Take 200 mg by mouth every 6 (six) hours as needed for pain.    . Multiple Vitamin (MULTIVITAMIN) tablet Take 1 tablet by mouth.    . vitamin C (ASCORBIC ACID) 500 MG tablet Take 500 mg by mouth daily.     No facility-administered medications prior to visit.     PAST MEDICAL HISTORY: Past Medical History:  Diagnosis Date  . Allergy   . Asthma   . Tremor     PAST SURGICAL HISTORY: Past Surgical History:  Procedure Laterality Date  . CARPAL TUNNEL RELEASE Bilateral 2019, 2020  . HERNIA REPAIR Bilateral    inguinal  . SHOULDER SURGERY      FAMILY  HISTORY: Family History  Problem Relation Age of Onset  . Cancer Father   . Diabetes Father     SOCIAL HISTORY: Social History   Socioeconomic History  . Marital status: Married    Spouse name: Romie Minus  . Number of children: Not on file  . Years of education: Not on file  . Highest education level: Bachelor's degree (e.g., BA, AB, BS)  Occupational History    Comment: retired  Scientific laboratory technician  . Financial resource strain: Not on file  . Food insecurity    Worry: Not on file    Inability: Not on file  . Transportation needs    Medical: Not on file    Non-medical: Not on file  Tobacco Use  . Smoking status: Never Smoker  . Smokeless tobacco: Never Used  Substance and Sexual Activity  . Alcohol use: No  . Drug use: No  . Sexual activity: Never  Lifestyle  . Physical activity    Days per week: Not on file    Minutes per session: Not on file  . Stress: Not on file  Relationships  . Social Herbalist on phone: Not on file    Gets together: Not on file    Attends religious service: Not on file    Active member of club or organization: Not on file  Attends meetings of clubs or organizations: Not on file    Relationship status: Not on file  . Intimate partner violence    Fear of current or ex partner: Not on file    Emotionally abused: Not on file    Physically abused: Not on file    Forced sexual activity: Not on file  Other Topics Concern  . Not on file  Social History Narrative   Lives with wife   Caffeine- coffee 3 cups daily, maybe a soda     PHYSICAL EXAM  GENERAL EXAM/CONSTITUTIONAL: Vitals:  Vitals:   12/06/18 0921  BP: (!) 155/91  Pulse: 65  Temp: 98.2 F (36.8 C)  Weight: 187 lb (84.8 kg)  Height: 5\' 9"  (1.753 m)     Body mass index is 27.62 kg/m. Wt Readings from Last 3 Encounters:  12/06/18 187 lb (84.8 kg)  08/25/18 186 lb (84.4 kg)  02/17/18 186 lb 6.4 oz (84.6 kg)     Patient is in no distress; well developed, nourished  and groomed; neck is supple  CARDIOVASCULAR:  Examination of carotid arteries is normal; no carotid bruits  Regular rate and rhythm, no murmurs  Examination of peripheral vascular system by observation and palpation is normal  EYES:  Ophthalmoscopic exam of optic discs and posterior segments is normal; no papilledema or hemorrhages  No exam data present  MUSCULOSKELETAL:  Gait, strength, tone, movements noted in Neurologic exam below  NEUROLOGIC: MENTAL STATUS:  No flowsheet data found.  awake, alert, oriented to person, place and time  recent and remote memory intact  normal attention and concentration  language fluent, comprehension intact, naming intact  fund of knowledge appropriate  CRANIAL NERVE:   2nd - no papilledema on fundoscopic exam  2nd, 3rd, 4th, 6th - pupils equal and reactive to light, visual fields full to confrontation, extraocular muscles intact, no nystagmus  5th - facial sensation symmetric  7th - facial strength symmetric  8th - hearing intact  9th - palate elevates symmetrically, uvula midline  11th - shoulder shrug symmetric  12th - tongue protrusion midline  MOTOR:   RESTING TREMOR IN LEFT UPPER EXT  MILD COGWHEELING IN LEFT UPPER EXT  NO BRADYKINESIA  normal bulk and tone, full strength in the BUE, BLE  SENSORY:   normal and symmetric to light touch, temperature, vibration  COORDINATION:   finger-nose-finger, fine finger movements normal  REFLEXES:   deep tendon reflexes TRACE and symmetric  GAIT/STATION:   narrow based gait; LEFT HAND TREMOR WITH WALKING    DIAGNOSTIC DATA (LABS, IMAGING, TESTING) - I reviewed patient records, labs, notes, testing and imaging myself where available.  Lab Results  Component Value Date   WBC 6.0 11/30/2017   HGB 14.3 11/30/2017   HCT 44.3 11/30/2017   MCV 89 11/30/2017   PLT 285 11/30/2017      Component Value Date/Time   NA 141 11/30/2017 1419   K 4.3 11/30/2017  1419   CL 105 11/30/2017 1419   CO2 23 11/30/2017 1419   GLUCOSE 95 11/30/2017 1419   BUN 14 11/30/2017 1419   CREATININE 0.96 11/30/2017 1419   CALCIUM 9.5 11/30/2017 1419   PROT 6.9 08/13/2016 1238   ALBUMIN 4.5 08/13/2016 1238   AST 39 08/13/2016 1238   ALT 27 08/13/2016 1238   ALKPHOS 73 08/13/2016 1238   BILITOT 0.5 08/13/2016 1238   GFRNONAA 80 11/30/2017 1419   GFRAA 93 11/30/2017 1419   Lab Results  Component Value Date  CHOL 219 (H) 08/13/2016   HDL 62 08/13/2016   LDLCALC 134 (H) 08/13/2016   TRIG 113 08/13/2016   CHOLHDL 3.5 08/13/2016   No results found for: HGBA1C Lab Results  Component Value Date   [id number] 150 11/30/2017   Lab Results  Component Value Date   TSH 2.950 11/30/2017      ASSESSMENT AND PLAN  68 y.o. year old male here with new onset resting tremor in left upper extremity with mild cogwheel rigidity.  Remainder of exam unremarkable.  Could represent idiopathic Parkinson's disease or other secondary cause.  Will proceed with further work-up and empiric trial of medication.  Ddx: parkinsonism vs other secondary cause (stroke, autoimmune)  1. Resting tremor     PLAN:  - check MRI brain  - empiric trial carb/levo; start carb/levo half tab three times a day with meals; after 1-2 weeks increase to 1 tab three times a day with meals  Orders Placed This Encounter  Procedures  . MR BRAIN W WO CONTRAST   Meds ordered this encounter  Medications  . carbidopa-levodopa (SINEMET IR) 25-100 MG tablet    Sig: Take 0.5-1 tablets by mouth 3 (three) times daily before meals.    Dispense:  90 tablet    Refill:  6   Return in about 4 months (around 04/07/2019).    Penni Bombard, MD 09/06/9792, 8:01 AM Certified in Neurology, Neurophysiology and Neuroimaging  [facility] [address] [place], [place] [address] [phone]

## 2018-12-06 NOTE — Telephone Encounter (Signed)
[employer] medicare order sent to [facility]. They will obtain the auth and reach out to the patient to schedule.  °

## 2019-01-03 ENCOUNTER — Ambulatory Visit
Admission: RE | Admit: 2019-01-03 | Discharge: 2019-01-03 | Disposition: A | Discharge status: Home / Self Care | Payer: Medicare HMO | Source: Ambulatory Visit | Attending: Diagnostic Neuroimaging | Admitting: Diagnostic Neuroimaging

## 2019-01-03 ENCOUNTER — Other Ambulatory Visit: Payer: Self-pay

## 2019-01-03 DIAGNOSIS — G252 Other specified forms of tremor: Secondary | ICD-10-CM

## 2019-01-03 DIAGNOSIS — R259 Unspecified abnormal involuntary movements: Secondary | ICD-10-CM | POA: Diagnosis not present

## 2019-01-03 MED ORDER — GADOBENATE DIMEGLUMINE 529 MG/ML IV SOLN
17.0000 mL | Freq: Once | INTRAVENOUS | Status: AC | PRN
  Administered 2019-01-03: 17 mL via INTRAVENOUS

## 2019-01-04 DIAGNOSIS — R972 Elevated prostate specific antigen [PSA]: Secondary | ICD-10-CM | POA: Diagnosis not present

## 2019-01-10 ENCOUNTER — Telehealth: Payer: Self-pay | Admitting: *Deleted

## 2019-01-10 NOTE — Telephone Encounter (Signed)
LVM informing patient his MRI brain results are unremarkable imaging results. Advised he continue taking carb/levo medication, return for FU 04/11/19 and call for any questions.

## 2019-01-11 DIAGNOSIS — R972 Elevated prostate specific antigen [PSA]: Secondary | ICD-10-CM | POA: Diagnosis not present

## 2019-01-11 DIAGNOSIS — N3943 Post-void dribbling: Secondary | ICD-10-CM | POA: Diagnosis not present

## 2019-01-11 DIAGNOSIS — N401 Enlarged prostate with lower urinary tract symptoms: Secondary | ICD-10-CM | POA: Diagnosis not present

## 2019-01-30 NOTE — Progress Notes (Deleted)
PATIENT: Alan Ball DOB: Sep 09, 1950  REASON FOR VISIT: follow up HISTORY FROM: patient  HISTORY OF PRESENT ILLNESS: Today 01/30/19  HISTORY   REVIEW OF SYSTEMS: Out of a complete 14 system review of symptoms, the patient complains only of the following symptoms, and all other reviewed systems are negative.  ALLERGIES: No Known Allergies  HOME MEDICATIONS: Outpatient Medications Prior to Visit  Medication Sig Dispense Refill  . albuterol (PROVENTIL HFA;VENTOLIN HFA) 108 (90 Base) MCG/ACT inhaler Inhale 1-2 puffs into the lungs every 4 (four) hours as needed for wheezing or shortness of breath. 1 Inhaler 0  . carbidopa-levodopa (SINEMET IR) 25-100 MG tablet Take 0.5-1 tablets by mouth 3 (three) times daily before meals. 90 tablet 6  . ibuprofen (ADVIL,MOTRIN) 200 MG tablet Take 200 mg by mouth every 6 (six) hours as needed for pain.    . Multiple Vitamin (MULTIVITAMIN) tablet Take 1 tablet by mouth.    . vitamin C (ASCORBIC ACID) 500 MG tablet Take 500 mg by mouth daily.     No facility-administered medications prior to visit.     PAST MEDICAL HISTORY: Past Medical History:  Diagnosis Date  . Allergy   . Asthma   . Tremor     PAST SURGICAL HISTORY: Past Surgical History:  Procedure Laterality Date  . CARPAL TUNNEL RELEASE Bilateral 2019, 2020  . HERNIA REPAIR Bilateral    inguinal  . SHOULDER SURGERY      FAMILY HISTORY: Family History  Problem Relation Age of Onset  . Cancer Father   . Diabetes Father     SOCIAL HISTORY: Social History   Socioeconomic History  . Marital status: Married    Spouse name: Romie Minus  . Number of children: Not on file  . Years of education: Not on file  . Highest education level: Bachelor's degree (e.g., BA, AB, BS)  Occupational History    Comment: retired  Scientific laboratory technician  . Financial resource strain: Not on file  . Food insecurity    Worry: Not on file    Inability: Not on file  . Transportation needs    Medical: Not  on file    Non-medical: Not on file  Tobacco Use  . Smoking status: Never Smoker  . Smokeless tobacco: Never Used  Substance and Sexual Activity  . Alcohol use: No  . Drug use: No  . Sexual activity: Never  Lifestyle  . Physical activity    Days per week: Not on file    Minutes per session: Not on file  . Stress: Not on file  Relationships  . Social Herbalist on phone: Not on file    Gets together: Not on file    Attends religious service: Not on file    Active member of club or organization: Not on file    Attends meetings of clubs or organizations: Not on file    Relationship status: Not on file  . Intimate partner violence    Fear of current or ex partner: Not on file    Emotionally abused: Not on file    Physically abused: Not on file    Forced sexual activity: Not on file  Other Topics Concern  . Not on file  Social History Narrative   Lives with wife   Caffeine- coffee 3 cups daily, maybe a soda      PHYSICAL EXAM  There were no vitals filed for this visit. There is no height or weight on file to  calculate BMI.  Generalized: Well developed, in no acute distress   Neurological examination  Mentation: Alert oriented to time, place, history taking. Follows all commands speech and language fluent Cranial nerve II-XII: Pupils were equal round reactive to light. Extraocular movements were full, visual field were full on confrontational test. Facial sensation and strength were normal. Uvula tongue midline. Head turning and shoulder shrug  were normal and symmetric. Motor: The motor testing reveals 5 over 5 strength of all 4 extremities. Good symmetric motor tone is noted throughout.  Sensory: Sensory testing is intact to soft touch on all 4 extremities. No evidence of extinction is noted.  Coordination: Cerebellar testing reveals good finger-nose-finger and heel-to-shin bilaterally.  Gait and station: Gait is normal. Tandem gait is normal. Romberg is  negative. No drift is seen.  Reflexes: Deep tendon reflexes are symmetric and normal bilaterally.   DIAGNOSTIC DATA (LABS, IMAGING, TESTING) - I reviewed patient records, labs, notes, testing and imaging myself where available.  Lab Results  Component Value Date   WBC 6.0 11/30/2017   HGB 14.3 11/30/2017   HCT 44.3 11/30/2017   MCV 89 11/30/2017   PLT 285 11/30/2017      Component Value Date/Time   NA 141 11/30/2017 1419   K 4.3 11/30/2017 1419   CL 105 11/30/2017 1419   CO2 23 11/30/2017 1419   GLUCOSE 95 11/30/2017 1419   BUN 14 11/30/2017 1419   CREATININE 0.96 11/30/2017 1419   CALCIUM 9.5 11/30/2017 1419   PROT 6.9 08/13/2016 1238   ALBUMIN 4.5 08/13/2016 1238   AST 39 08/13/2016 1238   ALT 27 08/13/2016 1238   ALKPHOS 73 08/13/2016 1238   BILITOT 0.5 08/13/2016 1238   GFRNONAA 80 11/30/2017 1419   GFRAA 93 11/30/2017 1419   Lab Results  Component Value Date   CHOL 219 (H) 08/13/2016   HDL 62 08/13/2016   LDLCALC 134 (H) 08/13/2016   TRIG 113 08/13/2016   CHOLHDL 3.5 08/13/2016   No results found for: HGBA1C Lab Results  Component Value Date   [id number] 11/30/2017   Lab Results  Component Value Date   TSH 2.950 11/30/2017      ASSESSMENT AND PLAN 68 y.o. year old male  has a past medical history of Allergy, Asthma, and Tremor. here with ***   I spent 15 minutes with the patient. 50% of this time was spent   Butler Denmark, [place], DNP 01/30/2019, 9:50 AM [facility] [address] [place], [place] [address] [phone]

## 2019-01-31 ENCOUNTER — Ambulatory Visit: Payer: Medicare HMO | Admitting: Neurology

## 2019-01-31 ENCOUNTER — Ambulatory Visit: Payer: Medicare HMO | Admitting: Family Medicine

## 2019-01-31 ENCOUNTER — Encounter: Payer: Self-pay | Admitting: Family Medicine

## 2019-01-31 ENCOUNTER — Other Ambulatory Visit: Payer: Self-pay

## 2019-01-31 VITALS — BP 145/84 | HR 67 | Temp 98.0°F | Ht 69.0 in | Wt 187.4 lb

## 2019-01-31 DIAGNOSIS — R29898 Other symptoms and signs involving the musculoskeletal system: Secondary | ICD-10-CM

## 2019-01-31 DIAGNOSIS — R2689 Other abnormalities of gait and mobility: Secondary | ICD-10-CM | POA: Diagnosis not present

## 2019-01-31 DIAGNOSIS — R259 Unspecified abnormal involuntary movements: Secondary | ICD-10-CM | POA: Diagnosis not present

## 2019-01-31 DIAGNOSIS — G252 Other specified forms of tremor: Secondary | ICD-10-CM

## 2019-01-31 MED ORDER — CARBIDOPA-LEVODOPA 25-100 MG PO TABS: 1.0000 | ORAL_TABLET | Freq: Four times a day (QID) | ORAL | 5 refills | Status: DC

## 2019-01-31 NOTE — Patient Instructions (Addendum)
We will increase carbadopa/levadopa to 1 tablet 4 times daily  Take medication 30 minutes prior to meals, monitor symptoms for improvement 1-2 hours following medication  We will order DAT scan today  Work closely with PCP for BP, cholesterol and weight management  Follow up in 6 weeks, sooner if needed  Carbidopa; Levodopa tablets What is this medicine? CARBIDOPA;LEVODOPA (kar bi DOE pa; lee voe DOE pa) is used to treat the symptoms of Parkinson's disease. This medicine may be used for other purposes; ask your health care provider or pharmacist if you have questions. COMMON BRAND NAME(S): Atamet, SINEMET What should I tell my health care provider before I take this medicine? They need to know if you have any of these conditions:  asthma or lung disease  depression or other mental illness  diabetes  glaucoma  heart disease, including history of a heart attack  irregular heart beat  kidney or liver disease  melanoma or suspicious skin lesions  stomach or intestine ulcers  an unusual or allergic reaction to levodopa, carbidopa, other medicines, foods, dyes, or preservatives  pregnant or trying to get pregnant  breast-feeding How should I use this medicine? Take this medicine by mouth with a glass of water. Follow the directions on the prescription label. Take your doses at regular intervals. Do not take your medicine more often than directed. Do not stop taking except on the advice of your doctor or health care professional. Talk to your pediatrician regarding the use of this medicine in children. Special care may be needed. Overdosage: If you think you have taken too much of this medicine contact a poison control center or emergency room at once. NOTE: This medicine is only for you. Do not share this medicine with others. What if I miss a dose? If you miss a dose, take it as soon as you can. If it is almost time for your next dose, take only that dose. Do not take  double or extra doses. What may interact with this medicine? Do not take this medicine with any of the following medications:  MAOIs like Marplan, Nardil, and Parnate  reserpine  tetrabenazine This medicine may also interact with the following medications:  alcohol  droperidol  entacapone  iron supplements or multivitamins with iron  isoniazid, INH  linezolid  medicines for depression, anxiety, or psychotic disturbances  medicines for high blood pressure  medicines for sleep  metoclopramide  papaverine  procarbazine  tedizolid  rasagiline  selegiline  tolcapone This list may not describe all possible interactions. Give your health care provider a list of all the medicines, herbs, non-prescription drugs, or dietary supplements you use. Also tell them if you smoke, drink alcohol, or use illegal drugs. Some items may interact with your medicine. What should I watch for while using this medicine? Visit your doctor or health care professional for regular checks on your progress. It may be several weeks or months before you feel the full benefits of this medicine. Continue to take your medicine on a regular schedule. Do not take any additional medicines for Parkinson's disease without first consulting with your health care provider. You may experience a wearing of effect prior to the time for your next dose of this medicine. You may also experience an on-off effect where the medicine apparently stops working for anything from a minute to several hours, then suddenly starts working again. Tell your doctor or health care professional if any of these symptoms happen to you. Your dose may need  to be changed. A high protein diet can slow or prevent absorption of this medicine. Avoid high protein foods near the time of taking this medicine to help to prevent these problems. Take this medicine at least 30 minutes before eating or one hour after meals. You may want to eat higher  protein foods later in the day or in small amounts. Discuss your diet with your doctor or health care professional or nutritionist. You may get drowsy or dizzy. Do not drive, use machinery, or do anything that needs mental alertness until you know how this drug affects you. Do not stand or sit up quickly, especially if you are an older patient. This reduces the risk of dizzy or fainting spells. Alcohol can make you more drowsy and dizzy. Avoid alcoholic drinks. If you find that you have sudden feelings of wanting to sleep during normal activities, like cooking, watching television, or while driving or riding in a car, you should contact your health care professional. If you are diabetic, this medicine may interfere with the accuracy of some tests for sugar or ketones in the urine (does not interfere with blood tests). Check with your doctor or health care professional before changing the dose of your diabetic medicine. This medicine may discolor the urine or sweat, making it look darker or red in color. This is of no cause for concern. However, this may stain clothing or fabrics. There have been reports of increased sexual urges or other strong urges such as gambling while taking some medicines for Parkinson's disease. If you experience any of these urges while taking this medicine, you should report it to your health care provider as soon as possible. You should check your skin often for changes to moles and new growths while taking this medicine. Call your doctor if you notice any of these changes. This medicine may cause a decrease in vitamin B6. You should make sure that you get enough vitamin B6 while you are taking this medicine. Discuss the foods you eat and the vitamins you take with your health care professional. What side effects may I notice from receiving this medicine? Side effects that you should report to your doctor or health care professional as soon as possible:  allergic reactions like  skin rash, itching or hives, swelling of the face, lips, or tongue  anxiety, confusion, or nervousness  falling asleep during normal activities like driving  fast, irregular heartbeat  hallucination, loss of contact with reality  mood changes like aggressive behavior, depression  stomach pain  trouble passing urine  uncontrolled movements of the mouth, head, hands, feet, shoulders, eyelids or other unusual muscle movements Side effects that usually do not require medical attention (report to your doctor or health care professional if they continue or are bothersome):  headache  loss of appetite  muscle twitches  nausea, vomiting  nightmares, trouble sleeping  unusually weak or tired This list may not describe all possible side effects. Call your doctor for medical advice about side effects. You may report side effects to FDA at 1-800-FDA-1088. Where should I keep my medicine? Keep out of the reach of children. Store at room temperature between 15 and 30 degrees C (59 and 86 degrees F). Protect from light. Throw away any unused medicine after the expiration date. NOTE: This sheet is a summary. It may not cover all possible information. If you have questions about this medicine, talk to your doctor, pharmacist, or health care provider.  2020 Elsevier/Gold Standard (2016-11-27 08:21:48)  Parkinson's Disease Parkinson's disease causes problems with movements. It is a long-term condition. It gets worse over time (is progressive). It affects each person in different ways. It makes it harder for you to:  Control how your body moves.  Move your body normally. The condition can range from mild to very bad (advanced). What are the causes? This condition results from a loss of brain cells called neurons. These brain cells make a chemical called dopamine, which is needed to control body movement. As the condition gets worse, the brain cells make less dopamine. This makes it hard  to move or control your movements. The exact cause of this condition is not known. What increases the risk?  Being male.  Being age 65 or older.  Having family members who had Parkinson's disease.  Having had an injury to the brain.  Being very sad (depressed).  Being around things that are harmful or poisonous. What are the signs or symptoms? Symptoms of this condition can vary. The main symptoms have to do with movement. These include:  A tremor or shaking while you are resting that you cannot control.  Stiffness in your neck, arms, and legs.  Slowing of movement. This may include: ? Losing expressions of the face. ? Having trouble making small movements that are needed to button your clothing or brush your teeth.  Walking in a way that is not normal. You may walk with short, shuffling steps.  Loss of balance when standing. You may sway, fall backward, or have trouble making turns. Other symptoms include:  Being very sad, worried, or confused.  Seeing or hearing things that are not real.  Losing thinking abilities (dementia).  Trouble speaking or swallowing.  Having a hard time pooping (constipation).  Needing to pee right away, peeing often, or not being able to control when you pee or poop.  Sleep problems. How is this treated? There is no cure. The goal of treatment is to manage your symptoms. Treatment may include:  Medicines.  Therapy to help with talking or movement.  Surgery to reduce shaking and other movements that you cannot control. Follow these instructions at home: Medicines  Take over-the-counter and prescription medicines only as told by your doctor.  Avoid taking pain or sleeping medicines. Eating and drinking  Follow instructions from your doctor about what you cannot eat or drink.  Do not drink alcohol. Activity  Talk with your doctor about if it is safe for you to drive.  Do exercises as told by your doctor. Lifestyle       Put in grab bars and railings in your home. These help to prevent falls.  Do not use any products that contain nicotine or tobacco, such as cigarettes, e-cigarettes, and chewing tobacco. If you need help quitting, ask your doctor.  Join a support group. General instructions  Talk with your doctor about what you need help with and what your safety needs are.  Keep all follow-up visits as told by your doctor, including any therapy visits to help with talking or moving. This is important. Contact a doctor if:  Medicines do not help your symptoms.  You feel off-balance.  You fall at home.  You need more help at home.  You have trouble swallowing.  You have a very hard time pooping.  You have a lot of side effects from your medicines.  You feel very sad, worried, or confused. Get help right away if:  You were hurt in a fall.  You  see or hear things that are not real.  You cannot swallow without choking.  You have chest pain or trouble breathing.  You do not feel safe at home.  You have thoughts about hurting yourself or others. If you ever feel like you may hurt yourself or others, or have thoughts about taking your own life, get help right away. You can go to your nearest emergency department or call:  Your local emergency services (911 in the [place]  A suicide crisis helpline, such as the [facility] at 864-321-5133. This is open 24 hours a day. Summary  This condition causes problems with movements.  It is a long-term condition. It gets worse over time.  There is no cure. Treatment focuses on managing your symptoms.  Talk with your doctor about what you need help with and what your safety needs are.  Keep all follow-up visits as told by your doctor. This is important. This information is not intended to replace advice given to you by your health care provider. Make sure you discuss any questions you have with your health care  provider. Document Released: 07/13/2011 Document Revised: 07/07/2018 Document Reviewed: 07/07/2018 Elsevier Patient Education  2020 [employer].   Stroke Prevention Some medical conditions and lifestyle choices can lead to a higher risk for a stroke. You can help to prevent a stroke by making nutrition, lifestyle, and other changes. What nutrition changes can be made?   Eat healthy foods. ? Choose foods that are high in fiber. These include:  Fresh fruits.  Fresh vegetables.  Whole grains. ? Eat at least 5 or more servings of fruits and vegetables each day. Try to fill half of your plate at each meal with fruits and vegetables. ? Choose lean protein foods. These include:  Lowfat (lean) cuts of meat.  Chicken without skin.  Fish.  Tofu.  Beans.  Nuts. ? Eat low-fat dairy products. ? Avoid foods that:  Are high in salt (sodium).  Have saturated fat.  Have trans fat.  Have cholesterol.  Are processed.  Are premade.  Follow eating guidelines as told by your doctor. These may include: ? Reducing how many calories you eat and drink each day. ? Limiting how much salt you eat or drink each day to 1,500 milligrams (mg). ? Using only healthy fats for cooking. These include:  Olive oil.  Canola oil.  Sunflower oil. ? Counting how many carbohydrates you eat and drink each day. What lifestyle changes can be made?  Try to stay at a healthy weight. Talk to your doctor about what a good weight is for you.  Get at least 30 minutes of moderate physical activity at least 5 days a week. This can include: ? Fast walking. ? Biking. ? Swimming.  Do not use any products that have nicotine or tobacco. This includes cigarettes and e-cigarettes. If you need help quitting, ask your doctor. Avoid being around tobacco smoke in general.  Limit how much alcohol you drink to no more than 1 drink a day for nonpregnant women and 2 drinks a day for men. One drink equals 12 oz of  beer, 5 oz of wine, or 1 oz of hard liquor.  Do not use drugs.  Avoid taking birth control pills. Talk to your doctor about the risks of taking birth control pills if: ? You are over 63 years old. ? You smoke. ? You get migraines. ? You have had a blood clot. What other changes can be made?  Manage your cholesterol. ? It is important to eat a healthy diet. ? If your cholesterol cannot be managed through your diet, you may also need to take medicines. Take medicines as told by your doctor.  Manage your diabetes. ? It is important to eat a healthy diet and to exercise regularly. ? If your blood sugar cannot be managed through diet and exercise, you may need to take medicines. Take medicines as told by your doctor.  Control your high blood pressure (hypertension). ? Try to keep your blood pressure below 130/80. This can help lower your risk of stroke. ? It is important to eat a healthy diet and to exercise regularly. ? If your blood pressure cannot be managed through diet and exercise, you may need to take medicines. Take medicines as told by your doctor. ? Ask your doctor if you should check your blood pressure at home. ? Have your blood pressure checked every year. Do this even if your blood pressure is normal.  Talk to your doctor about getting checked for a sleep disorder. Signs of this can include: ? Snoring a lot. ? Feeling very tired.  Take over-the-counter and prescription medicines only as told by your doctor. These may include aspirin or blood thinners (antiplatelets or anticoagulants).  Make sure that any other medical conditions you have are managed. Where to find more information  American Stroke Association: www.strokeassociation.org  National Stroke Association: www.stroke.org Get help right away if:  You have any symptoms of stroke. "BE FAST" is an easy way to remember the main warning signs: ? B - Balance. Signs are dizziness, sudden trouble walking, or loss of  balance. ? E - Eyes. Signs are trouble seeing or a sudden change in how you see. ? F - Face. Signs are sudden weakness or loss of feeling of the face, or the face or eyelid drooping on one side. ? A - Arms. Signs are weakness or loss of feeling in an arm. This happens suddenly and usually on one side of the body. ? S - Speech. Signs are sudden trouble speaking, slurred speech, or trouble understanding what people say. ? T - Time. Time to call emergency services. Write down what time symptoms started.  You have other signs of stroke, such as: ? A sudden, very bad headache with no known cause. ? Feeling sick to your stomach (nausea). ? Throwing up (vomiting). ? Jerky movements you cannot control (seizure). These symptoms may represent a serious problem that is an emergency. Do not wait to see if the symptoms will go away. Get medical help right away. Call your local emergency services (911 in the [place] Do not drive yourself to the hospital. Summary  You can prevent a stroke by eating healthy, exercising, not smoking, drinking less alcohol, and treating other health problems, such as diabetes, high blood pressure, or high cholesterol.  Do not use any products that contain nicotine or tobacco, such as cigarettes and e-cigarettes.  Get help right away if you have any signs or symptoms of a stroke. This information is not intended to replace advice given to you by your health care provider. Make sure you discuss any questions you have with your health care provider. Document Released: 10/20/2011 Document Revised: 06/16/2018 Document Reviewed: 07/22/2016 Elsevier Patient Education  2020 [employer].

## 2019-01-31 NOTE — Progress Notes (Signed)
PATIENT: Alan Ball DOB: June 22, 1950  REASON FOR VISIT: follow up HISTORY FROM: patient  Chief Complaint  Patient presents with  . Follow-up    Tremor f/u. Alone. Rm 1. Patient mentioned that his tremors havent changed even with the help of medication.      HISTORY OF PRESENT ILLNESS: Today 01/31/19 Alan Ball is a 68 y.o. male here today for follow up for tremor. He continues to have tremor of left hand at rest. Seems to improve with activity. Has not noticed tremor elsewhere. Occasionally he has felt as if he is leaning forward and a bit unbalanced at times. He has not noticed a shuffling gait or short steps. No falls. No concerns with memory or facial changes. No concerns of depression or anxiety. No changes in voice or trouble swallowing. 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  He started carb/levo 1/2 tablet three times daily then increased to 1 tablet three times daily. He does not feel this has helped any at all. He admits that he has taken it with food mostly and has not paid attention to tremor much. His daughter reports no significant change. He has tolerated medication well with no obvious adverse effects. 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 MRI 01/05/2019 unremarkable with exception of mild chronic small vessel ischemia and chronic right maxillary sinusitis. He reports BP is usually elevated in the mornings. Usually 140's/80's in the morning and 135/80's later in the day. No headaches, dizziness or chest pain. Last lipid panel in 2018 showed HLD with LDL 134. He is not on statin therapy or aspirin. No stroke like symptoms.    HISTORY: (copied from Dr Gladstone Lighter note on 12/06/2018)  68 year old male here for evaluation of left hand tremor.  Symptoms started 4 months ago, gradual onset and progressive.  He notes resting tremor mainly in his left hand and arm.  This affects him randomly  throughout the day.  Symptoms worsening over time.  He notes this when he is walking.  Sometimes his left wrist flexes.  No problem with right hand.  No problem with legs.  No balance or walking issues.  No change in smell or taste.  No vivid dreams or acting out during sleep.  No family history of tremor.   REVIEW OF SYSTEMS: Out of a complete 14 system review of symptoms, the patient complains only of the following symptoms, tremor, weakness and all other reviewed systems are negative.  ALLERGIES: No Known Allergies  HOME MEDICATIONS: Outpatient Medications Prior to Visit  Medication Sig Dispense Refill  . ibuprofen (ADVIL,MOTRIN) 200 MG tablet Take 200 mg by mouth every 6 (six) hours as needed for pain.    . Multiple Vitamin (MULTIVITAMIN) tablet Take 1 tablet by mouth.    . vitamin C (ASCORBIC ACID) 500 MG tablet Take 500 mg by mouth daily.    . carbidopa-levodopa (SINEMET IR) 25-100 MG tablet Take 0.5-1 tablets by mouth 3 (three) times daily before meals. 90 tablet 6  . albuterol (PROVENTIL HFA;VENTOLIN HFA) 108 (90 Base) MCG/ACT inhaler Inhale 1-2 puffs into the lungs every 4 (four) hours as needed for wheezing or shortness of breath. (Patient not taking: Reported on 01/31/2019) 1 Inhaler 0   No facility-administered medications prior to visit.     PAST MEDICAL HISTORY: Past Medical History:  Diagnosis Date  . Allergy   . Asthma   .  Tremor     PAST SURGICAL HISTORY: Past Surgical History:  Procedure Laterality Date  . CARPAL TUNNEL RELEASE Bilateral 2019, 2020  . HERNIA REPAIR Bilateral    inguinal  . SHOULDER SURGERY      FAMILY HISTORY: Family History  Problem Relation Age of Onset  . Cancer Father   . Diabetes Father     SOCIAL HISTORY: Social History   Socioeconomic History  . Marital status: Married    Spouse name: Romie Minus  . Number of children: Not on file  . Years of education: Not on file  . Highest education level: Bachelor's degree (e.g., BA, AB,  BS)  Occupational History    Comment: retired  Scientific laboratory technician  . Financial resource strain: Not on file  . Food insecurity    Worry: Not on file    Inability: Not on file  . Transportation needs    Medical: Not on file    Non-medical: Not on file  Tobacco Use  . Smoking status: Never Smoker  . Smokeless tobacco: Never Used  Substance and Sexual Activity  . Alcohol use: No  . Drug use: No  . Sexual activity: Never  Lifestyle  . Physical activity    Days per week: Not on file    Minutes per session: Not on file  . Stress: Not on file  Relationships  . Social Herbalist on phone: Not on file    Gets together: Not on file    Attends religious service: Not on file    Active member of club or organization: Not on file    Attends meetings of clubs or organizations: Not on file    Relationship status: Not on file  . Intimate partner violence    Fear of current or ex partner: Not on file    Emotionally abused: Not on file    Physically abused: Not on file    Forced sexual activity: Not on file  Other Topics Concern  . Not on file  Social History Narrative   Lives with wife   Caffeine- coffee 3 cups daily, maybe a soda    PHYSICAL EXAM  Vitals:   01/31/19 0841  BP: (!) 145/84  Pulse: 67  Temp: 98 F (36.7 C)  TempSrc: Oral  Weight: 187 lb 6.4 oz (85 kg)  Height: 5\' 9"  (1.753 m)   Body mass index is 27.67 kg/m.  Generalized: Well developed, in no acute distress, normal expression  Neurological examination  Mentation: Alert oriented to time, place, history taking. Follows all commands speech and language fluent Cranial nerve II-XII: Pupils were equal round reactive to light. Extraocular movements were full, visual field were full on confrontational test. Facial sensation and strength were normal. Uvula tongue midline. Head turning and shoulder shrug  were normal and symmetric. Motor: The motor testing reveals 5 over 5 strength of all 4 extremities. Good  symmetric motor tone is noted throughout. No bradykinesia noted, mild cogwheel rigidity of left arm Sensory: Sensory testing is intact to soft touch on all 4 extremities. No evidence of extinction is noted.  Coordination: Cerebellar testing reveals good finger-nose-finger and heel-to-shin bilaterally.  Gait and station: Gait is normal. Tandem gait is normal. Romberg is negative. No drift is seen.  Reflexes: Deep tendon reflexes are symmetric and normal bilaterally.   DIAGNOSTIC DATA (LABS, IMAGING, TESTING) - I reviewed patient records, labs, notes, testing and imaging myself where available.  No flowsheet data found.   Lab Results  Component  Value Date   WBC 6.0 11/30/2017   HGB 14.3 11/30/2017   HCT 44.3 11/30/2017   MCV 89 11/30/2017   PLT 285 11/30/2017      Component Value Date/Time   NA 141 11/30/2017 1419   K 4.3 11/30/2017 1419   CL 105 11/30/2017 1419   CO2 23 11/30/2017 1419   GLUCOSE 95 11/30/2017 1419   BUN 14 11/30/2017 1419   CREATININE 0.96 11/30/2017 1419   CALCIUM 9.5 11/30/2017 1419   PROT 6.9 08/13/2016 1238   ALBUMIN 4.5 08/13/2016 1238   AST 39 08/13/2016 1238   ALT 27 08/13/2016 1238   ALKPHOS 73 08/13/2016 1238   BILITOT 0.5 08/13/2016 1238   GFRNONAA 80 11/30/2017 1419   GFRAA 93 11/30/2017 1419   Lab Results  Component Value Date   CHOL 219 (H) 08/13/2016   HDL 62 08/13/2016   LDLCALC 134 (H) 08/13/2016   TRIG 113 08/13/2016   CHOLHDL 3.5 08/13/2016   No results found for: HGBA1C Lab Results  Component Value Date   [id number] 11/30/2017   Lab Results  Component Value Date   TSH 2.950 11/30/2017    ASSESSMENT AND PLAN 68 y.o. year old male  has a past medical history of Allergy, Asthma, and Tremor. here with     ICD-10-CM   1. Resting tremor  R25.9 carbidopa-levodopa (SINEMET IR) 25-100 MG tablet    NM BRAIN DATSCAN TUMOR LOC INFLAM SPECT 1 DAY  2. Imbalance  R26.89 carbidopa-levodopa (SINEMET IR) 25-100 MG tablet    NM BRAIN  DATSCAN TUMOR LOC INFLAM SPECT 1 DAY  3. Cogwheel rigidity  R29.898 carbidopa-levodopa (SINEMET IR) 25-100 MG tablet    NM BRAIN DATSCAN TUMOR LOC INFLAM SPECT 1 DAY    I had a long discussion with Mr. Harden and his daughter regarding tremor.  Etiology is unclear at this point.  There is no family history for Parkinson's Disease.  We have discussed most common presenting findings with Parkinson's Disease.  He and his daughter would like to pursue additional testing.  We have discussed DAT scan in detail.  They were educated DaTscan may be helpful in diagnosing but may not be conclusive.  We have discussed possible medications for treatment of tremor.  He and his daughter agree that they would prefer to stay on Sinemet for now.  I will increase dose to 1 tablet 4 times daily.  I have advised that he take this 30 minutes prior to a meal.  I have asked he and his daughter to pay close attention to tremor about an hour after taking medication to note any changes.  Additional information regarding Sinemet and Parkinson's disease provided in AVS.  MRI results discussed in detail.  He was educated on need for blood pressure and cholesterol management.  Blood pressure reading is slightly elevated today in the office.  LDL was 134 in 2018.  He was advised to follow-up very closely with primary care for management.  We have reviewed stroke precautions.  He will follow-up with me in 6 weeks, sooner if needed.  He and his daughter both verbalized understanding and agreement with this plan.   Orders Placed This Encounter  Procedures  . NM BRAIN DATSCAN TUMOR LOC INFLAM SPECT 1 DAY    Standing Status:   Future    Standing Expiration Date:   04/01/2020    Order Specific Question:   If indicated for the ordered procedure, I authorize the administration of a radiopharmaceutical per Radiology  protocol    Answer:   Yes    Order Specific Question:   Preferred imaging location?    Answer:   [facility]    Order  Specific Question:   Radiology Contrast Protocol - do NOT remove file path    Answer:   [email or web address]     Meds ordered this encounter  Medications  . carbidopa-levodopa (SINEMET IR) 25-100 MG tablet    Sig: Take 1 tablet by mouth 4 (four) times daily.    Dispense:  120 tablet    Refill:  5    Order Specific Question:   Supervising Provider    Answer:   Melvenia Beam [id number]      I spent 60 minutes with the patient. 50% of this time was spent counseling and educating patient on plan of care and medications.    Debbora Presto, FNP-C 01/31/2019, 10:28 AM [facility] [address], [place], [place] [address] [phone]

## 2019-02-02 DIAGNOSIS — M5030 Other cervical disc degeneration, unspecified cervical region: Secondary | ICD-10-CM | POA: Diagnosis not present

## 2019-02-02 DIAGNOSIS — M542 Cervicalgia: Secondary | ICD-10-CM | POA: Diagnosis not present

## 2019-02-06 DIAGNOSIS — M542 Cervicalgia: Secondary | ICD-10-CM | POA: Diagnosis not present

## 2019-02-11 DIAGNOSIS — G5602 Carpal tunnel syndrome, left upper limb: Secondary | ICD-10-CM | POA: Diagnosis not present

## 2019-02-13 ENCOUNTER — Other Ambulatory Visit: Payer: Self-pay

## 2019-02-13 ENCOUNTER — Ambulatory Visit (INDEPENDENT_AMBULATORY_CARE_PROVIDER_SITE_OTHER): Payer: Medicare HMO | Admitting: Family Medicine

## 2019-02-13 ENCOUNTER — Encounter: Payer: Self-pay | Admitting: Family Medicine

## 2019-02-13 VITALS — BP 142/82 | HR 74 | Temp 97.9°F | Ht 67.0 in | Wt 185.5 lb

## 2019-02-13 DIAGNOSIS — Z1211 Encounter for screening for malignant neoplasm of colon: Secondary | ICD-10-CM | POA: Diagnosis not present

## 2019-02-13 DIAGNOSIS — J9801 Acute bronchospasm: Secondary | ICD-10-CM

## 2019-02-13 DIAGNOSIS — K219 Gastro-esophageal reflux disease without esophagitis: Secondary | ICD-10-CM | POA: Diagnosis not present

## 2019-02-13 DIAGNOSIS — I10 Essential (primary) hypertension: Secondary | ICD-10-CM

## 2019-02-13 DIAGNOSIS — Z0000 Encounter for general adult medical examination without abnormal findings: Secondary | ICD-10-CM

## 2019-02-13 DIAGNOSIS — Z1329 Encounter for screening for other suspected endocrine disorder: Secondary | ICD-10-CM

## 2019-02-13 DIAGNOSIS — Z130 Encounter for screening for diseases of the blood and blood-forming organs and certain disorders involving the immune mechanism: Secondary | ICD-10-CM | POA: Diagnosis not present

## 2019-02-13 DIAGNOSIS — Z23 Encounter for immunization: Secondary | ICD-10-CM

## 2019-02-13 DIAGNOSIS — E785 Hyperlipidemia, unspecified: Secondary | ICD-10-CM

## 2019-02-13 DIAGNOSIS — Z0001 Encounter for general adult medical examination with abnormal findings: Secondary | ICD-10-CM

## 2019-02-13 DIAGNOSIS — R251 Tremor, unspecified: Secondary | ICD-10-CM

## 2019-02-13 MED ORDER — SHINGRIX 50 MCG/0.5ML IM SUSR: 0.5000 mL | Freq: Once | INTRAMUSCULAR | 1 refills | Status: AC

## 2019-02-13 MED ORDER — ALBUTEROL SULFATE HFA 108 (90 BASE) MCG/ACT IN AERS: 1.0000 | INHALATION_SPRAY | RESPIRATORY_TRACT | 0 refills | Status: DC | PRN

## 2019-02-13 MED ORDER — OMEPRAZOLE 20 MG PO CPDR: 20.0000 mg | DELAYED_RELEASE_CAPSULE | Freq: Every day | ORAL | 1 refills | Status: DC

## 2019-02-13 MED ORDER — AMLODIPINE BESYLATE 2.5 MG PO TABS: 2.5000 mg | ORAL_TABLET | Freq: Every day | ORAL | 1 refills | Status: DC

## 2019-02-13 MED ORDER — ATORVASTATIN CALCIUM 10 MG PO TABS: 10.0000 mg | ORAL_TABLET | Freq: Every day | ORAL | 0 refills | Status: DC

## 2019-02-13 NOTE — Progress Notes (Signed)
Subjective:    Patient ID: Alan Ball, male    DOB: May 07, 1950, 68 y.o.   MRN: [id number]  HPI Alan Ball is a 68 y.o. male Presents today for: Chief Complaint  Patient presents with  . Annual Exam    CPE   Status post Medicare annual wellness exam in April.  Last visit with me in October 2019. Here for multiple concerns below.   Care team: Ortho: Dr. Rolena Infante, Dr. Nelva Bush for physical medicine and rehab, Dr. Burney Gauze for hand neurology: Dr. Leta Baptist.  Hx CTS with procedures, followed by above. Surgery in L wrist in June. Thought that some of the tremor/shaking was due to surgery - still persistent shaking. EMG and MRI neck by Dr. Rolena Infante- still waiting on results.   Tremor: Evaluated by Dr. Leta Baptist in August.  Resting tremor noted at that time for a few months previously in the left upper extremity.  Mild cogwheel rigidity.  Idiopathic Parkinson's versus secondary cause.  Empiric trial of Sinemet, and MRI obtained.  Seen September 29 by Debbora Presto with [facility]. MRI reportedly unremarkable with exception of mild chronic small vessel ischemia and chronic right maxillary sinusitis.  Still suspicious for Parkinson's disease.  DAT scan planned - waiting on  Sinemet increased to 4 times daily.  Plan for 6-week follow-up. Minimal change with Sinemet - 4 times per day. Min stomach upset.   Elevated blood pressure BP Readings from Last 3 Encounters:  02/13/19 (!) 142/82  01/31/19 (!) 145/84  12/06/18 (!) 155/91   Lab Results  Component Value Date   CREATININE 0.96 11/30/2017  Borderline/mild elevations in the past.  Still elevated recently. Never took meds.   Home readings: 124-149/76-92  Hyperlipidemia:  Lab Results  Component Value Date   CHOL 219 (H) 08/13/2016   HDL 62 08/13/2016   LDLCALC 134 (H) 08/13/2016   TRIG 113 08/13/2016   CHOLHDL 3.5 08/13/2016   Lab Results  Component Value Date   ALT 27 08/13/2016   AST 39 08/13/2016   ALKPHOS 73 08/13/2016   BILITOT 0.5 08/13/2016  Initial plan diet, exercise approach with repeat testing.  Last tested in 2018. The 10-year ASCVD risk score Mikey Bussing DC Brooke Bonito., et al., 2013) is: 20.3%   Values used to calculate the score:     Age: 69 years     Sex: Male     Is Non-Hispanic African American: No     Diabetic: No     Tobacco smoker: No     Systolic Blood Pressure: 017 mmHg     Is BP treated: No     HDL Cholesterol: 62 mg/dL     Total Cholesterol: 219 mg/dL  Indigestion: On and off past 8 months.  Tums, alka seltzer. Notes bending over at times. Some indigestion after meals. Some late night.  No wt loss, night sweats, no hematochezia.  Has cut back from 6-7 cups caffeine to 2 per day. No change in GERD sx's.  Takes zinc otc.  Bloating at times.  No blood in stool or dark stool.   Wt Readings from Last 3 Encounters:  02/13/19 185 lb 8 oz (84.1 kg)  01/31/19 187 lb 6.4 oz (85 kg)  12/06/18 187 lb (84.8 kg)    Rare bronchospasm/allergic? Rare albuteol use. Once last month. .   Cancer screening: Colonoscopy: Due, October 02, 2008 reportedly per patient history. Prostate: PSA 4.2 in 2018.  Recommended follow-up with urology.  Has been followed by urology with most recent visit  September 9, normal PSA 3.2 on September 2.  1 year follow-up planned.  BPH with LUTS.   Immunization History  Administered Date(s) Administered  . Fluad Quad(high Dose 65+) 02/13/2019  . Influenza, High Dose Seasonal PF 02/17/2018  . Influenza,inj,Quad PF,6+ Mos 12/25/2013, 05/11/2016, 04/19/2017  . Pneumococcal Conjugate-13 12/25/2013  . Pneumococcal Polysaccharide-23 03/16/2014  . Tdap 09/08/2012  Shingles: has not had.   Depression screen [facility] 2/9 02/13/2019 08/25/2018 02/17/2018 12/21/2017 11/30/2017  Decreased Interest 0 0 0 0 0  Down, Depressed, Hopeless 0 0 0 0 0  PHQ - 2 Score 0 0 0 0 0     Hearing Screening   '125Hz'$  '250Hz'$  '500Hz'$  '1000Hz'$  '2000Hz'$  '3000Hz'$  '4000Hz'$  '6000Hz'$  '8000Hz'$   Right ear:           Left ear:              Visual Acuity Screening   Right eye Left eye Both eyes  Without correction:     With correction: '20/20 20/20 20/20 '$  appt next few weeks.   Dental: every 6 months.  Exercise: no regular exercise regimen at present.     Patient Active Problem List   Diagnosis Date Noted  . 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 Allergy   . Asthma   . Tremor    Past Surgical History:  Procedure Laterality Date  . CARPAL TUNNEL RELEASE Bilateral 2019, 2020  . HERNIA REPAIR Bilateral    inguinal  . SHOULDER SURGERY     No Known Allergies Prior to Admission medications   Medication Sig Start Date End Date Taking? Authorizing Provider  albuterol (PROVENTIL HFA;VENTOLIN HFA) 108 (90 Base) MCG/ACT inhaler Inhale 1-2 puffs into the lungs every 4 (four) hours as needed for wheezing or shortness of breath. 11/30/17  Yes [name], MD  carbidopa-levodopa (SINEMET IR) 25-100 MG tablet Take 1 tablet by mouth 4 (four) times daily. 01/31/19  Yes Lomax, Amy, NP  ibuprofen (ADVIL,MOTRIN) 200 MG tablet Take 200 mg by mouth every 6 (six) hours as needed for pain.   Yes [provider]  Multiple Vitamin (MULTIVITAMIN) tablet Take 1 tablet by mouth.   Yes [provider]  vitamin C (ASCORBIC ACID) 500 MG tablet Take 500 mg by mouth daily.   Yes [provider]   Social History   Socioeconomic History  . Marital status: Married    Spouse name: Alan Ball  . Number of children: Not on file  . Years of education: Not on file  . Highest education level: Bachelor's degree (e.g., BA, AB, BS)  Occupational History    Comment: retired  Scientific laboratory technician  . Financial resource strain: Not on file  . Food insecurity    Worry: Not on file    Inability: Not on file  . Transportation needs    Medical: Not on file    Non-medical: Not on file  Tobacco Use  . Smoking status: Never Smoker  . Smokeless tobacco: Never Used  Substance and Sexual Activity  .  Alcohol use: No  . Drug use: No  . Sexual activity: Never  Lifestyle  . Physical activity    Days per week: Not on file    Minutes per session: Not on file  . Stress: Not on file  Relationships  . Social Herbalist on phone: Not on file    Gets together: Not on file    Attends religious service: Not on file  Active member of club or organization: Not on file    Attends meetings of clubs or organizations: Not on file    Relationship status: Not on file  . Intimate partner violence    Fear of current or ex partner: Not on file    Emotionally abused: Not on file    Physically abused: Not on file    Forced sexual activity: Not on file  Other Topics Concern  . Not on file  Social History Narrative   Lives with wife   Caffeine- coffee 3 cups daily, maybe a soda    Review of Systems 14 point review of systems positive for appetite change, hearing loss, tinnitus, wheezing, joint pain, back pain, gait problem, muscle aches, neck pain, neck stiffness, tremors, weakness.       Objective:   Physical Exam Vitals signs reviewed.  Constitutional:      General: He is not in acute distress.    Appearance: He is well-developed.  HENT:     Head: Normocephalic and atraumatic.  Eyes:     Pupils: Pupils are equal, round, and reactive to light.  Neck:     Vascular: No carotid bruit or JVD.  Cardiovascular:     Rate and Rhythm: Normal rate and regular rhythm.     Heart sounds: Normal heart sounds. No murmur.  Pulmonary:     Effort: Pulmonary effort is normal.     Breath sounds: Normal breath sounds. No rales.  Skin:    General: Skin is warm and dry.  Neurological:     Mental Status: He is alert and oriented to person, place, and time.     Comments: Intermittent fasciculations of left forearm, tremor of left hand noted at rest.    Vitals:   02/13/19 1327 02/13/19 1334  BP: (!) 167/93 (!) 142/82  Pulse: 74   Temp: 97.9 F (36.6 C)   TempSrc: Oral   Weight: 185 lb 8  oz (84.1 kg)   Height: '5\' 7"'$  (1.702 m)        Assessment & Plan:    Alan Ball is a 68 y.o. male Annual physical exam  - -anticipatory guidance as below in AVS, screening labs above. Health maintenance items as above in HPI discussed/recommended as applicable.   Need for influenza vaccination - Plan: Flu Vaccine QUAD High Dose(Fluad)  Essential hypertension - Plan: amLODipine (NORVASC) 2.5 MG tablet, CBC  -Mildly elevated.  Outside readings also mildly elevated.  Small vessel disease noted on recent neuro imaging.  Decision to start meds.  Potential side effects of amlodipine discussed, start low-dose 2.5 mg with follow-up in the next 6 weeks.  Hyperlipidemia, unspecified hyperlipidemia type - Plan: atorvastatin (LIPITOR) 10 MG tablet, Comprehensive metabolic panel, Lipid panel  -As above, small vessel disease noted on neuroimaging.  Start Lipitor 10 mg daily for elevated cholesterol including elevated ASCVD risk score.  Potential side effects and risk discussed with repeat test in 6 weeks.   Tremor  -Parkinsonism suspected.  Has some other testing pending.  Option of second opinion through other care facility was discussed but agree with current work-up and trial of Sinemet.  Need for shingles vaccine - Plan: Zoster Vaccine Adjuvanted [facility]) injection  Bronchospasm - Plan: albuterol (VENTOLIN HFA) 108 (90 Base) MCG/ACT inhaler  -Intermittent, no symptoms.  Continue albuterol as needed  Gastroesophageal reflux disease, unspecified whether esophagitis present - Plan: Ambulatory referral to Gastroenterology Colon cancer screening - Plan: Ambulatory referral to Gastroenterology  -Trigger foods discussed and  avoidance recommended with handout given.  Start omeprazole once per day.  Referred to gastroenterology to discuss the symptoms as well as reported early satiety and need for repeat colonoscopy.  Screening for thyroid disorder - Plan: TSH  Screening, anemia, deficiency, iron  - Plan: CBC   Meds ordered this encounter  Medications  . amLODipine (NORVASC) 2.5 MG tablet    Sig: Take 1 tablet (2.5 mg total) by mouth daily.    Dispense:  90 tablet    Refill:  1  . atorvastatin (LIPITOR) 10 MG tablet    Sig: Take 1 tablet (10 mg total) by mouth daily.    Dispense:  90 tablet    Refill:  0  . Zoster Vaccine Adjuvanted [facility]) injection    Sig: Inject 0.5 mLs into the muscle once for 1 dose. Repeat in 2-6 months.    Dispense:  0.5 mL    Refill:  1  . albuterol (VENTOLIN HFA) 108 (90 Base) MCG/ACT inhaler    Sig: Inhale 1-2 puffs into the lungs every 4 (four) hours as needed for wheezing or shortness of breath.    Dispense:  18 g    Refill:  0  . omeprazole (PRILOSEC) 20 MG capsule    Sig: Take 1 capsule (20 mg total) by mouth daily.    Dispense:  30 capsule    Refill:  1   Patient Instructions    I refer you to gastroenterology for colonoscopy and to discuss heartburn.  Start omeprazole once per day for now. See other foods to avoid.  Start norvasc for blood pressure, lipitor for cholesterol.  Labs today, but recheck labs in 6 weeks.   Let me know if I can help with referrals for tremor.   Start low, go slow for exercise.    Preventive Care 31 Years and Older, Male Preventive care refers to lifestyle choices and visits with your health care provider that can promote health and wellness. This includes:  A yearly physical exam. This is also called an annual well check.  Regular dental and eye exams.  Immunizations.  Screening for certain conditions.  Healthy lifestyle choices, such as diet and exercise. What can I expect for my preventive care visit? Physical exam Your health care provider will check:  Height and weight. These may be used to calculate body mass index (BMI), which is a measurement that tells if you are at a healthy weight.  Heart rate and blood pressure.  Your skin for abnormal spots. Counseling Your health care  provider may ask you questions about:  Alcohol, tobacco, and drug use.  Emotional well-being.  Home and relationship well-being.  Sexual activity.  Eating habits.  History of falls.  Memory and ability to understand (cognition).  Work and work Statistician. What immunizations do I need?  Influenza (flu) vaccine  This is recommended every year. Tetanus, diphtheria, and pertussis (Tdap) vaccine  You may need a Td booster every 10 years. Varicella (chickenpox) vaccine  You may need this vaccine if you have not already been vaccinated. Zoster (shingles) vaccine  You may need this after age 74. Pneumococcal conjugate (PCV13) vaccine  One dose is recommended after age 41. Pneumococcal polysaccharide (PPSV23) vaccine  One dose is recommended after age 47. Measles, mumps, and rubella (MMR) vaccine  You may need at least one dose of MMR if you were born in 1957 or later. You may also need a second dose. Meningococcal conjugate (MenACWY) vaccine  You may need this if you  have certain conditions. Hepatitis A vaccine  You may need this if you have certain conditions or if you travel or work in places where you may be exposed to hepatitis A. Hepatitis B vaccine  You may need this if you have certain conditions or if you travel or work in places where you may be exposed to hepatitis B. Haemophilus influenzae type b (Hib) vaccine  You may need this if you have certain conditions. You may receive vaccines as individual doses or as more than one vaccine together in one shot (combination vaccines). Talk with your health care provider about the risks and benefits of combination vaccines. What tests do I need? Blood tests  Lipid and cholesterol levels. These may be checked every 5 years, or more frequently depending on your overall health.  Hepatitis C test.  Hepatitis B test. Screening  Lung cancer screening. You may have this screening every year starting at age 47 if you  have a 30-pack-year history of smoking and currently smoke or have quit within the past 15 years.  Colorectal cancer screening. All adults should have this screening starting at age 61 and continuing until age 71. Your health care provider may recommend screening at age 59 if you are at increased risk. You will have tests every 1-10 years, depending on your results and the type of screening test.  Prostate cancer screening. Recommendations will vary depending on your family history and other risks.  Diabetes screening. This is done by checking your blood sugar (glucose) after you have not eaten for a while (fasting). You may have this done every 1-3 years.  Abdominal aortic aneurysm (AAA) screening. You may need this if you are a current or former smoker.  Sexually transmitted disease (STD) testing. Follow these instructions at home: Eating and drinking  Eat a diet that includes fresh fruits and vegetables, whole grains, lean protein, and low-fat dairy products. Limit your intake of foods with high amounts of sugar, saturated fats, and salt.  Take vitamin and mineral supplements as recommended by your health care provider.  Do not drink alcohol if your health care provider tells you not to drink.  If you drink alcohol: ? Limit how much you have to 0-2 drinks a day. ? Be aware of how much alcohol is in your drink. In the [place] one drink equals one 12 oz bottle of beer (355 mL), one 5 oz glass of wine (148 mL), or one 1 oz glass of hard liquor (44 mL). Lifestyle  Take daily care of your teeth and gums.  Stay active. Exercise for at least 30 minutes on 5 or more days each week.  Do not use any products that contain nicotine or tobacco, such as cigarettes, e-cigarettes, and chewing tobacco. If you need help quitting, ask your health care provider.  If you are sexually active, practice safe sex. Use a condom or other form of protection to prevent STIs (sexually transmitted infections).   Talk with your health care provider about taking a low-dose aspirin or statin. What's next?  Visit your health care provider once a year for a well check visit.  Ask your health care provider how often you should have your eyes and teeth checked.  Stay up to date on all vaccines. This information is not intended to replace advice given to you by your health care provider. Make sure you discuss any questions you have with your health care provider. Document Released: 05/17/2015 Document Revised: 04/14/2018 Document Reviewed: 04/14/2018 Elsevier Patient  Education  2020 [employer] for Gastroesophageal Reflux Disease, Adult When you have gastroesophageal reflux disease (GERD), the foods you eat and your eating habits are very important. Choosing the right foods can help ease your discomfort. Think about working with a nutrition specialist (dietitian) to help you make good choices. What are tips for following this plan?  Meals  Choose healthy foods that are low in fat, such as fruits, vegetables, whole grains, low-fat dairy products, and lean meat, fish, and poultry.  Eat small meals often instead of 3 large meals a day. Eat your meals slowly, and in a place where you are relaxed. Avoid bending over or lying down until 2-3 hours after eating.  Avoid eating meals 2-3 hours before bed.  Avoid drinking a lot of liquid with meals.  Cook foods using methods other than frying. Bake, grill, or broil food instead.  Avoid or limit: ? Chocolate. ? Peppermint or spearmint. ? Alcohol. ? Pepper. ? Black and decaffeinated coffee. ? Black and decaffeinated tea. ? Bubbly (carbonated) soft drinks. ? Caffeinated energy drinks and soft drinks.  Limit high-fat foods such as: ? Fatty meat or fried foods. ? Whole milk, cream, butter, or ice cream. ? Nuts and nut butters. ? Pastries, donuts, and sweets made with butter or shortening.  Avoid foods that cause symptoms. These foods  may be different for everyone. Common foods that cause symptoms include: ? Tomatoes. ? Oranges, lemons, and limes. ? Peppers. ? Spicy food. ? Onions and garlic. ? Vinegar. Lifestyle  Maintain a healthy weight. Ask your doctor what weight is healthy for you. If you need to lose weight, work with your doctor to do so safely.  Exercise for at least 30 minutes for 5 or more days each week, or as told by your doctor.  Wear loose-fitting clothes.  Do not smoke. If you need help quitting, ask your doctor.  Sleep with the head of your bed higher than your feet. Use a wedge under the mattress or blocks under the bed frame to raise the head of the bed. Summary  When you have gastroesophageal reflux disease (GERD), food and lifestyle choices are very important in easing your symptoms.  Eat small meals often instead of 3 large meals a day. Eat your meals slowly, and in a place where you are relaxed.  Limit high-fat foods such as fatty meat or fried foods.  Avoid bending over or lying down until 2-3 hours after eating.  Avoid peppermint and spearmint, caffeine, alcohol, and chocolate. This information is not intended to replace advice given to you by your health care provider. Make sure you discuss any questions you have with your health care provider. Document Released: 10/20/2011 Document Revised: 08/11/2018 Document Reviewed: 05/26/2016 Elsevier Patient Education  [employer].   If you have lab work done today you will be contacted with your lab results within the next 2 weeks.  If you have not heard from [place] then please contact us. The fastest way to get your results is to register for My Chart.   IF you received an x-ray today, you will receive an invoice from [facility]. Please contact [facility] at [phone] with questions or concerns regarding your invoice.   IF you received labwork today, you will receive an invoice from [facility]. Please contact [facility]  at [phone] with questions or concerns regarding your invoice.   Our billing staff will not be able to assist you with questions regarding bills  from these companies.  You will be contacted with the lab results as soon as they are available. The fastest way to get your results is to activate your My Chart account. Instructions are located on the last page of this paperwork. If you have not heard from [place] regarding the results in 2 weeks, please contact this office.       Signed,   [name], MD [facility].  02/14/19 2:01 PM

## 2019-02-13 NOTE — Patient Instructions (Addendum)
I refer you to gastroenterology for colonoscopy and to discuss heartburn.  Start omeprazole once per day for now. See other foods to avoid.  Start norvasc for blood pressure, lipitor for cholesterol.  Labs today, but recheck labs in 6 weeks.   Let me know if I can help with referrals for tremor.   Start low, go slow for exercise.    Preventive Care 65 Years and Older, Male Preventive care refers to lifestyle choices and visits with your health care provider that can promote health and wellness. This includes:  A yearly physical exam. This is also called an annual well check.  Regular dental and eye exams.  Immunizations.  Screening for certain conditions.  Healthy lifestyle choices, such as diet and exercise. What can I expect for my preventive care visit? Physical exam Your health care provider will check:  Height and weight. These may be used to calculate body mass index (BMI), which is a measurement that tells if you are at a healthy weight.  Heart rate and blood pressure.  Your skin for abnormal spots. Counseling Your health care provider may ask you questions about:  Alcohol, tobacco, and drug use.  Emotional well-being.  Home and relationship well-being.  Sexual activity.  Eating habits.  History of falls.  Memory and ability to understand (cognition).  Work and work Statistician. What immunizations do I need?  Influenza (flu) vaccine  This is recommended every year. Tetanus, diphtheria, and pertussis (Tdap) vaccine  You may need a Td booster every 10 years. Varicella (chickenpox) vaccine  You may need this vaccine if you have not already been vaccinated. Zoster (shingles) vaccine  You may need this after age 28. Pneumococcal conjugate (PCV13) vaccine  One dose is recommended after age 31. Pneumococcal polysaccharide (PPSV23) vaccine  One dose is recommended after age 60. Measles, mumps, and rubella (MMR) vaccine  You may need at least one  dose of MMR if you were born in 1957 or later. You may also need a second dose. Meningococcal conjugate (MenACWY) vaccine  You may need this if you have certain conditions. Hepatitis A vaccine  You may need this if you have certain conditions or if you travel or work in places where you may be exposed to hepatitis A. Hepatitis B vaccine  You may need this if you have certain conditions or if you travel or work in places where you may be exposed to hepatitis B. Haemophilus influenzae type b (Hib) vaccine  You may need this if you have certain conditions. You may receive vaccines as individual doses or as more than one vaccine together in one shot (combination vaccines). Talk with your health care provider about the risks and benefits of combination vaccines. What tests do I need? Blood tests  Lipid and cholesterol levels. These may be checked every 5 years, or more frequently depending on your overall health.  Hepatitis C test.  Hepatitis B test. Screening  Lung cancer screening. You may have this screening every year starting at age 76 if you have a 30-pack-year history of smoking and currently smoke or have quit within the past 15 years.  Colorectal cancer screening. All adults should have this screening starting at age 26 and continuing until age 77. Your health care provider may recommend screening at age 10 if you are at increased risk. You will have tests every 1-10 years, depending on your results and the type of screening test.  Prostate cancer screening. Recommendations will vary depending on your family history  and other risks.  Diabetes screening. This is done by checking your blood sugar (glucose) after you have not eaten for a while (fasting). You may have this done every 1-3 years.  Abdominal aortic aneurysm (AAA) screening. You may need this if you are a current or former smoker.  Sexually transmitted disease (STD) testing. Follow these instructions at home: Eating  and drinking  Eat a diet that includes fresh fruits and vegetables, whole grains, lean protein, and low-fat dairy products. Limit your intake of foods with high amounts of sugar, saturated fats, and salt.  Take vitamin and mineral supplements as recommended by your health care provider.  Do not drink alcohol if your health care provider tells you not to drink.  If you drink alcohol: ? Limit how much you have to 0-2 drinks a day. ? Be aware of how much alcohol is in your drink. In the [place] one drink equals one 12 oz bottle of beer (355 mL), one 5 oz glass of wine (148 mL), or one 1 oz glass of hard liquor (44 mL). Lifestyle  Take daily care of your teeth and gums.  Stay active. Exercise for at least 30 minutes on 5 or more days each week.  Do not use any products that contain nicotine or tobacco, such as cigarettes, e-cigarettes, and chewing tobacco. If you need help quitting, ask your health care provider.  If you are sexually active, practice safe sex. Use a condom or other form of protection to prevent STIs (sexually transmitted infections).  Talk with your health care provider about taking a low-dose aspirin or statin. What's next?  Visit your health care provider once a year for a well check visit.  Ask your health care provider how often you should have your eyes and teeth checked.  Stay up to date on all vaccines. This information is not intended to replace advice given to you by your health care provider. Make sure you discuss any questions you have with your health care provider. Document Released: 05/17/2015 Document Revised: 04/14/2018 Document Reviewed: 04/14/2018 Elsevier Patient Education  2020 [employer] for Gastroesophageal Reflux Disease, Adult When you have gastroesophageal reflux disease (GERD), the foods you eat and your eating habits are very important. Choosing the right foods can help ease your discomfort. Think about working with a  nutrition specialist (dietitian) to help you make good choices. What are tips for following this plan?  Meals  Choose healthy foods that are low in fat, such as fruits, vegetables, whole grains, low-fat dairy products, and lean meat, fish, and poultry.  Eat small meals often instead of 3 large meals a day. Eat your meals slowly, and in a place where you are relaxed. Avoid bending over or lying down until 2-3 hours after eating.  Avoid eating meals 2-3 hours before bed.  Avoid drinking a lot of liquid with meals.  Cook foods using methods other than frying. Bake, grill, or broil food instead.  Avoid or limit: ? Chocolate. ? Peppermint or spearmint. ? Alcohol. ? Pepper. ? Black and decaffeinated coffee. ? Black and decaffeinated tea. ? Bubbly (carbonated) soft drinks. ? Caffeinated energy drinks and soft drinks.  Limit high-fat foods such as: ? Fatty meat or fried foods. ? Whole milk, cream, butter, or ice cream. ? Nuts and nut butters. ? Pastries, donuts, and sweets made with butter or shortening.  Avoid foods that cause symptoms. These foods may be different for everyone. Common foods that cause symptoms  include: ? Tomatoes. ? Oranges, lemons, and limes. ? Peppers. ? Spicy food. ? Onions and garlic. ? Vinegar. Lifestyle  Maintain a healthy weight. Ask your doctor what weight is healthy for you. If you need to lose weight, work with your doctor to do so safely.  Exercise for at least 30 minutes for 5 or more days each week, or as told by your doctor.  Wear loose-fitting clothes.  Do not smoke. If you need help quitting, ask your doctor.  Sleep with the head of your bed higher than your feet. Use a wedge under the mattress or blocks under the bed frame to raise the head of the bed. Summary  When you have gastroesophageal reflux disease (GERD), food and lifestyle choices are very important in easing your symptoms.  Eat small meals often instead of 3 large meals a  day. Eat your meals slowly, and in a place where you are relaxed.  Limit high-fat foods such as fatty meat or fried foods.  Avoid bending over or lying down until 2-3 hours after eating.  Avoid peppermint and spearmint, caffeine, alcohol, and chocolate. This information is not intended to replace advice given to you by your health care provider. Make sure you discuss any questions you have with your health care provider. Document Released: 10/20/2011 Document Revised: 08/11/2018 Document Reviewed: 05/26/2016 Elsevier Patient Education  [employer].   If you have lab work done today you will be contacted with your lab results within the next 2 weeks.  If you have not heard from [place] then please contact us. The fastest way to get your results is to register for My Chart.   IF you received an x-ray today, you will receive an invoice from [facility]. Please contact [facility] at [phone] with questions or concerns regarding your invoice.   IF you received labwork today, you will receive an invoice from [facility]. Please contact [facility] at [phone] with questions or concerns regarding your invoice.   Our billing staff will not be able to assist you with questions regarding bills from these companies.  You will be contacted with the lab results as soon as they are available. The fastest way to get your results is to activate your My Chart account. Instructions are located on the last page of this paperwork. If you have not heard from [place] regarding the results in 2 weeks, please contact this office.

## 2019-02-14 ENCOUNTER — Encounter: Payer: Self-pay | Admitting: Family Medicine

## 2019-02-14 LAB — CBC
Hematocrit: 45 % (ref 37.5–51.0)
Hemoglobin: 15.5 g/dL (ref 13.0–17.7)
MCH: 29.7 pg (ref 26.6–33.0)
MCHC: 34.4 g/dL (ref 31.5–35.7)
MCV: 86 fL (ref 79–97)
Platelets: 332 10*3/uL (ref 150–450)
RBC: 5.22 x10E6/uL (ref 4.14–5.80)
RDW: 12.7 % (ref 11.6–15.4)
WBC: 7.1 10*3/uL (ref 3.4–10.8)

## 2019-02-14 LAB — COMPREHENSIVE METABOLIC PANEL
ALT: 15 IU/L (ref 0–44)
AST: 29 IU/L (ref 0–40)
Albumin/Globulin Ratio: 2 (ref 1.2–2.2)
Albumin: 4.5 g/dL (ref 3.8–4.8)
Alkaline Phosphatase: 77 IU/L (ref 39–117)
BUN/Creatinine Ratio: 14 (ref 10–24)
BUN: 15 mg/dL (ref 8–27)
Bilirubin Total: 0.6 mg/dL (ref 0.0–1.2)
CO2: 22 mmol/L (ref 20–29)
Calcium: 9.5 mg/dL (ref 8.6–10.2)
Chloride: 103 mmol/L (ref 96–106)
Creatinine, Ser: 1.04 mg/dL (ref 0.76–1.27)
GFR calc Af Amer: 84 mL/min/{1.73_m2} (ref >59)
GFR calc non Af Amer: 72 mL/min/{1.73_m2} (ref >59)
Globulin, Total: 2.3 g/dL (ref 1.5–4.5)
Glucose: 81 mg/dL (ref 65–99)
Potassium: 4.5 mmol/L (ref 3.5–5.2)
Sodium: 141 mmol/L (ref 134–144)
Total Protein: 6.8 g/dL (ref 6.0–8.5)

## 2019-02-14 LAB — LIPID PANEL
Chol/HDL Ratio: 3.6 ratio (ref 0.0–5.0)
Cholesterol, Total: 218 mg/dL — ABNORMAL HIGH (ref 100–199)
HDL: 61 mg/dL (ref >39)
LDL Chol Calc (NIH): 135 mg/dL — ABNORMAL HIGH (ref 0–99)
Triglycerides: 126 mg/dL (ref 0–149)
VLDL Cholesterol Cal: 22 mg/dL (ref 5–40)

## 2019-02-14 LAB — TSH: TSH: 2.23 u[IU]/mL (ref 0.450–4.500)

## 2019-02-16 ENCOUNTER — Encounter: Payer: Self-pay | Admitting: Gastroenterology

## 2019-02-17 ENCOUNTER — Telehealth: Payer: Self-pay | Admitting: Family Medicine

## 2019-02-17 NOTE — Telephone Encounter (Signed)
Pt is wanting a call to go over lab results  Fr

## 2019-02-21 ENCOUNTER — Encounter: Payer: Self-pay | Admitting: Family Medicine

## 2019-02-21 DIAGNOSIS — M5030 Other cervical disc degeneration, unspecified cervical region: Secondary | ICD-10-CM | POA: Diagnosis not present

## 2019-02-21 DIAGNOSIS — M5412 Radiculopathy, cervical region: Secondary | ICD-10-CM | POA: Diagnosis not present

## 2019-02-21 DIAGNOSIS — M542 Cervicalgia: Secondary | ICD-10-CM | POA: Diagnosis not present

## 2019-02-24 ENCOUNTER — Telehealth: Payer: Self-pay | Admitting: Gastroenterology

## 2019-02-24 NOTE — Telephone Encounter (Signed)
Pt called stating that he was returning your call about coming at 1:10pm instead on 1:50pm for his appt on Monday with Dr. Silverio Decamp. He wanted to confirm that he will be here at 1:00pm.

## 2019-02-24 NOTE — Telephone Encounter (Signed)
Patient to come in on Monday at 1:10pm

## 2019-02-27 ENCOUNTER — Encounter: Payer: Self-pay | Admitting: Gastroenterology

## 2019-02-27 ENCOUNTER — Ambulatory Visit: Payer: Medicare HMO | Admitting: Gastroenterology

## 2019-02-27 ENCOUNTER — Telehealth: Payer: Self-pay | Admitting: *Deleted

## 2019-02-27 VITALS — BP 124/72 | HR 72 | Temp 98.3°F | Ht 67.0 in | Wt 184.0 lb

## 2019-02-27 DIAGNOSIS — R140 Abdominal distension (gaseous): Secondary | ICD-10-CM | POA: Diagnosis not present

## 2019-02-27 DIAGNOSIS — R6881 Early satiety: Secondary | ICD-10-CM | POA: Diagnosis not present

## 2019-02-27 DIAGNOSIS — R1013 Epigastric pain: Secondary | ICD-10-CM | POA: Diagnosis not present

## 2019-02-27 DIAGNOSIS — Z1211 Encounter for screening for malignant neoplasm of colon: Secondary | ICD-10-CM

## 2019-02-27 DIAGNOSIS — K219 Gastro-esophageal reflux disease without esophagitis: Secondary | ICD-10-CM | POA: Diagnosis not present

## 2019-02-27 MED ORDER — SUPREP BOWEL PREP KIT 17.5-3.13-1.6 GM/177ML PO SOLN: 1.0000 | ORAL | 0 refills | Status: DC

## 2019-02-27 NOTE — Telephone Encounter (Signed)
Received approval fax from [facility] 02-21-20 thru 08-25-2019.  Auth service [id number], [phone]  ID [id number].  Ref # [id number].

## 2019-02-27 NOTE — Telephone Encounter (Signed)
Noted thanks Alan Ball will send to [facility] . Received approval fax from [facility] 02-21-20 thru 08-25-2019.  Auth service [phone], [phone]  ID [id number].  Ref # [id number].   Sent to [employer] to [employer].

## 2019-02-27 NOTE — Progress Notes (Signed)
Alan Ball    [id number]    December 17, 1950  Primary Care Physician:Greene, Ranell Patrick, MD  Referring Physician: [name], MD [address] [place],  [place] [address]   Chief complaint: GERD, bloated, epigastric discomfort HPI:  68 year old male here for new patient visit. He is due for colorectal cancer screening, last colonoscopy over 10 years ago by [facility], was normal per patient with no polyps.  Report is not available during this visit to review. Complain of worsening reflux symptoms with daily heartburn despite taking omeprazole.  He also has epigastric abdominal discomfort and early satiety.  Upper abdomen discomfort is worse after a meal.  He feels his abdomen is tight and bloated.  Denies any significant change in bowel habits, melena or blood per rectum. No history of gastric or colon cancer in his immediate family. He has resting tremor and is undergoing work-up for it, Parkinson's disease in the differential and is empirically on treatment for it.   Outpatient Encounter Medications as of 02/27/2019  Medication Sig  . albuterol (VENTOLIN HFA) 108 (90 Base) MCG/ACT inhaler Inhale 1-2 puffs into the lungs every 4 (four) hours as needed for wheezing or shortness of breath.  Marland Kitchen amLODipine (NORVASC) 2.5 MG tablet Take 1 tablet (2.5 mg total) by mouth daily.  Marland Kitchen atorvastatin (LIPITOR) 10 MG tablet Take 1 tablet (10 mg total) by mouth daily.  . carbidopa-levodopa (SINEMET IR) 25-100 MG tablet Take 1 tablet by mouth 4 (four) times daily.  Marland Kitchen ibuprofen (ADVIL,MOTRIN) 200 MG tablet Take 200 mg by mouth every 6 (six) hours as needed for pain.  . Multiple Vitamin (MULTIVITAMIN) tablet Take 1 tablet by mouth.  Marland Kitchen omeprazole (PRILOSEC) 20 MG capsule Take 1 capsule (20 mg total) by mouth daily.  . vitamin C (ASCORBIC ACID) 500 MG tablet Take 500 mg by mouth daily.   No facility-administered encounter medications on file as of 02/27/2019.     Allergies as of 02/27/2019   . (No Known Allergies)    Past Medical History:  Diagnosis Date  . Allergy   . Asthma   . Hyperlipemia   . Hypertension   . Tremor     Past Surgical History:  Procedure Laterality Date  . CARPAL TUNNEL RELEASE Bilateral 2019, 2020  . HERNIA REPAIR Bilateral    inguinal  . SHOULDER SURGERY      Family History  Problem Relation Age of Onset  . Cancer Father   . Diabetes Father     Social History   Socioeconomic History  . Marital status: Married    Spouse name: Romie Minus  . Number of children: Not on file  . Years of education: Not on file  . Highest education level: Bachelor's degree (e.g., BA, AB, BS)  Occupational History    Comment: retired  Scientific laboratory technician  . Financial resource strain: Not on file  . Food insecurity    Worry: Not on file    Inability: Not on file  . Transportation needs    Medical: Not on file    Non-medical: Not on file  Tobacco Use  . Smoking status: Never Smoker  . Smokeless tobacco: Never Used  Substance and Sexual Activity  . Alcohol use: No  . Drug use: No  . Sexual activity: Never  Lifestyle  . Physical activity    Days per week: Not on file    Minutes per session: Not on file  . Stress: Not on  file  Relationships  . Social Herbalist on phone: Not on file    Gets together: Not on file    Attends religious service: Not on file    Active member of club or organization: Not on file    Attends meetings of clubs or organizations: Not on file    Relationship status: Not on file  . Intimate partner violence    Fear of current or ex partner: Not on file    Emotionally abused: Not on file    Physically abused: Not on file    Forced sexual activity: Not on file  Other Topics Concern  . Not on file  Social History Narrative   Lives with wife   Caffeine- coffee 3 cups daily, maybe a soda      Review of systems: Review of Systems  Constitutional: Negative for fever and chills.  HENT: positive for sinus problem Eyes:  Negative for blurred vision.  Respiratory: Negative for cough, shortness of breath and wheezing.   Cardiovascular: Negative for chest pain and palpitations.  Gastrointestinal: as per HPI Genitourinary: Negative for dysuria, urgency, frequency and hematuria.  Musculoskeletal: Positive for myalgias, back pain and joint pain.  Skin: Negative for itching and rash.  Neurological: Negative for dizziness, tremors, focal weakness, seizures and loss of consciousness.  Endo/Heme/Allergies: Positive for seasonal allergies.  Psychiatric/Behavioral: Negative for depression, suicidal ideas and hallucinations.  All other systems reviewed and are negative.   Physical Exam: Vitals:   02/27/19 1308  Temp: 98.3 F (36.8 C)   Body mass index is 28.82 kg/m. Gen:      No acute distress HEENT:  EOMI, sclera anicteric Neck:     No masses; no thyromegaly Lungs:    Clear to auscultation bilaterally; normal respiratory effort CV:         Regular rate and rhythm; no murmurs Abd:      + bowel sounds; soft, non-tender; no palpable masses, no distension Ext:    No edema; adequate peripheral perfusion Skin:      Warm and dry; no rash Neuro: alert and oriented x 3 Psych: normal mood and affect  Data Reviewed:  Reviewed labs, radiology imaging, old records and pertinent past GI work up   Assessment and Plan/Recommendations:  68 year old male with history of hypertension, hyperlipidemia with complaints of heartburn, epigastric abdominal pain, decreased appetite and postprandial fullness. He is also due for colorectal cancer screening We will schedule for EGD for evaluation of upper GI symptoms along with colonoscopy for colorectal cancer screening The risks and benefits as well as alternatives of endoscopic procedure(s) have been discussed and reviewed. All questions answered. The patient agrees to proceed.  Discussed antireflux measures in detail Continue omeprazole 20 mg daily   K. Denzil Magnuson , MD     CC: [name], MD

## 2019-02-27 NOTE — Progress Notes (Signed)
I reviewed note and agree with plan.   Penni Bombard, MD [id number], [id number] AM Certified in Neurology, Neurophysiology and Neuroimaging  [facility] [address], [place], [place] [address] [phone]

## 2019-02-27 NOTE — Patient Instructions (Addendum)
If you are age 70 or older, your body mass index should be between 23-30. Your Body mass index is 28.82 kg/m. If this is out of the aforementioned range listed, please consider follow up with your Primary Care Provider.  If you are age 83 or younger, your body mass index should be between 19-25. Your Body mass index is 28.82 kg/m. If this is out of the aformentioned range listed, please consider follow up with your Primary Care Provider.    You have been scheduled for an endoscopy and colonoscopy. Please follow the written instructions given to you at your visit today. Please pick up your prep supplies at the pharmacy within the next 1-3 days. If you use inhalers (even only as needed), please bring them with you on the day of your procedure.   Continue Omeprazole as directed.   Thank you for choosing me and [facility].  Dr. Silverio Decamp

## 2019-03-01 ENCOUNTER — Telehealth: Payer: Self-pay | Admitting: Family Medicine

## 2019-03-01 NOTE — Telephone Encounter (Signed)
Andee Poles can resend all notes and any imaging please to Telephone [phone] - fax [phone]. [facility] . Thank you.

## 2019-03-02 NOTE — Telephone Encounter (Signed)
Faxed referral to number below

## 2019-03-08 ENCOUNTER — Encounter: Payer: Self-pay | Admitting: Gastroenterology

## 2019-03-09 ENCOUNTER — Ambulatory Visit (HOSPITAL_COMMUNITY): Admission: RE | Admit: 2019-03-09 | Payer: Medicare HMO | Source: Ambulatory Visit

## 2019-03-09 ENCOUNTER — Other Ambulatory Visit (HOSPITAL_COMMUNITY): Payer: Medicare HMO

## 2019-03-10 ENCOUNTER — Other Ambulatory Visit: Payer: Self-pay | Admitting: Gastroenterology

## 2019-03-10 DIAGNOSIS — Z1159 Encounter for screening for other viral diseases: Secondary | ICD-10-CM | POA: Diagnosis not present

## 2019-03-10 LAB — SARS CORONAVIRUS 2 (TAT 6-24 HRS): SARS Coronavirus 2: NEGATIVE

## 2019-03-13 ENCOUNTER — Encounter: Payer: Self-pay | Admitting: Gastroenterology

## 2019-03-13 ENCOUNTER — Other Ambulatory Visit: Payer: Self-pay

## 2019-03-13 ENCOUNTER — Ambulatory Visit (AMBULATORY_SURGERY_CENTER): Payer: Medicare HMO | Admitting: Gastroenterology

## 2019-03-13 VITALS — BP 124/70 | HR 55 | Temp 97.8°F | Resp 12 | Ht 67.0 in | Wt 184.0 lb

## 2019-03-13 DIAGNOSIS — K635 Polyp of colon: Secondary | ICD-10-CM

## 2019-03-13 DIAGNOSIS — Z1211 Encounter for screening for malignant neoplasm of colon: Secondary | ICD-10-CM | POA: Diagnosis not present

## 2019-03-13 DIAGNOSIS — D122 Benign neoplasm of ascending colon: Secondary | ICD-10-CM

## 2019-03-13 DIAGNOSIS — K449 Diaphragmatic hernia without obstruction or gangrene: Secondary | ICD-10-CM | POA: Diagnosis not present

## 2019-03-13 DIAGNOSIS — D128 Benign neoplasm of rectum: Secondary | ICD-10-CM

## 2019-03-13 DIAGNOSIS — K219 Gastro-esophageal reflux disease without esophagitis: Secondary | ICD-10-CM

## 2019-03-13 DIAGNOSIS — K2950 Unspecified chronic gastritis without bleeding: Secondary | ICD-10-CM

## 2019-03-13 DIAGNOSIS — K2270 Barrett's esophagus without dysplasia: Secondary | ICD-10-CM | POA: Diagnosis not present

## 2019-03-13 DIAGNOSIS — D129 Benign neoplasm of anus and anal canal: Secondary | ICD-10-CM

## 2019-03-13 DIAGNOSIS — R1013 Epigastric pain: Secondary | ICD-10-CM

## 2019-03-13 DIAGNOSIS — K621 Rectal polyp: Secondary | ICD-10-CM

## 2019-03-13 DIAGNOSIS — R109 Unspecified abdominal pain: Secondary | ICD-10-CM | POA: Diagnosis not present

## 2019-03-13 MED ORDER — SODIUM CHLORIDE 0.9 % IV SOLN: 500.0000 mL | Freq: Once | INTRAVENOUS | Status: DC

## 2019-03-13 NOTE — Op Note (Signed)
[place] Patient Name: Alan Ball Procedure Date: 03/13/2019 2:15 PM MRN: [id number] Endoscopist: Mauri Pole , MD Age: 68 Referring MD:  Date of Birth: 16-Jan-1951 Gender: Male Account #: [id number] Procedure:                Colonoscopy Indications:              Screening for colorectal malignant neoplasm Medicines:                Monitored Anesthesia Care Procedure:                Pre-Anesthesia Assessment:                           - Prior to the procedure, a History and Physical                            was performed, and patient medications and                            allergies were reviewed. The patient's tolerance of                            previous anesthesia was also reviewed. The risks                            and benefits of the procedure and the sedation                            options and risks were discussed with the patient.                            All questions were answered, and informed consent                            was obtained. Prior Anticoagulants: The patient has                            taken no previous anticoagulant or antiplatelet                            agents. ASA Grade Assessment: II - A patient with                            mild systemic disease. After reviewing the risks                            and benefits, the patient was deemed in                            satisfactory condition to undergo the procedure.                           After obtaining informed consent, the colonoscope  was passed under direct vision. Throughout the                            procedure, the patient's blood pressure, pulse, and                            oxygen saturations were monitored continuously. The                            Colonoscope was introduced through the anus and                            advanced to the the cecum, identified by                            appendiceal orifice and  ileocecal valve. The                            colonoscopy was performed without difficulty. The                            patient tolerated the procedure well. The quality                            of the bowel preparation was good. The ileocecal                            valve, appendiceal orifice, and rectum were                            photographed. Scope In: 2:30:00 PM Scope Out: 2:50:39 PM Scope Withdrawal Time: 0 hours 16 minutes 56 seconds  Total Procedure Duration: 0 hours 20 minutes 39 seconds  Findings:                 The perianal and digital rectal examinations were                            normal.                           Three sessile polyps were found in the rectum and                            ascending colon. The polyps were 5 to 11 mm in                            size. These polyps were removed with a cold snare.                            Resection and retrieval were complete.                           Non-bleeding internal hemorrhoids were found during  retroflexion. The hemorrhoids were small.                           The exam was otherwise without abnormality. Complications:            No immediate complications. Estimated Blood Loss:     Estimated blood loss was minimal. Impression:               - Three 5 to 11 mm polyps in the rectum and in the                            ascending colon, removed with a cold snare.                            Resected and retrieved.                           - Non-bleeding internal hemorrhoids.                           - The examination was otherwise normal. Recommendation:           - Patient has a contact number available for                            emergencies. The signs and symptoms of potential                            delayed complications were discussed with the                            patient. Return to normal activities tomorrow.                            Written discharge  instructions were provided to the                            patient.                           - Resume previous diet.                           - Continue present medications.                           - Await pathology results.                           - Repeat colonoscopy in 3 - 5 years for                            surveillance based on pathology results. Mauri Pole, MD 03/13/2019 3:00:31 PM This report has been signed electronically.

## 2019-03-13 NOTE — Progress Notes (Signed)
Called to room to assist during endoscopic procedure.  Patient ID and intended procedure confirmed with present staff. Received instructions for my participation in the procedure from the performing physician.  

## 2019-03-13 NOTE — Progress Notes (Signed)
PT taken to PACU. Monitors in place. VSS. Report given to RN. 

## 2019-03-13 NOTE — Progress Notes (Signed)
Temp by LC. VS by CB

## 2019-03-13 NOTE — Op Note (Signed)
[place] Patient Name: Alan Ball Procedure Date: 03/13/2019 2:16 PM MRN: [id number] Endoscopist: Mauri Pole , MD Age: 68 Referring MD:  Date of Birth: 07/28/1950 Gender: Male Account #: [id number] Procedure:                Upper GI endoscopy Indications:              Esophageal reflux symptoms that recur despite                            appropriate therapy Medicines:                Monitored Anesthesia Care Procedure:                Pre-Anesthesia Assessment:                           - Prior to the procedure, a History and Physical                            was performed, and patient medications and                            allergies were reviewed. The patient's tolerance of                            previous anesthesia was also reviewed. The risks                            and benefits of the procedure and the sedation                            options and risks were discussed with the patient.                            All questions were answered, and informed consent                            was obtained. Prior Anticoagulants: The patient has                            taken no previous anticoagulant or antiplatelet                            agents. ASA Grade Assessment: II - A patient with                            mild systemic disease. After reviewing the risks                            and benefits, the patient was deemed in                            satisfactory condition to undergo the procedure.  After obtaining informed consent, the endoscope was                            passed under direct vision. Throughout the                            procedure, the patient's blood pressure, pulse, and                            oxygen saturations were monitored continuously. The                            Endoscope was introduced through the mouth, and                            advanced to the third part of duodenum.  The upper                            GI endoscopy was accomplished without difficulty.                            The patient tolerated the procedure well. Scope In: Scope Out: Findings:                 There were esophageal mucosal changes suspicious                            for short-segment Barrett's esophagus present in                            the lower third of the esophagus. The maximum                            longitudinal extent of these mucosal changes was 3                            cm in length. Mucosa was biopsied with a cold                            forceps for histology in a targeted manner at                            intervals of 1 cm in the lower third of the                            esophagus. One specimen bottle was sent to                            pathology.                           A small hiatal hernia was present.  The cardia and gastric fundus were normal on                            retroflexion.                           The examined duodenum was normal. Complications:            No immediate complications. Estimated Blood Loss:     Estimated blood loss was minimal. Impression:               - Esophageal mucosal changes suspicious for                            short-segment Barrett's esophagus. Biopsied.                           - Small hiatal hernia.                           - Normal examined duodenum. Recommendation:           - Patient has a contact number available for                            emergencies. The signs and symptoms of potential                            delayed complications were discussed with the                            patient. Return to normal activities tomorrow.                            Written discharge instructions were provided to the                            patient.                           - Resume previous diet.                           - Continue present medications.                            - Await pathology results.                           - Repeat upper endoscopy after studies are complete                            for surveillance based on pathology results. Mauri Pole, MD 03/13/2019 2:58:38 PM This report has been signed electronically.

## 2019-03-13 NOTE — Patient Instructions (Signed)
YOU HAD AN ENDOSCOPIC PROCEDURE TODAY AT THE [facility]:   Refer to the procedure report that was given to you for any specific questions about what was found during the examination.  If the procedure report does not answer your questions, please call your gastroenterologist to clarify.  If you requested that your care partner not be given the details of your procedure findings, then the procedure report has been included in a sealed envelope for you to review at your convenience later.  YOU SHOULD EXPECT: Some feelings of bloating in the abdomen. Passage of more gas than usual.  Walking can help get rid of the air that was put into your GI tract during the procedure and reduce the bloating. If you had a lower endoscopy (such as a colonoscopy or flexible sigmoidoscopy) you may notice spotting of blood in your stool or on the toilet paper. If you underwent a bowel prep for your procedure, you may not have a normal bowel movement for a few days.  Please Note:  You might notice some irritation and congestion in your nose or some drainage.  This is from the oxygen used during your procedure.  There is no need for concern and it should clear up in a day or so.  SYMPTOMS TO REPORT IMMEDIATELY:   Following lower endoscopy (colonoscopy or flexible sigmoidoscopy):  Excessive amounts of blood in the stool  Significant tenderness or worsening of abdominal pains  Swelling of the abdomen that is new, acute  Fever of 100F or higher   Following upper endoscopy (EGD)  Vomiting of blood or coffee ground material  New chest pain or pain under the shoulder blades  Painful or persistently difficult swallowing  New shortness of breath  Fever of 100F or higher  Black, tarry-looking stools  For urgent or emergent issues, a gastroenterologist can be reached at any hour by calling [phone].   DIET:  We do recommend a small meal at first, but then you may proceed to your regular diet.  Drink  plenty of fluids but you should avoid alcoholic beverages for 24 hours.  ACTIVITY:  You should plan to take it easy for the rest of today and you should NOT DRIVE or use heavy machinery until tomorrow (because of the sedation medicines used during the test).    FOLLOW UP: Our staff will call the number listed on your records 48-72 hours following your procedure to check on you and address any questions or concerns that you may have regarding the information given to you following your procedure. If we do not reach you, we will leave a message.  We will attempt to reach you two times.  During this call, we will ask if you have developed any symptoms of COVID 19. If you develop any symptoms (ie: fever, flu-like symptoms, shortness of breath, cough etc.) before then, please call [phone].  If you test positive for Covid 19 in the 2 weeks post procedure, please call and report this information to us.    If any biopsies were taken you will be contacted by phone or by letter within the next 1-3 weeks.  Please call us at [phone] if you have not heard about the biopsies in 3 weeks.    SIGNATURES/CONFIDENTIALITY: You and/or your care partner have signed paperwork which will be entered into your electronic medical record.  These signatures attest to the fact that that the information above on your After Visit Summary has been reviewed and is   understood.  Full responsibility of the confidentiality of this discharge information lies with you and/or your care-partner. 

## 2019-03-15 ENCOUNTER — Ambulatory Visit: Payer: Medicare HMO | Admitting: Family Medicine

## 2019-03-15 ENCOUNTER — Telehealth: Payer: Self-pay

## 2019-03-15 NOTE — Telephone Encounter (Signed)
  Follow up Call-  Call back number 03/13/2019  Post procedure Call Back phone  # [phone]  Permission to leave phone message Yes  Some recent data might be hidden     Patient questions:  Do you have a fever, pain , or abdominal swelling? No. Pain Score  0 *  Have you tolerated food without any problems? Yes.    Have you been able to return to your normal activities? Yes.    Do you have any questions about your discharge instructions: Diet   No. Medications  No. Follow up visit  No.  Do you have questions or concerns about your Care? No.  Actions: * If pain score is 4 or above: No action needed, pain <4.   1. Have you developed a fever since your procedure? no  2.   Have you had an respiratory symptoms (SOB or cough) since your procedure? no  3.   Have you tested positive for COVID 19 since your procedure no  4.   Have you had any family members/close contacts diagnosed with the COVID 19 since your procedure?  no   If yes to any of these questions please route to Joylene John, RN and Alphonsa Gin, Therapist, sports.

## 2019-03-16 ENCOUNTER — Ambulatory Visit (HOSPITAL_COMMUNITY)
Admission: RE | Admit: 2019-03-16 | Discharge: 2019-03-16 | Disposition: A | Discharge status: Home / Self Care | Payer: Medicare HMO | Source: Ambulatory Visit | Attending: Family Medicine | Admitting: Family Medicine

## 2019-03-16 ENCOUNTER — Other Ambulatory Visit: Payer: Self-pay

## 2019-03-16 DIAGNOSIS — R251 Tremor, unspecified: Secondary | ICD-10-CM | POA: Diagnosis not present

## 2019-03-16 DIAGNOSIS — G252 Other specified forms of tremor: Secondary | ICD-10-CM | POA: Diagnosis not present

## 2019-03-16 DIAGNOSIS — R2689 Other abnormalities of gait and mobility: Secondary | ICD-10-CM | POA: Insufficient documentation

## 2019-03-16 DIAGNOSIS — R29898 Other symptoms and signs involving the musculoskeletal system: Secondary | ICD-10-CM | POA: Diagnosis present

## 2019-03-16 MED ORDER — IODINE STRONG (LUGOLS) 5 % PO SOLN
0.8000 mL | Freq: Once | ORAL | Status: AC
  Administered 2019-03-16: 11:00:00 0.8 mL via ORAL

## 2019-03-17 ENCOUNTER — Ambulatory Visit (HOSPITAL_COMMUNITY): Admission: RE | Admit: 2019-03-17 | Payer: Medicare HMO | Source: Ambulatory Visit

## 2019-03-17 ENCOUNTER — Other Ambulatory Visit (HOSPITAL_COMMUNITY): Payer: Medicare HMO

## 2019-03-21 ENCOUNTER — Encounter: Payer: Self-pay | Admitting: Gastroenterology

## 2019-03-22 ENCOUNTER — Ambulatory Visit: Payer: Medicare HMO | Admitting: Family Medicine

## 2019-03-22 ENCOUNTER — Encounter: Payer: Self-pay | Admitting: Family Medicine

## 2019-03-22 ENCOUNTER — Other Ambulatory Visit: Payer: Self-pay

## 2019-03-22 VITALS — BP 138/88 | HR 63 | Temp 97.7°F | Ht 67.0 in | Wt 185.0 lb

## 2019-03-22 DIAGNOSIS — G252 Other specified forms of tremor: Secondary | ICD-10-CM

## 2019-03-22 DIAGNOSIS — M4802 Spinal stenosis, cervical region: Secondary | ICD-10-CM | POA: Diagnosis not present

## 2019-03-22 DIAGNOSIS — G5603 Carpal tunnel syndrome, bilateral upper limbs: Secondary | ICD-10-CM

## 2019-03-22 NOTE — Progress Notes (Signed)
PATIENT: Alan Ball DOB: August 15, 1950  REASON FOR VISIT: follow up HISTORY FROM: patient  Chief Complaint  Patient presents with   Follow-up    New room, with daughter. test results.      HISTORY OF PRESENT ILLNESS: Today 03/22/19 Alan Ball is a 68 y.o. male here today for follow up for resting tremor, weakness of left arm and concerns of imbalance. MRI in 01/2019 unremarkable. DaT scan 03/16/2019 negative for PD. Resting tremor of left hand and forearm continues. Left arm weakness is stable. He does not feel extra dose of Sinemet has helped at all. He does note improvement in his balance but contributes this to being more active. He no longer feels that he is leaning forward or is off balance. He has not noticed and slowing of movements. He has had some neck pain with radiation to right arm and is being followed by Dr Carlota Raspberry ([facility]). He had MRI cervical spine and NCS/EMG but is not certain of the results. Last note from Dr Carlota Raspberry on 02/21/19 reports "Multilevel degenerative cervical foraminal and lateral recess stenosis is noted. C3-4 severe right, moderate to severe left foraminal narrowing with impingement on the exiting C4 nerve root. C4-5: Severe bilateral neuroforaminal stenosis with impingement on the C5 nerve roots. Moderate to severe central canal stenosis. C5-6: Severe right and moderate to severe left neuroforaminal narrowing as well as moderate central stenosis. C6-7: Moderate to severe right and mild to moderate left foraminal narrowing with primary impingement on the right C7 nerve root. Mild reversal of normal cervical lordosis. Slight anterior listhesis of C3-4." NCS/EMG results showed  "Severe left carpal tunnel syndrome with involvement of the median motor and sensory nerve. No evidence of left ulnar neuropathy at the wrist or elbow. No evidence of left upper limb cervical radiculopathy or brachial plexopathy." He was advised to consider PT but has not started. He was  advised to follow up with Dr Burney Gauze (hand surgeon) regarding these results. He is s/p right CTR in 2019 and left CTR in 11/2018.   HISTORY: (copied from my note on 01/31/2019)  Alan Ball is a 68 y.o. male here today for follow up for tremor. He continues to have tremor of left hand at rest. Seems to improve with activity. Has not noticed tremor elsewhere. Occasionally he has felt as if he is leaning forward and a bit unbalanced at times. He has not noticed a shuffling gait or short steps. No falls. No concerns with memory or facial changes. No concerns of depression or anxiety. No changes in voice or trouble swallowing. 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  He started carb/levo 1/2 tablet three times daily then increased to 1 tablet three times daily. He does not feel this has helped any at all. He admits that he has taken it with food mostly and has not paid attention to tremor much. His daughter reports no significant change. He has tolerated medication well with no obvious adverse effects. 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 MRI 01/05/2019 unremarkable with exception of mild chronic small vessel ischemia and chronic right maxillary sinusitis. He reports BP is usually elevated in the mornings. Usually 140's/80's in the morning and 135/80's later in the day. No headaches, dizziness or chest pain. Last lipid panel in 2018 showed HLD with LDL 134. He is not on statin therapy or aspirin. No stroke  like symptoms.    HISTORY: (copied from Dr Gladstone Lighter note on 12/06/2018)  68 year old male here for evaluation of left hand tremor. Symptoms started 4 months ago, gradual onset and progressive. He notes resting tremor mainly in his left hand and arm. This affects him randomly throughout the day. Symptoms worsening over time. He notes this when he is walking. Sometimes his left wrist flexes.  No problem with right  hand. No problem with legs. No balance or walking issues. No change in smell or taste. No vivid dreams or acting out during sleep.  No family history of tremor.   REVIEW OF SYSTEMS: Out of a complete 14 system review of symptoms, the patient complains only of the following symptoms, tremor, weakness and all other reviewed systems are negative.  ALLERGIES: No Known Allergies  HOME MEDICATIONS: Outpatient Medications Prior to Visit  Medication Sig Dispense Refill   albuterol (VENTOLIN HFA) 108 (90 Base) MCG/ACT inhaler Inhale 1-2 puffs into the lungs every 4 (four) hours as needed for wheezing or shortness of breath. 18 g 0   amLODipine (NORVASC) 2.5 MG tablet Take 1 tablet (2.5 mg total) by mouth daily. 90 tablet 1   atorvastatin (LIPITOR) 10 MG tablet Take 1 tablet (10 mg total) by mouth daily. 90 tablet 0   carbidopa-levodopa (SINEMET IR) 25-100 MG tablet Take 1 tablet by mouth 4 (four) times daily. 120 tablet 5   ibuprofen (ADVIL,MOTRIN) 200 MG tablet Take 200 mg by mouth every 6 (six) hours as needed for pain.     Multiple Vitamin (MULTIVITAMIN) tablet Take 1 tablet by mouth.     omeprazole (PRILOSEC) 20 MG capsule Take 1 capsule (20 mg total) by mouth daily. 30 capsule 1   vitamin C (ASCORBIC ACID) 500 MG tablet Take 500 mg by mouth daily.     No facility-administered medications prior to visit.     PAST MEDICAL HISTORY: Past Medical History:  Diagnosis Date   Allergy    Asthma    Hyperlipemia    Hypertension    Tremor     PAST SURGICAL HISTORY: Past Surgical History:  Procedure Laterality Date   CARPAL TUNNEL RELEASE Bilateral 2019, 2020   HERNIA REPAIR Bilateral    inguinal   SHOULDER SURGERY      FAMILY HISTORY: Family History  Problem Relation Age of Onset   Hypertension Mother    Thyroid disease Mother    Cancer Father        brain tumor (gleoplastoma)   Diabetes Father    Colon polyps Father    Colon cancer Neg Hx    Stomach  cancer Neg Hx    Rectal cancer Neg Hx    Esophageal cancer Neg Hx     SOCIAL HISTORY: Social History   Socioeconomic History   Marital status: Married    Spouse name: Romie Minus   Number of children: Not on file   Years of education: Not on file   Highest education level: Bachelor's degree (e.g., BA, AB, BS)  Occupational History    Comment: retired  Scientist, product/process development strain: Not on file   Food insecurity    Worry: Not on file    Inability: Not on Lexicographer needs    Medical: Not on file    Non-medical: Not on file  Tobacco Use   Smoking status: Never Smoker   Smokeless tobacco: Never Used  Substance and Sexual Activity   Alcohol use: Yes    Comment: socially  Drug use: No   Sexual activity: Never  Lifestyle   Physical activity    Days per week: Not on file    Minutes per session: Not on file   Stress: Not on file  Relationships   Social connections    Talks on phone: Not on file    Gets together: Not on file    Attends religious service: Not on file    Active member of club or organization: Not on file    Attends meetings of clubs or organizations: Not on file    Relationship status: Not on file   Intimate partner violence    Fear of current or ex partner: Not on file    Emotionally abused: Not on file    Physically abused: Not on file    Forced sexual activity: Not on file  Other Topics Concern   Not on file  Social History Narrative   Lives with wife   Caffeine- coffee 3 cups daily, maybe a soda      PHYSICAL EXAM  Vitals:   03/22/19 1430  BP: 138/88  Pulse: 63  Temp: 97.7 F (36.5 C)  Weight: 185 lb (83.9 kg)  Height: 5\' 7"  (1.702 m)   Body mass index is 28.98 kg/m.  Generalized: Well developed, in no acute distress  Cardiology: normal rate and rhythm, no murmur noted Neurological examination  Mentation: Alert oriented to time, place, history taking. Follows all commands speech and language  fluent Cranial nerve II-XII: Pupils were equal round reactive to light. Extraocular movements were full, visual field were full on confrontational test. Facial sensation and strength were normal. Uvula tongue midline. Head turning and shoulder shrug  were normal and symmetric. Motor: The motor testing reveals 5 over 5 strength of all 4 extremities. Good symmetric motor tone is noted throughout. Resting tremor of left hand and forearm noted, no other tremor noted. No rigidity or bradykinesia.  Sensory: Sensory testing is intact to soft touch on all 4 extremities. No evidence of extinction is noted.  Coordination: Cerebellar testing reveals good finger-nose-finger and heel-to-shin bilaterally.  Gait and station: Gait is normal.  Reflexes: Deep tendon reflexes are symmetric and normal bilaterally.   DIAGNOSTIC DATA (LABS, IMAGING, TESTING) - I reviewed patient records, labs, notes, testing and imaging myself where available.  No flowsheet data found.   Lab Results  Component Value Date   WBC 7.1 02/13/2019   HGB 15.5 02/13/2019   HCT 45.0 02/13/2019   MCV 86 02/13/2019   PLT 332 02/13/2019      Component Value Date/Time   NA 141 02/13/2019 1452   K 4.5 02/13/2019 1452   CL 103 02/13/2019 1452   CO2 22 02/13/2019 1452   GLUCOSE 81 02/13/2019 1452   BUN 15 02/13/2019 1452   CREATININE 1.04 02/13/2019 1452   CALCIUM 9.5 02/13/2019 1452   PROT 6.8 02/13/2019 1452   ALBUMIN 4.5 02/13/2019 1452   AST 29 02/13/2019 1452   ALT 15 02/13/2019 1452   ALKPHOS 77 02/13/2019 1452   BILITOT 0.6 02/13/2019 1452   GFRNONAA 72 02/13/2019 1452   GFRAA 84 02/13/2019 1452   Lab Results  Component Value Date   CHOL 218 (H) 02/13/2019   HDL 61 02/13/2019   LDLCALC 135 (H) 02/13/2019   TRIG 126 02/13/2019   CHOLHDL 3.6 02/13/2019   No results found for: HGBA1C Lab Results  Component Value Date   [id number] 11/30/2017   Lab Results  Component Value Date  TSH 2.230 02/13/2019      ASSESSMENT AND PLAN 68 y.o. year old male  has a past medical history of Allergy, Asthma, Hyperlipemia, Hypertension, and Tremor. here with     ICD-10-CM   1. Resting tremor  G25.2   2. Cervical stenosis of spinal canal  M48.02   3. Bilateral carpal tunnel syndrome  G56.03     Chevez continues to have resting tremor of left hand and forearm with subjective weakness. He has not found benefit with increased doses of Sinemet and DaT scan negative. We will wean Sinemet by 1 tablet every 2 weeks. We have discussed other treatment options for tremor but will defer for now. Recent follow up with ortho shows concerns of multilevel foraminal narrowing, stenosis and nerve impingement of C5 and C7. No obvious myelopathy or sensory deficits noted on exam. NCS/EMG showed severe left CTS with involvement of median motor and sensory nerve. I will have him continue recommended follow up with hand surgeon as well as schedule follow up with Dr Leta Baptist for reevaluation. He and his daughter verbalize understanding and agreement with this plan.    No orders of the defined types were placed in this encounter.    No orders of the defined types were placed in this encounter.     I spent 45 minutes with the patient. 50% of this time was spent counseling and educating patient on plan of care and medications.    Debbora Presto, FNP-C 03/22/2019, 8:07 PM [facility] [address] [place], [place] [address] [phone]

## 2019-03-22 NOTE — Patient Instructions (Signed)
  We will wean you off Sinemet. Decrease by 1 tablet every 2 weeks. Please monitor symptoms closely for any worsening of temor, gait changes or slowing of movements  I will have Dr Leta Baptist take a look at MRI and NCS/EMG  Follow up in 3 months with Dr Leta Baptist    Tremor A tremor is trembling or shaking that you cannot control. Most tremors affect the hands or arms. Tremors can also affect the head, vocal cords, face, and other parts of the body. There are many types of tremors. Common types include:  Essential tremor. These usually occur in people older than 40. It may run in families and can happen in otherwise healthy people.  Resting tremor. These occur when the muscles are at rest, such as when your hands are resting in your lap. People with Parkinson's disease often have resting tremors.  Postural tremor. These occur when you try to hold a pose, such as keeping your hands outstretched.  Kinetic tremor. These occur during purposeful movement, such as trying to touch a finger to your nose.  Task-specific tremor. These may occur when you perform certain tasks such as writing, speaking, or standing.  Psychogenic tremor. These dramatically lessen or disappear when you are distracted. They can happen in people of all ages. Some types of tremors have no known cause. Tremors can also be a symptom of nervous system problems (neurological disorders) that may occur with aging. Some tremors go away with treatment, while others do not. Follow these instructions at home: Lifestyle      Limit alcohol intake to no more than 1 drink a day for nonpregnant women and 2 drinks a day for men. One drink equals 12 oz of beer, 5 oz of wine, or 1 oz of hard liquor.  Do not use any products that contain nicotine or tobacco, such as cigarettes and e-cigarettes. If you need help quitting, ask your health care provider.  Avoid extreme heat and extreme cold.  Limit your caffeine intake, as told by your  health care provider.  Try to get 8 hours of sleep each night.  Find ways to manage your stress, such as meditation or yoga. General instructions  Take over-the-counter and prescription medicines only as told by your health care provider.  Keep all follow-up visits as told by your health care provider. This is important. Contact a health care provider if you:  Develop a tremor after starting a new medicine.  Have a tremor along with other symptoms such as: ? Numbness. ? Tingling. ? Pain. ? Weakness.  Notice that your tremor gets worse.  Notice that your tremor interferes with your day-to-day life. Summary  A tremor is trembling or shaking that you cannot control.  Most tremors affect the hands or arms.  Some types of tremors have no known cause. Others may be a symptom of nervous system problems (neurological disorders).  Make sure you discuss any tremors you have with your health care provider. This information is not intended to replace advice given to you by your health care provider. Make sure you discuss any questions you have with your health care provider. Document Released: 04/10/2002 Document Revised: 04/02/2017 Document Reviewed: 02/18/2017 Elsevier Patient Education  2020 [employer].

## 2019-03-23 ENCOUNTER — Telehealth: Payer: Self-pay | Admitting: *Deleted

## 2019-03-23 NOTE — Telephone Encounter (Signed)
Received call from Opal Sidles, with GI concerning addendum on nuclear med DATSCAN. Report.  Dr. Leonia Reeves wanted [place] to be aware of addendum.  I will relay to both Dr. Leta Baptist and Debbora Presto, NP.

## 2019-03-23 NOTE — Progress Notes (Signed)
I reviewed note and agree with plan. I reviewed DATscan and I think findings are equivocal (not totally normal). Reviewed with Dr. Leonia Reeves who agrees.  Penni Bombard, MD [id number], [id number] PM Certified in Neurology, Neurophysiology and Neuroimaging  [facility] [address] [place], [place] [address] [phone]

## 2019-03-23 NOTE — Telephone Encounter (Signed)
Received and reviewed. TY.

## 2019-03-23 NOTE — Telephone Encounter (Signed)
Received from patient: EMG/NCS, MRI cervical spine, office notes form Dr Trenton Gammon. Given to A Lomax, NP, Dr Leta Baptist for review.

## 2019-03-23 NOTE — Telephone Encounter (Signed)
Reviewed addendum. Thank you. -VRP

## 2019-03-27 ENCOUNTER — Ambulatory Visit (INDEPENDENT_AMBULATORY_CARE_PROVIDER_SITE_OTHER): Payer: Medicare HMO | Admitting: Family Medicine

## 2019-03-27 ENCOUNTER — Encounter: Payer: Self-pay | Admitting: Family Medicine

## 2019-03-27 ENCOUNTER — Other Ambulatory Visit: Payer: Self-pay

## 2019-03-27 VITALS — BP 140/70 | HR 87 | Temp 98.2°F | Wt 187.4 lb

## 2019-03-27 DIAGNOSIS — K219 Gastro-esophageal reflux disease without esophagitis: Secondary | ICD-10-CM | POA: Diagnosis not present

## 2019-03-27 DIAGNOSIS — E785 Hyperlipidemia, unspecified: Secondary | ICD-10-CM

## 2019-03-27 DIAGNOSIS — R251 Tremor, unspecified: Secondary | ICD-10-CM

## 2019-03-27 DIAGNOSIS — Z23 Encounter for immunization: Secondary | ICD-10-CM | POA: Diagnosis not present

## 2019-03-27 MED ORDER — OMEPRAZOLE 20 MG PO CPDR: 20.0000 mg | DELAYED_RELEASE_CAPSULE | Freq: Every day | ORAL | 1 refills | Status: DC

## 2019-03-27 MED ORDER — ATORVASTATIN CALCIUM 10 MG PO TABS: 10.0000 mg | ORAL_TABLET | Freq: Every day | ORAL | 1 refills | Status: DC

## 2019-03-27 NOTE — Patient Instructions (Addendum)
   If you want to meet with other neurologist, let me know as I have some names.   Keep follow up with hand specialist, but let me know if a referral elsewhere needed.   Continue omeprazole once per day, and cholesterol med same dose for now.   Keep a record of your blood pressures outside of the office and if running over 140/90 - return to discuss changes.   Thanks for coming in today and stay safe.   If you have lab work done today you will be contacted with your lab results within the next 2 weeks.  If you have not heard from [place] then please contact us. The fastest way to get your results is to register for My Chart.   IF you received an x-ray today, you will receive an invoice from [facility]. Please contact [facility] at [phone] with questions or concerns regarding your invoice.   IF you received labwork today, you will receive an invoice from [place]. Please contact [facility] at [phone] with questions or concerns regarding your invoice.   Our billing staff will not be able to assist you with questions regarding bills from these companies.  You will be contacted with the lab results as soon as they are available. The fastest way to get your results is to activate your My Chart account. Instructions are located on the last page of this paperwork. If you have not heard from [place] regarding the results in 2 weeks, please contact this office.

## 2019-03-27 NOTE — Progress Notes (Signed)
Subjective:  Patient ID: Alan Ball, male    DOB: 02-25-1951  Age: 68 y.o. MRN: [id number]  CC:  Chief Complaint  Patient presents with   Medical Management of Chronic Issues    here for his  [id number] f/u     HPI Alan Ball presents for   Follow-up from physical and other concerns on October 12  Hyperlipidemia: Started on Lipitor 10 mg daily on 1012.  Small vessel disease noted on neuroimaging previously. No new side effects/myalgias.  Lab Results  Component Value Date   CHOL 218 (H) 02/13/2019   HDL 61 02/13/2019   LDLCALC 135 (H) 02/13/2019   TRIG 126 02/13/2019   CHOLHDL 3.6 02/13/2019   Lab Results  Component Value Date   ALT 15 02/13/2019   AST 29 02/13/2019   ALKPHOS 77 02/13/2019   BILITOT 0.6 02/13/2019   Tremor Followed by neurology, suspected parkinsonism.  Other testing was pending with DAT scan.  He was initially started on Sinemet as a trial.  DaTscan performed on November 12.  Overall findings indicate a potential decrease in the posterior striatum and right head of caudate nuclei.  Scan remained equivocal and recommended clinical correlation for Parkinson syndrome pathology. Had EMG/NCS - told was just severe carpal tunnel? appt with hand surgeon Alan Ball) 12/23 for 3 month follow up. Still has tremor - resting that comes and goes.  No change with sinemet, tapering off.  Plans on therapy Rx from Dr. Rolena Infante (cervical spine)  For left arm symptoms- not yet scheduled. Has been on home PT.   Gastroesophageal reflux: Avoidance of trigger foods discussed at last visit with handout given, start omeprazole once per day.  Also referred to gastroenterology to discuss symptoms as well as early satiety, need for repeat colonoscopy.  He was seen in follow-up by Dr. Silverio Decamp, with endoscopy and colonoscopy on November 9.  3 polyps removed, metaplasia without dysplasia.  Repeat 3 to 5 years. Endoscopy indicated short segment Barrett's, small hiatal hernia. He was  continued on omeprazole 20 mg daily.  Still on omeprazole daily working well - no breakthrough heartburn.    History Patient Active Problem List   Diagnosis Date Noted   Renal cyst, right 09/05/2015   Past Medical History:  Diagnosis Date   Allergy    Asthma    Hyperlipemia    Hypertension    Tremor    Past Surgical History:  Procedure Laterality Date   CARPAL TUNNEL RELEASE Bilateral 2019, 2020   HERNIA REPAIR Bilateral    inguinal   SHOULDER SURGERY     No Known Allergies Prior to Admission medications   Medication Sig Start Date End Date Taking? Authorizing Provider  albuterol (VENTOLIN HFA) 108 (90 Base) MCG/ACT inhaler Inhale 1-2 puffs into the lungs every 4 (four) hours as needed for wheezing or shortness of breath. 02/13/19  Yes [name], MD  amLODipine (NORVASC) 2.5 MG tablet Take 1 tablet (2.5 mg total) by mouth daily. 02/13/19  Yes [name], MD  atorvastatin (LIPITOR) 10 MG tablet Take 1 tablet (10 mg total) by mouth daily. 02/13/19  Yes [name], MD  carbidopa-levodopa (SINEMET IR) 25-100 MG tablet Take 1 tablet by mouth 4 (four) times daily. 01/31/19  Yes Lomax, Amy, NP  ibuprofen (ADVIL,MOTRIN) 200 MG tablet Take 200 mg by mouth every 6 (six) hours as needed for pain.   Yes [provider]  Multiple Vitamin (MULTIVITAMIN) tablet Take 1 tablet by mouth.  Yes [provider]  omeprazole (PRILOSEC) 20 MG capsule Take 1 capsule (20 mg total) by mouth daily. 02/13/19  Yes [name], MD  vitamin C (ASCORBIC ACID) 500 MG tablet Take 500 mg by mouth daily.   Yes [provider]   Social History   Socioeconomic History   Marital status: Married    Spouse name: Alan Ball   Number of children: Not on file   Years of education: Not on file   Highest education level: Bachelor's degree (e.g., BA, AB, BS)  Occupational History    Comment: retired  Scientist, product/process development strain: Not on file    Food insecurity    Worry: Not on file    Inability: Not on Lexicographer needs    Medical: Not on file    Non-medical: Not on file  Tobacco Use   Smoking status: Never Smoker   Smokeless tobacco: Never Used  Substance and Sexual Activity   Alcohol use: Yes    Comment: socially   Drug use: No   Sexual activity: Never  Lifestyle   Physical activity    Days per week: Not on file    Minutes per session: Not on file   Stress: Not on file  Relationships   Social connections    Talks on phone: Not on file    Gets together: Not on file    Attends religious service: Not on file    Active member of club or organization: Not on file    Attends meetings of clubs or organizations: Not on file    Relationship status: Not on file   Intimate partner violence    Fear of current or ex partner: Not on file    Emotionally abused: Not on file    Physically abused: Not on file    Forced sexual activity: Not on file  Other Topics Concern   Not on file  Social History Narrative   Lives with wife   Caffeine- coffee 3 cups daily, maybe a soda    Review of Systems Per HPI.  Objective:   Vitals:   03/27/19 1329 03/27/19 1331  BP: (!) 145/75 140/70  Pulse: 87   Temp: 98.2 F (36.8 C)   TempSrc: Oral   SpO2: 97%   Weight: 187 lb 6.4 oz (85 kg)     Physical Exam Vitals signs reviewed.  Constitutional:      Appearance: He is well-developed.  HENT:     Head: Normocephalic and atraumatic.  Eyes:     Pupils: Pupils are equal, round, and reactive to light.  Neck:     Vascular: No carotid bruit or JVD.  Cardiovascular:     Rate and Rhythm: Normal rate and regular rhythm.     Heart sounds: Normal heart sounds. No murmur.  Pulmonary:     Effort: Pulmonary effort is normal.     Breath sounds: Normal breath sounds. No rales.  Skin:    General: Skin is warm and dry.  Neurological:     Mental Status: He is alert and oriented to person, place, and time.       Assessment & Plan:  Alan Ball is a 68 y.o. male . Hyperlipidemia, unspecified hyperlipidemia type - Plan: Lipid Panel, Comprehensive metabolic panel, atorvastatin (LIPITOR) 10 MG tablet  -Tolerating Lipitor, continue same, check labs.  Tremor  -Status post work-up as above with equivocal DAT scan.  Plan for PT as above for possible cervical source,  has follow-up with hand specialist as well.  Option of second opinion discussed and can provide names if needed.  Gastroesophageal reflux disease, unspecified whether esophagitis present  -Improved, continue omeprazole same dose, trigger avoidance.  Need for shingles vaccine - Plan: Varicella-zoster vaccine IM (Shingrix) Sent to pharmacy.  No orders of the defined types were placed in this encounter.  Patient Instructions       If you have lab work done today you will be contacted with your lab results within the next 2 weeks.  If you have not heard from [place] then please contact us. The fastest way to get your results is to register for My Chart.   IF you received an x-ray today, you will receive an invoice from [facility]. Please contact [facility] Radiology at [phone] with questions or concerns regarding your invoice.   IF you received labwork today, you will receive an invoice from [place]. Please contact [facility] at [phone] with questions or concerns regarding your invoice.   Our billing staff will not be able to assist you with questions regarding bills from these companies.  You will be contacted with the lab results as soon as they are available. The fastest way to get your results is to activate your My Chart account. Instructions are located on the last page of this paperwork. If you have not heard from [place] regarding the results in 2 weeks, please contact this office.          Signed, [name], MD [facility]

## 2019-03-28 ENCOUNTER — Encounter: Payer: Self-pay | Admitting: Family Medicine

## 2019-03-28 LAB — LIPID PANEL
Chol/HDL Ratio: 2.9 ratio (ref 0.0–5.0)
Cholesterol, Total: 161 mg/dL (ref 100–199)
HDL: 55 mg/dL (ref >39)
LDL Chol Calc (NIH): 83 mg/dL (ref 0–99)
Triglycerides: 131 mg/dL (ref 0–149)
VLDL Cholesterol Cal: 23 mg/dL (ref 5–40)

## 2019-03-28 LAB — COMPREHENSIVE METABOLIC PANEL
ALT: 31 IU/L (ref 0–44)
AST: 29 IU/L (ref 0–40)
Albumin/Globulin Ratio: 2.4 — ABNORMAL HIGH (ref 1.2–2.2)
Albumin: 4.6 g/dL (ref 3.8–4.8)
Alkaline Phosphatase: 88 IU/L (ref 39–117)
BUN/Creatinine Ratio: 16 (ref 10–24)
BUN: 14 mg/dL (ref 8–27)
Bilirubin Total: 0.3 mg/dL (ref 0.0–1.2)
CO2: 23 mmol/L (ref 20–29)
Calcium: 9.4 mg/dL (ref 8.6–10.2)
Chloride: 105 mmol/L (ref 96–106)
Creatinine, Ser: 0.88 mg/dL (ref 0.76–1.27)
GFR calc Af Amer: 101 mL/min/{1.73_m2} (ref >59)
GFR calc non Af Amer: 87 mL/min/{1.73_m2} (ref >59)
Globulin, Total: 1.9 g/dL (ref 1.5–4.5)
Glucose: 88 mg/dL (ref 65–99)
Potassium: 4.6 mmol/L (ref 3.5–5.2)
Sodium: 140 mmol/L (ref 134–144)
Total Protein: 6.5 g/dL (ref 6.0–8.5)

## 2019-03-28 MED ORDER — SHINGRIX 50 MCG/0.5ML IM SUSR: 0.5000 mL | Freq: Once | INTRAMUSCULAR | 1 refills | Status: AC

## 2019-04-11 ENCOUNTER — Encounter: Payer: Self-pay | Admitting: Diagnostic Neuroimaging

## 2019-04-11 ENCOUNTER — Ambulatory Visit (INDEPENDENT_AMBULATORY_CARE_PROVIDER_SITE_OTHER): Payer: Medicare HMO | Admitting: Diagnostic Neuroimaging

## 2019-04-11 ENCOUNTER — Other Ambulatory Visit: Payer: Self-pay

## 2019-04-11 VITALS — BP 141/87 | HR 73 | Temp 98.0°F | Ht 67.0 in | Wt 188.0 lb

## 2019-04-11 DIAGNOSIS — G20A1 Parkinson's disease without dyskinesia, without mention of fluctuations: Secondary | ICD-10-CM

## 2019-04-11 DIAGNOSIS — G20 Parkinson's disease: Secondary | ICD-10-CM | POA: Diagnosis not present

## 2019-04-11 NOTE — Patient Instructions (Signed)
- (  not much benefit with carb/levo; continue to wean off)

## 2019-04-11 NOTE — Progress Notes (Signed)
[facility]  PATIENT: Alan Ball DOB: [birth year]  REFERRING CLINICIAN: Marzetta Ball HISTORY FROM: patient and wife  REASON FOR VISIT: follow up   HISTORICAL  CHIEF COMPLAINT:  Chief Complaint  Patient presents with  . Follow-up    Follow up resting tremor room Alan Ball and her temp is 98    HISTORY OF PRESENT ILLNESS:   UPDATE (04/11/19, VRP): Since last visit, had DaTscan which initially was reported normal, but upon further review likely is slightly abnormal, worse on the right than left basal ganglia.  He was tried on carbidopa levodopa 1 tablet 4 times a day which did not help.  Eventually this has been gradually weaned lower with no change in tremor.  He is planning to continue for the next 1 to 2 weeks before completely tapering off.  Continues to have some left arm resting tremor.  No gait or balance issues.    PRIOR HPI (12/06/18): 68 year old male here for evaluation of left hand tremor.  Symptoms started 4 months ago, gradual onset and progressive.  He notes resting tremor mainly in his left hand and arm.  This affects him randomly throughout the day.  Symptoms worsening over time.  He notes this when he is walking.  Sometimes his left wrist flexes.  No problem with right hand.  No problem with legs.  No balance or walking issues.  No change in smell or taste.  No vivid dreams or acting out during sleep.  No family history of tremor.   REVIEW OF SYSTEMS: Full 14 system review of systems performed and negative with exception of: As per HPI.   ALLERGIES: No Known Allergies  HOME MEDICATIONS: Outpatient Medications Prior to Visit  Medication Sig Dispense Refill  . albuterol (VENTOLIN HFA) 108 (90 Base) MCG/ACT inhaler Inhale 1-2 puffs into the lungs every 4 (four) hours as needed for wheezing or shortness of breath. 18 g 0  . amLODipine (NORVASC) 2.5 MG tablet Take 1 tablet (2.5 mg total) by mouth daily. 90 tablet 1  . atorvastatin (LIPITOR) 10 MG  tablet Take 1 tablet (10 mg total) by mouth daily. 90 tablet 1  . carbidopa-levodopa (SINEMET IR) 25-100 MG tablet Take 1 tablet by mouth 4 (four) times daily. (Patient taking differently: Take 1 tablet by mouth 4 (four) times daily. Patient is taking twice a day and titratng down) 120 tablet 5  . ibuprofen (ADVIL,MOTRIN) 200 MG tablet Take 200 mg by mouth every 6 (six) hours as needed for pain.    . Multiple Vitamin (MULTIVITAMIN) tablet Take 1 tablet by mouth.    Marland Kitchen omeprazole (PRILOSEC) 20 MG capsule Take 1 capsule (20 mg total) by mouth daily. 90 capsule 1  . oxyCODONE-acetaminophen (PERCOCET/ROXICET) 5-325 MG tablet     . vitamin C (ASCORBIC ACID) 500 MG tablet Take 500 mg by mouth daily.     No facility-administered medications prior to visit.     PAST MEDICAL HISTORY: Past Medical History:  Diagnosis Date  . Allergy   . Asthma   . Hyperlipemia   . Hypertension   . Tremor     PAST SURGICAL HISTORY: Past Surgical History:  Procedure Laterality Date  . CARPAL TUNNEL RELEASE Bilateral 2019, 2020  . HERNIA REPAIR Bilateral    inguinal  . SHOULDER SURGERY      FAMILY HISTORY: Family History  Problem Relation Age of Onset  . Hypertension Mother   . Thyroid disease Mother   . Cancer Father  brain tumor (gleoplastoma)  . Diabetes Father   . Colon polyps Father   . Colon cancer Neg Hx   . Stomach cancer Neg Hx   . Rectal cancer Neg Hx   . Esophageal cancer Neg Hx     SOCIAL HISTORY: Social History   Socioeconomic History  . Marital status: Married    Spouse name: Alan Ball  . Number of children: Not on file  . Years of education: Not on file  . Highest education level: Bachelor's degree (e.g., BA, AB, BS)  Occupational History    Comment: retired  Scientific laboratory technician  . Financial resource strain: Not on file  . Food insecurity    Worry: Not on file    Inability: Not on file  . Transportation needs    Medical: Not on file    Non-medical: Not on file  Tobacco Use   . Smoking status: Never Smoker  . Smokeless tobacco: Never Used  Substance and Sexual Activity  . Alcohol use: Yes    Comment: socially  . Drug use: No  . Sexual activity: Never  Lifestyle  . Physical activity    Days per week: Not on file    Minutes per session: Not on file  . Stress: Not on file  Relationships  . Social Herbalist on phone: Not on file    Gets together: Not on file    Attends religious service: Not on file    Active member of club or organization: Not on file    Attends meetings of clubs or organizations: Not on file    Relationship status: Not on file  . Intimate partner violence    Fear of current or ex partner: Not on file    Emotionally abused: Not on file    Physically abused: Not on file    Forced sexual activity: Not on file  Other Topics Concern  . Not on file  Social History Narrative   Lives with wife   Caffeine- coffee 3 cups daily, maybe a soda     PHYSICAL EXAM  GENERAL EXAM/CONSTITUTIONAL: Vitals:  Vitals:   04/11/19 1112  BP: (!) 141/87  Pulse: 73  Temp: 98 F (36.7 C)  Weight: 188 lb (85.3 kg)  Height: 5\' 7"  (1.702 m)   Body mass index is 29.44 kg/m. Wt Readings from Last 3 Encounters:  04/11/19 188 lb (85.3 kg)  03/27/19 187 lb 6.4 oz (85 kg)  03/22/19 185 lb (83.9 kg)    Patient is in no distress; well developed, nourished and groomed; neck is supple  CARDIOVASCULAR:  Examination of carotid arteries is normal; no carotid bruits  Regular rate and rhythm, no murmurs  Examination of peripheral vascular system by observation and palpation is normal  EYES:  Ophthalmoscopic exam of optic discs and posterior segments is normal; no papilledema or hemorrhages No exam data present  MUSCULOSKELETAL:  Gait, strength, tone, movements noted in Neurologic exam below  NEUROLOGIC: MENTAL STATUS:  No flowsheet data found.  awake, alert, oriented to person, place and time  recent and remote memory intact   normal attention and concentration  language fluent, comprehension intact, naming intact  fund of knowledge appropriate  CRANIAL NERVE:   2nd - no papilledema on fundoscopic exam  2nd, 3rd, 4th, 6th - pupils equal and reactive to light, visual fields full to confrontation, extraocular muscles intact, no nystagmus  5th - facial sensation symmetric  7th - facial strength symmetric  8th - hearing intact  9th - palate elevates symmetrically, uvula midline  11th - shoulder shrug symmetric  12th - tongue protrusion midline  MOTOR:   RESTING TREMOR IN LEFT UPPER EXT; MILD POSTURAL AND ACTION TREMOR IN LUE  MILD COGWHEELING IN LEFT UPPER EXT  MILD BRADYKINESIA IN LEFT HAND FINGER TAP  normal bulk and tone, full strength in the BUE, BLE  SENSORY:   normal and symmetric to light touch, temperature, vibration  COORDINATION:   finger-nose-finger, fine finger movements normal  REFLEXES:   deep tendon reflexes TRACE and symmetric  GAIT/STATION:   narrow based gait; LEFT HAND TREMOR WITH WALKING    DIAGNOSTIC DATA (LABS, IMAGING, TESTING) - I reviewed patient records, labs, notes, testing and imaging myself where available.  Lab Results  Component Value Date   WBC 7.1 02/13/2019   HGB 15.5 02/13/2019   HCT 45.0 02/13/2019   MCV 86 02/13/2019   PLT 332 02/13/2019      Component Value Date/Time   NA 140 03/27/2019 1437   K 4.6 03/27/2019 1437   CL 105 03/27/2019 1437   CO2 23 03/27/2019 1437   GLUCOSE 88 03/27/2019 1437   BUN 14 03/27/2019 1437   CREATININE 0.88 03/27/2019 1437   CALCIUM 9.4 03/27/2019 1437   PROT 6.5 03/27/2019 1437   ALBUMIN 4.6 03/27/2019 1437   AST 29 03/27/2019 1437   ALT 31 03/27/2019 1437   ALKPHOS 88 03/27/2019 1437   BILITOT 0.3 03/27/2019 1437   GFRNONAA 87 03/27/2019 1437   GFRAA 101 03/27/2019 1437   Lab Results  Component Value Date   CHOL 161 03/27/2019   HDL 55 03/27/2019   LDLCALC 83 03/27/2019   TRIG 131  03/27/2019   CHOLHDL 2.9 03/27/2019   No results found for: HGBA1C Lab Results  Component Value Date   [id number] 11/30/2017   Lab Results  Component Value Date   TSH 2.230 02/13/2019    01/03/19 MRI brain (without) [I reviewed images myself and agree with interpretation. -VRP]  - Mild chronic small vessel ischemic disease. - Chronic right maxillary sinusitis.  - No acute findings.  03/16/19 DATscan [I reviewed images myself and agree with interpretation. -VRP]  - In re examination of daTscan and in discussion with Dr. Leta Baptist addendum as follows: - While there is robust activity within the heads of the caudate nuclei, the RIGHT head of caudate nuclei has slightly less relative radiotracer activity than the LEFT. Additionally there may symmetric truncation of the posterior margins of the LEFT and RIGHT putamen. Again RIGHT slightly greater than LEFT.  - Overall findings indicate potential decrease in the posterior striata and RIGHT head of caudate nuclei. The scan remains EQUIVOCAL and recommend clinical correlation for Parkinson's syndrome pathology. A repeat Ioflupane scan (DaTscan) may be beneficial in the future.  02/11/19 EMG/NCS - severe left carpal tunnel syndrome - no plexopathy or radiculopathy   ASSESSMENT AND PLAN  68 y.o. year old male here with new onset resting tremor in left upper extremity with mild cogwheel rigidity in 2020.  Remainder of exam unremarkable.  Could represent idiopathic Parkinson's disease.  Dx: parkinson's disease  1. Parkinson's disease ([place])      PLAN:  LEFT ARM RESTING TREMOR (likely idiopathic parkinson's disease) - (not much benefit with carb/levo; continue to wean off) - may consider amantadine or rasagiline in future - monitor symptoms; optimize exercise and nutrition  Return in about 6 months (around 10/10/2019).    Penni Bombard, MD [id number], [id number] AM Certified in Neurology,  Neurophysiology and Neuroimaging   [facility] [address] [place], [place] [address] [phone]

## 2019-04-25 DIAGNOSIS — G5603 Carpal tunnel syndrome, bilateral upper limbs: Secondary | ICD-10-CM | POA: Diagnosis not present

## 2019-05-15 DIAGNOSIS — M5412 Radiculopathy, cervical region: Secondary | ICD-10-CM | POA: Diagnosis not present

## 2019-05-15 DIAGNOSIS — M5030 Other cervical disc degeneration, unspecified cervical region: Secondary | ICD-10-CM | POA: Diagnosis not present

## 2019-05-24 DIAGNOSIS — M5412 Radiculopathy, cervical region: Secondary | ICD-10-CM | POA: Diagnosis not present

## 2019-05-30 ENCOUNTER — Telehealth: Payer: Self-pay | Admitting: *Deleted

## 2019-05-30 NOTE — Telephone Encounter (Signed)
Called wife, on [place] and asked her if patient needed FU scheduled on Feb 17th since he was seen in Dec 2020. She stated that he doesn't need to be seen, will keep 6 month FU in June. She verbalized understanding, appreciation.

## 2019-05-31 DIAGNOSIS — M5412 Radiculopathy, cervical region: Secondary | ICD-10-CM | POA: Diagnosis not present

## 2019-06-02 DIAGNOSIS — M5412 Radiculopathy, cervical region: Secondary | ICD-10-CM | POA: Diagnosis not present

## 2019-06-06 DIAGNOSIS — Z0101 Encounter for examination of eyes and vision with abnormal findings: Secondary | ICD-10-CM | POA: Diagnosis not present

## 2019-06-08 DIAGNOSIS — M5412 Radiculopathy, cervical region: Secondary | ICD-10-CM | POA: Diagnosis not present

## 2019-06-15 DIAGNOSIS — M5412 Radiculopathy, cervical region: Secondary | ICD-10-CM | POA: Diagnosis not present

## 2019-06-21 ENCOUNTER — Ambulatory Visit: Payer: Medicare HMO | Admitting: Diagnostic Neuroimaging

## 2019-06-23 DIAGNOSIS — M5412 Radiculopathy, cervical region: Secondary | ICD-10-CM | POA: Diagnosis not present

## 2019-06-26 DIAGNOSIS — M5412 Radiculopathy, cervical region: Secondary | ICD-10-CM | POA: Diagnosis not present

## 2019-06-29 DIAGNOSIS — M5412 Radiculopathy, cervical region: Secondary | ICD-10-CM | POA: Diagnosis not present

## 2019-08-08 ENCOUNTER — Other Ambulatory Visit: Payer: Self-pay | Admitting: Family Medicine

## 2019-08-08 DIAGNOSIS — I10 Essential (primary) hypertension: Secondary | ICD-10-CM

## 2019-09-05 ENCOUNTER — Encounter: Payer: Self-pay | Admitting: Family Medicine

## 2019-09-25 ENCOUNTER — Other Ambulatory Visit: Payer: Self-pay

## 2019-09-25 ENCOUNTER — Ambulatory Visit (INDEPENDENT_AMBULATORY_CARE_PROVIDER_SITE_OTHER): Payer: Medicare HMO | Admitting: Family Medicine

## 2019-09-25 VITALS — BP 144/88 | HR 81 | Temp 98.1°F | Resp 15 | Ht 67.0 in | Wt 188.8 lb

## 2019-09-25 DIAGNOSIS — K219 Gastro-esophageal reflux disease without esophagitis: Secondary | ICD-10-CM

## 2019-09-25 DIAGNOSIS — L729 Follicular cyst of the skin and subcutaneous tissue, unspecified: Secondary | ICD-10-CM

## 2019-09-25 DIAGNOSIS — R251 Tremor, unspecified: Secondary | ICD-10-CM | POA: Diagnosis not present

## 2019-09-25 DIAGNOSIS — E785 Hyperlipidemia, unspecified: Secondary | ICD-10-CM | POA: Diagnosis not present

## 2019-09-25 DIAGNOSIS — I10 Essential (primary) hypertension: Secondary | ICD-10-CM

## 2019-09-25 MED ORDER — ATORVASTATIN CALCIUM 10 MG PO TABS: 10.0000 mg | ORAL_TABLET | Freq: Every day | ORAL | 1 refills | Status: DC

## 2019-09-25 MED ORDER — OMEPRAZOLE 20 MG PO CPDR: 20.0000 mg | DELAYED_RELEASE_CAPSULE | Freq: Every day | ORAL | 1 refills | Status: DC

## 2019-09-25 MED ORDER — AMLODIPINE BESYLATE 2.5 MG PO TABS: 2.5000 mg | ORAL_TABLET | Freq: Every day | ORAL | 1 refills | Status: DC

## 2019-09-25 NOTE — Patient Instructions (Addendum)
You can try 2 of the amlodipine each day for better blood pressure. Keep a record of your blood pressures outside of the office.  If any new side effects, let me know.   No other med changes for now.  I will refer you to surgery for the cyst, and neurologist for the tremor.   Thanks for coming in today.    If you have lab work done today you will be contacted with your lab results within the next 2 weeks.  If you have not heard from [place] then please contact us. The fastest way to get your results is to register for My Chart.   IF you received an x-ray today, you will receive an invoice from [facility]iology. Please contact [facility] Radiology at [phone] with questions or concerns regarding your invoice.   IF you received labwork today, you will receive an invoice from [place]. Please contact [facility] at [phone] with questions or concerns regarding your invoice.   Our billing staff will not be able to assist you with questions regarding bills from these companies.  You will be contacted with the lab results as soon as they are available. The fastest way to get your results is to activate your My Chart account. Instructions are located on the last page of this paperwork. If you have not heard from [place] regarding the results in 2 weeks, please contact this office.

## 2019-09-25 NOTE — Progress Notes (Signed)
Subjective:  Patient ID: Alan Ball, male    DOB: April 17, 1951  Age: 69 y.o. MRN: [id number]  CC:  Chief Complaint  Patient presents with  . Tremors    pt following up on Lt arm tremor, pt notes about the same from last visit. when resting this is worse, when working with his hands he can keep it still  . Hyperlipidemia    pt also following up on previous lab work done in november     HPI LABRYANT CHALIFOUX presents for   Hand tremors: See previous notes. Neurology note April 11, 2019.  DaTscan initially reported normal but upon further review slightly abnormal worse on the right than left basal ganglia.  Previously tried carbidopa levodopa 4 times a day which did not help.  Persistent left arm resting tremor.  Thought to have idiopathic Parkinson's.  Was weaned off carbidopa levodopa, monitor symptoms, consider amantadine or rasagiline in the future.  Optimization of exercise and nutrition with symptom monitoring recommended with 65-month follow-up. He had also been treated for cervical radiculopathy, including PT. Min change - eased tremor some.   He would like to meet with different neurologist second opinion.  Still with tremor. Comes and goes. Not affecting activities. Still some tight neck - able to calm tremor with stretching neck ortho appt - Dr. Rolena Infante appt tomorrow.   Hyperlipidemia: lipitor 10mg  qd.  Not fasting at this time.  No new myalgias. Occasional charlie horse- better with banana and water.   Walking 2 miles per day, watching diet.  Lab Results  Component Value Date   CHOL 155 09/25/2019   HDL 58 09/25/2019   LDLCALC 82 09/25/2019   TRIG 81 09/25/2019   CHOLHDL 2.7 09/25/2019   Lab Results  Component Value Date   ALT 26 09/25/2019   AST 35 09/25/2019   ALKPHOS 88 09/25/2019   BILITOT 0.6 09/25/2019   GERD:  Taking prilosec most days - working well.     Hypertension: amlodipine 2.5mg  qd.  No ankle swelling.  Home readings:130-140/78., 150's in am.   BP Readings from Last 3 Encounters:  09/25/19 (!) 144/88  04/11/19 (!) 141/87  03/27/19 140/70   Lab Results  Component Value Date   CREATININE 0.90 09/25/2019   Cyst on left buttock: For years - more sore, no redness/discharge.  Sore to sit on area - would like to have removed.   History Patient Active Problem List   Diagnosis Date Noted  . 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 Allergy   . Asthma   . Hyperlipemia   . Hypertension   . Tremor    Past Surgical History:  Procedure Laterality Date  . CARPAL TUNNEL RELEASE Bilateral 2019, 2020  . HERNIA REPAIR Bilateral    inguinal  . SHOULDER SURGERY     No Known Allergies Prior to Admission medications   Medication Sig Start Date End Date Taking? Authorizing Provider  albuterol (VENTOLIN HFA) 108 (90 Base) MCG/ACT inhaler Inhale 1-2 puffs into the lungs every 4 (four) hours as needed for wheezing or shortness of breath. 02/13/19  Yes [name], MD  amLODipine (NORVASC) 2.5 MG tablet TAKE ONE TABLET BY MOUTH ONE TIME DAILY  08/08/19  Yes [name], MD  atorvastatin (LIPITOR) 10 MG tablet Take 1 tablet (10 mg total) by mouth daily. 03/27/19  Yes [name], MD  ibuprofen (ADVIL,MOTRIN) 200 MG tablet Take 200 mg by mouth every  6 (six) hours as needed for pain.   Yes [provider]  Multiple Vitamin (MULTIVITAMIN) tablet Take 1 tablet by mouth.   Yes [provider]  omeprazole (PRILOSEC) 20 MG capsule Take 1 capsule (20 mg total) by mouth daily. 03/27/19  Yes [name], MD  vitamin C (ASCORBIC ACID) 500 MG tablet Take 500 mg by mouth daily.   Yes [provider]  carbidopa-levodopa (SINEMET IR) 25-100 MG tablet Take 1 tablet by mouth 4 (four) times daily. Patient not taking: Reported on 09/25/2019 01/31/19   Debbora Presto, NP  oxyCODONE-acetaminophen (PERCOCET/ROXICET) 5-325 MG tablet  10/26/18   [provider]   Social History    Socioeconomic History  . Marital status: Married    Spouse name: Romie Minus  . Number of children: Not on file  . Years of education: Not on file  . Highest education level: Bachelor's degree (e.g., BA, AB, BS)  Occupational History    Comment: retired  Tobacco Use  . Smoking status: Never Smoker  . Smokeless tobacco: Never Used  Substance and Sexual Activity  . Alcohol use: Yes    Comment: socially  . Drug use: No  . Sexual activity: Never  Other Topics Concern  . Not on file  Social History Narrative   Lives with wife   Caffeine- coffee 3 cups daily, maybe a soda   Social Determinants of Health   Financial Resource Strain:   . Difficulty of Paying Living Expenses:   Food Insecurity:   . Worried About Charity fundraiser in the Last Year:   . Arboriculturist in the Last Year:   Transportation Needs:   . Film/video editor (Medical):   Marland Kitchen Lack of Transportation (Non-Medical):   Physical Activity:   . Days of Exercise per Week:   . Minutes of Exercise per Session:   Stress:   . Feeling of Stress :   Social Connections:   . Frequency of Communication with Friends and Family:   . Frequency of Social Gatherings with Friends and Family:   . Attends Religious Services:   . Active Member of Clubs or Organizations:   . Attends Archivist Meetings:   Marland Kitchen Marital Status:   Intimate Partner Violence:   . Fear of Current or Ex-Partner:   . Emotionally Abused:   Marland Kitchen Physically Abused:   . Sexually Abused:     Review of Systems  Per HPI.  Objective:   Vitals:   09/25/19 1309 09/25/19 1313  BP: (!) 161/87 (!) 144/88  Pulse: 81   Resp: 15   Temp: 98.1 F (36.7 C)   TempSrc: Temporal   SpO2: 95%   Weight: 188 lb 12.8 oz (85.6 kg)   Height: 5\' 7"  (1.702 m)      Physical Exam Vitals reviewed.  Constitutional:      Appearance: He is well-developed.  HENT:     Head: Normocephalic and atraumatic.  Eyes:     Pupils: Pupils are equal, round, and reactive to  light.  Neck:     Vascular: No carotid bruit or JVD.  Cardiovascular:     Rate and Rhythm: Normal rate and regular rhythm.     Heart sounds: Normal heart sounds. No murmur.  Pulmonary:     Effort: Pulmonary effort is normal.     Breath sounds: Normal breath sounds. No rales.  Skin:    General: Skin is warm and dry.     Comments: Left mid buttocks  cystic/firm structure, no surrounding erythema or tenderness palpation.  Neurological:     Mental Status: He is alert and oriented to person, place, and time.     Comments: Resting greater than intention tremor of left hand, primarily thumb.  Psychiatric:        Mood and Affect: Mood normal.        Behavior: Behavior normal.        Assessment & Plan:  HANIF YANNUZZI is a 69 y.o. male . Hyperlipidemia, unspecified hyperlipidemia type - Plan: Comprehensive metabolic panel, Lipid panel, atorvastatin (LIPITOR) 10 MG tablet  - The current medical regimen is effective;  continue present plan and medications.  Essential hypertension - Plan: amLODipine (NORVASC) 2.5 MG tablet  -Borderline control, will have him try amlodipine 5 mg total daily with 2 of his 2.5's, call with update.  Tremor - Plan: Ambulatory referral to Neurology  -Persistent left hand tremor.  Minimal change with PT.  DaTscan and previous neurology eval suspicious for Parkinson's.  Would like second opinion, refer to [facility] neurology.  Cyst of buttocks - Plan: Ambulatory referral to General Surgery  -No sign of infection, but due to discomfort medication will have him meet with general surgery for excision.  Gastroesophageal reflux disease, unspecified whether esophagitis present - Plan: omeprazole (PRILOSEC) 20 MG capsule  -Stable, continue same  Meds ordered this encounter  Medications  . amLODipine (NORVASC) 2.5 MG tablet    Sig: Take 1 tablet (2.5 mg total) by mouth daily.    Dispense:  90 tablet    Refill:  1  . atorvastatin (LIPITOR) 10 MG tablet    Sig: Take 1  tablet (10 mg total) by mouth daily.    Dispense:  90 tablet    Refill:  1  . omeprazole (PRILOSEC) 20 MG capsule    Sig: Take 1 capsule (20 mg total) by mouth daily.    Dispense:  90 capsule    Refill:  1   Patient Instructions   You can try 2 of the amlodipine each day for better blood pressure. Keep a record of your blood pressures outside of the office.  If any new side effects, let me know.   No other med changes for now.  I will refer you to surgery for the cyst, and neurologist for the tremor.   Thanks for coming in today.    If you have lab work done today you will be contacted with your lab results within the next 2 weeks.  If you have not heard from [place] then please contact us. The fastest way to get your results is to register for My Chart.   IF you received an x-ray today, you will receive an invoice from [facility]. Please contact [facility] at [phone] with questions or concerns regarding your invoice.   IF you received labwork today, you will receive an invoice from [facility]. Please contact [facility] at [phone] with questions or concerns regarding your invoice.   Our billing staff will not be able to assist you with questions regarding bills from these companies.  You will be contacted with the lab results as soon as they are available. The fastest way to get your results is to activate your My Chart account. Instructions are located on the last page of this paperwork. If you have not heard from [place] regarding the results in 2 weeks, please contact this office.         Signed, [name], MD [facility]

## 2019-09-26 ENCOUNTER — Encounter: Payer: Self-pay | Admitting: Family Medicine

## 2019-09-26 DIAGNOSIS — M5412 Radiculopathy, cervical region: Secondary | ICD-10-CM | POA: Diagnosis not present

## 2019-09-26 DIAGNOSIS — M5030 Other cervical disc degeneration, unspecified cervical region: Secondary | ICD-10-CM | POA: Diagnosis not present

## 2019-09-26 DIAGNOSIS — M542 Cervicalgia: Secondary | ICD-10-CM | POA: Diagnosis not present

## 2019-09-26 LAB — COMPREHENSIVE METABOLIC PANEL
ALT: 26 IU/L (ref 0–44)
AST: 35 IU/L (ref 0–40)
Albumin/Globulin Ratio: 1.9 (ref 1.2–2.2)
Albumin: 4.5 g/dL (ref 3.8–4.8)
Alkaline Phosphatase: 88 IU/L (ref 48–121)
BUN/Creatinine Ratio: 14 (ref 10–24)
BUN: 13 mg/dL (ref 8–27)
Bilirubin Total: 0.6 mg/dL (ref 0.0–1.2)
CO2: 21 mmol/L (ref 20–29)
Calcium: 9.2 mg/dL (ref 8.6–10.2)
Chloride: 105 mmol/L (ref 96–106)
Creatinine, Ser: 0.9 mg/dL (ref 0.76–1.27)
GFR calc Af Amer: 100 mL/min/{1.73_m2} (ref >59)
GFR calc non Af Amer: 86 mL/min/{1.73_m2} (ref >59)
Globulin, Total: 2.4 g/dL (ref 1.5–4.5)
Glucose: 95 mg/dL (ref 65–99)
Potassium: 4.2 mmol/L (ref 3.5–5.2)
Sodium: 141 mmol/L (ref 134–144)
Total Protein: 6.9 g/dL (ref 6.0–8.5)

## 2019-09-26 LAB — LIPID PANEL
Chol/HDL Ratio: 2.7 ratio (ref 0.0–5.0)
Cholesterol, Total: 155 mg/dL (ref 100–199)
HDL: 58 mg/dL (ref >39)
LDL Chol Calc (NIH): 82 mg/dL (ref 0–99)
Triglycerides: 81 mg/dL (ref 0–149)
VLDL Cholesterol Cal: 15 mg/dL (ref 5–40)

## 2019-09-28 DIAGNOSIS — R251 Tremor, unspecified: Secondary | ICD-10-CM | POA: Diagnosis not present

## 2019-09-28 DIAGNOSIS — M4802 Spinal stenosis, cervical region: Secondary | ICD-10-CM | POA: Diagnosis not present

## 2019-09-28 DIAGNOSIS — G20 Parkinson's disease: Secondary | ICD-10-CM | POA: Diagnosis not present

## 2019-09-28 DIAGNOSIS — R258 Other abnormal involuntary movements: Secondary | ICD-10-CM | POA: Diagnosis not present

## 2019-09-28 DIAGNOSIS — G5600 Carpal tunnel syndrome, unspecified upper limb: Secondary | ICD-10-CM | POA: Diagnosis not present

## 2019-10-05 DIAGNOSIS — M5030 Other cervical disc degeneration, unspecified cervical region: Secondary | ICD-10-CM | POA: Diagnosis not present

## 2019-10-10 ENCOUNTER — Ambulatory Visit: Payer: Medicare HMO | Admitting: Diagnostic Neuroimaging

## 2019-10-11 ENCOUNTER — Telehealth: Payer: Self-pay | Admitting: Family Medicine

## 2019-10-11 ENCOUNTER — Other Ambulatory Visit: Payer: Self-pay

## 2019-10-11 DIAGNOSIS — I10 Essential (primary) hypertension: Secondary | ICD-10-CM

## 2019-10-11 DIAGNOSIS — G20 Parkinson's disease: Secondary | ICD-10-CM | POA: Insufficient documentation

## 2019-10-11 MED ORDER — AMLODIPINE BESYLATE 2.5 MG PO TABS: 2.5000 mg | ORAL_TABLET | Freq: Every day | ORAL | 1 refills | Status: DC

## 2019-10-11 NOTE — Telephone Encounter (Signed)
Medication Refill - Medication: amlodipine   Has the patient contacted their pharmacy? Yes.   Pt states that PCP increased his dose to 5 mg. Please advise . (Agent: If no, request that the patient contact the pharmacy for the refill.) (Agent: If yes, when and what did the pharmacy advise?)  Preferred Pharmacy (with phone number or street name):  [facility] [id number], [place]  [address] [place] [address]  Phone: [phone] Fax: [phone]  Not a 24 hour pharmacy; exact hours not known.     Agent: Please be advised that RX refills may take up to 3 business days. We ask that you follow-up with your pharmacy.

## 2019-10-24 DIAGNOSIS — M65331 Trigger finger, right middle finger: Secondary | ICD-10-CM | POA: Diagnosis not present

## 2019-10-24 DIAGNOSIS — G5603 Carpal tunnel syndrome, bilateral upper limbs: Secondary | ICD-10-CM | POA: Diagnosis not present

## 2019-10-24 DIAGNOSIS — M654 Radial styloid tenosynovitis [de Quervain]: Secondary | ICD-10-CM | POA: Diagnosis not present

## 2019-10-24 DIAGNOSIS — M65351 Trigger finger, right little finger: Secondary | ICD-10-CM | POA: Diagnosis not present

## 2019-10-24 DIAGNOSIS — G5601 Carpal tunnel syndrome, right upper limb: Secondary | ICD-10-CM | POA: Diagnosis not present

## 2019-10-25 DIAGNOSIS — M5412 Radiculopathy, cervical region: Secondary | ICD-10-CM | POA: Diagnosis not present

## 2019-10-31 DIAGNOSIS — G20 Parkinson's disease: Secondary | ICD-10-CM | POA: Diagnosis not present

## 2019-10-31 DIAGNOSIS — Z79899 Other long term (current) drug therapy: Secondary | ICD-10-CM | POA: Diagnosis not present

## 2019-11-02 DIAGNOSIS — M5412 Radiculopathy, cervical region: Secondary | ICD-10-CM | POA: Diagnosis not present

## 2019-11-09 DIAGNOSIS — M5412 Radiculopathy, cervical region: Secondary | ICD-10-CM | POA: Diagnosis not present

## 2019-11-14 ENCOUNTER — Encounter: Payer: Self-pay | Admitting: Family Medicine

## 2019-11-20 DIAGNOSIS — M5412 Radiculopathy, cervical region: Secondary | ICD-10-CM | POA: Diagnosis not present

## 2019-11-24 DIAGNOSIS — M5030 Other cervical disc degeneration, unspecified cervical region: Secondary | ICD-10-CM | POA: Diagnosis not present

## 2019-11-24 DIAGNOSIS — M5412 Radiculopathy, cervical region: Secondary | ICD-10-CM | POA: Diagnosis not present

## 2019-12-01 DIAGNOSIS — M5412 Radiculopathy, cervical region: Secondary | ICD-10-CM | POA: Diagnosis not present

## 2019-12-11 ENCOUNTER — Telehealth: Payer: Self-pay

## 2019-12-11 MED ORDER — AMLODIPINE BESYLATE 5 MG PO TABS
5.0000 mg | ORAL_TABLET | Freq: Every day | ORAL | 1 refills | Status: DC
Start: 2019-12-11 — End: 2020-05-06

## 2019-12-11 NOTE — Telephone Encounter (Signed)
Pt. Called asserting that Dr. Carlota Raspberry had asked him to start taking the amLODipine prescribed to him twice daily. Pt. Reports that he has, which has helped lower his BP, but has caused him to run out of the medication early with the pharmacy being unwilling to refill it before the next fill date.   Pt. Requests Dr. Carlota Raspberry alter the script such that he can pick up the medication when he runs out of it (While taking it in compliance).

## 2019-12-11 NOTE — Telephone Encounter (Signed)
Called pt and spoke with him. I have informed him that since he is taking both tabs in the am I am just going to send in a 5 mg of the Amlodipine and that will be one less pill he has to take.   Pt stated understanding. And will make his 6 month f/u at a later time.

## 2019-12-21 DIAGNOSIS — Z03818 Encounter for observation for suspected exposure to other biological agents ruled out: Secondary | ICD-10-CM | POA: Diagnosis not present

## 2019-12-21 DIAGNOSIS — Z20822 Contact with and (suspected) exposure to covid-19: Secondary | ICD-10-CM | POA: Diagnosis not present

## 2020-01-10 ENCOUNTER — Other Ambulatory Visit: Payer: Self-pay

## 2020-01-10 ENCOUNTER — Encounter: Payer: Self-pay | Admitting: Family Medicine

## 2020-01-10 DIAGNOSIS — J9801 Acute bronchospasm: Secondary | ICD-10-CM

## 2020-01-10 MED ORDER — ALBUTEROL SULFATE HFA 108 (90 BASE) MCG/ACT IN AERS
1.0000 | INHALATION_SPRAY | RESPIRATORY_TRACT | 0 refills | Status: DC | PRN
Start: 2020-01-10 — End: 2021-07-16

## 2020-02-01 DIAGNOSIS — Z79899 Other long term (current) drug therapy: Secondary | ICD-10-CM | POA: Diagnosis not present

## 2020-02-01 DIAGNOSIS — G20 Parkinson's disease: Secondary | ICD-10-CM | POA: Diagnosis not present

## 2020-03-04 ENCOUNTER — Ambulatory Visit: Payer: Medicare HMO

## 2020-03-27 ENCOUNTER — Ambulatory Visit: Payer: Medicare HMO

## 2020-03-27 ENCOUNTER — Encounter: Payer: Self-pay | Admitting: Family Medicine

## 2020-04-01 ENCOUNTER — Telehealth: Payer: Self-pay | Admitting: Family Medicine

## 2020-04-01 ENCOUNTER — Ambulatory Visit (INDEPENDENT_AMBULATORY_CARE_PROVIDER_SITE_OTHER): Payer: Medicare HMO | Admitting: Family Medicine

## 2020-04-01 ENCOUNTER — Other Ambulatory Visit: Payer: Self-pay

## 2020-04-01 DIAGNOSIS — Z23 Encounter for immunization: Secondary | ICD-10-CM

## 2020-04-01 NOTE — Progress Notes (Signed)
HD flu shot tolerated well.

## 2020-04-01 NOTE — Telephone Encounter (Signed)
Pt is wanting to reschedule his AWV. Please advise at [phone].

## 2020-04-04 ENCOUNTER — Ambulatory Visit (INDEPENDENT_AMBULATORY_CARE_PROVIDER_SITE_OTHER): Payer: Medicare HMO | Admitting: Family Medicine

## 2020-04-04 ENCOUNTER — Telehealth: Payer: Self-pay | Admitting: *Deleted

## 2020-04-04 VITALS — BP 144/88 | Ht 67.0 in | Wt 182.0 lb

## 2020-04-04 DIAGNOSIS — Z0000 Encounter for general adult medical examination without abnormal findings: Secondary | ICD-10-CM

## 2020-04-04 NOTE — Telephone Encounter (Signed)
Please call Mr. Netto to schedule a 6 th month follow up medication February.   Thank You

## 2020-04-04 NOTE — Telephone Encounter (Signed)
Called pt and sch appt for 06/05/20

## 2020-04-04 NOTE — Progress Notes (Signed)
Presents today for [employer] Visit   Date of last exam: 09-25-2019  Interpreter used for this visit?no  I connected with  Alan Ball on 04/04/20 by a telephone application and verified that I am speaking with the correct person using two identifiers.   I discussed the limitations of evaluation and management by telemedicine. The patient expressed understanding and agreed to proceed.  Patient location: home  Provider location: in office  I provided 20 minutes of non face - to - face time during this encounter.   Patient Care Team: [name], MD as PCP - General (Family Medicine)   Other items to address today:  Discussed Eye/Dental Discussed Immunizations Patient never saw [facility] for Cyst Number given patient will call if any problems with referral.  Patient will call for 6 month follow up due in Feb.   Other Screening: Last screening for diabetes: 09-25-2019 Last lipid screening: 09-25-2019  ADVANCE DIRECTIVES: Discussed: yes On File: yes Materials Provided: no  Immunization status:  Immunization History  Administered Date(s) Administered  . Fluad Quad(high Dose 65+) 02/13/2019, 04/01/2020  . Influenza, High Dose Seasonal PF 02/17/2018  . Influenza,inj,Quad PF,6+ Mos 12/25/2013, 05/11/2016, 04/19/2017  . Pneumococcal Conjugate-13 12/25/2013  . Pneumococcal Polysaccharide-23 03/16/2014  . Tdap 09/08/2012  . Zoster Recombinat (Shingrix) 04/10/2019, 07/27/2019     Health Maintenance Due  Topic Date Due  . COVID-19 Vaccine (1) Never done     Functional Status Survey: Is the patient deaf or have difficulty hearing?: No Does the patient have difficulty seeing, even when wearing glasses/contacts?: No Does the patient have difficulty concentrating, remembering, or making decisions?: No Does the patient have difficulty walking or climbing stairs?: No Does the patient have difficulty dressing or bathing?: No Does the  patient have difficulty doing errands alone such as visiting a doctor's office or shopping?: No   6CIT Screen 04/04/2020 02/13/2019 08/25/2018  What Year? 0 points 0 points 0 points  What month? 0 points 0 points 0 points  What time? 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 Parkinsonism ([place]) 10/11/2019  . Cervical radiculopathy 02/21/2019  . DDD (degenerative disc disease), cervical 02/21/2019  . Bilateral carpal tunnel syndrome 05/19/2018  . Renal cyst, right 09/05/2015     Past Medical History:  Diagnosis Date  . Allergy   . Asthma   . Hyperlipemia   . Hypertension   . Tremor      Past Surgical History:  Procedure Laterality Date  . CARPAL TUNNEL RELEASE Bilateral 2019, 2020  . HERNIA REPAIR Bilateral    inguinal  . SHOULDER SURGERY       Family History  Problem Relation Age of Onset  . Hypertension Mother   . Thyroid disease Mother   . Cancer Father        brain tumor (gleoplastoma)  . Diabetes Father   . Colon polyps  Father   . Colon cancer Neg Hx   . Stomach cancer Neg Hx   . Rectal cancer Neg Hx   . Esophageal cancer Neg Hx      Social History   Socioeconomic History  . Marital status: Married    Spouse name: Romie Minus  . Number of children: Not on file  . Years of education: Not on file  . Highest education level: Bachelor's degree (e.g., BA, AB, BS)  Occupational History    Comment: retired  Tobacco Use  . Smoking status: Never Smoker  . Smokeless tobacco: Never Used  Vaping Use  .  Vaping Use: Never used  Substance and Sexual Activity  . Alcohol use: Yes    Comment: socially  . Drug use: No  . Sexual activity: Never  Other Topics Concern  . Not on file  Social History Narrative   Lives with wife   Caffeine- coffee 3 cups daily, maybe a soda   Social Determinants of Health   Financial Resource Strain:   . Difficulty of Paying Living Expenses: Not on file  Food Insecurity:   . Worried About Charity fundraiser in the Last Year: Not on file  . Ran Out of Food in the Last Year: Not on file  Transportation Needs:   . Lack of Transportation (Medical): Not on file  . Lack of Transportation (Non-Medical): Not on file  Physical Activity:   . Days of Exercise per Week: Not on file  . Minutes of Exercise per Session: Not on file  Stress:   . Feeling of Stress : Not on file  Social Connections:   . Frequency of Communication with Friends and Family: Not on file  . Frequency of Social Gatherings with Friends and Family: Not on file  . Attends Religious Services: Not on file  . Active Member of Clubs or Organizations: Not on file  . Attends Archivist Meetings: Not on file  . Marital Status: Not on file  Intimate Partner Violence:   . Fear of Current or Ex-Partner: Not on file  . Emotionally Abused: Not on file  . Physically Abused: Not on file  . Sexually Abused: Not on file     No Known Allergies   Prior to Admission medications   Medication Sig Start Date End Date Taking? Authorizing Provider  albuterol (VENTOLIN HFA) 108 (90 Base) MCG/ACT inhaler Inhale 1-2 puffs into the lungs every 4 (four) hours as needed for wheezing or shortness of breath. 01/10/20  Yes [name], MD  amLODipine (NORVASC) 5 MG tablet Take 1 tablet (5 mg total) by mouth daily. 12/11/19  Yes [name], MD  atorvastatin (LIPITOR) 10 MG tablet Take 1 tablet (10 mg total) by mouth daily. 09/25/19  Yes [name], MD  carbidopa-levodopa (SINEMET IR) 25-100 MG  tablet Take 1 tablet by mouth 3 (three) times daily.  01/31/19  Yes [provider]  ibuprofen (ADVIL,MOTRIN) 200 MG tablet Take 200 mg by mouth every 6 (six) hours as needed for pain.   Yes [provider]  Multiple Vitamin (MULTIVITAMIN) tablet Take 1 tablet by mouth.   Yes [provider]  omeprazole (PRILOSEC) 20 MG capsule Take 1 capsule (20 mg total) by mouth daily. 09/25/19  Yes [name], MD  vitamin C (ASCORBIC ACID) 500 MG tablet Take 500 mg by mouth daily.   Yes [provider]     Depression screen [facility] 2/9 04/04/2020 09/25/2019 03/27/2019 02/13/2019 08/25/2018  Decreased Interest 0 0 0 0 0  Down, Depressed, Hopeless 0 0 0 0 0  PHQ - 2 Score 0 0 0 0 0     Fall Risk  04/04/2020 09/25/2019 04/11/2019 03/27/2019 02/13/2019  Falls in the past year? 0 0 0 0 0  Comment - - - - -  Number falls in past yr: 0 - - 0 0  Injury with Fall? 0 - - - 0  Follow up Falls evaluation completed;Education provided Falls evaluation completed - Falls evaluation completed Falls evaluation completed      PHYSICAL EXAM: BP (!) 144/88 Comment: taken from a previous visit/ not in clinic  Ht 5\' 7"  (1.702 m)   Wt 182 lb (82.6 kg) Comment: per patient  BMI 28.51 kg/m    Wt Readings from Last 3 Encounters:  04/04/20 182 lb (82.6 kg)  09/25/19 188 lb 12.8 oz (85.6 kg)  04/11/19 188 lb (85.3 kg)       Education/Counseling provided regarding diet and exercise, prevention of chronic diseases, smoking/tobacco cessation, if applicable, and reviewed "Covered Medicare Preventive Services."   .

## 2020-04-04 NOTE — Patient Instructions (Addendum)
Thank you for taking time to come for your Medicare Wellness Visit. I appreciate your ongoing commitment to your health goals. Please review the following plan we discussed and let me know if I can assist you in the future.  Leroy Kennedy LPN   Advance Directive  Advance directives are legal documents that let you make choices ahead of time about your health care and medical treatment in case you become unable to communicate for yourself. Advance directives are a way for you to make known your wishes to family, friends, and health care providers. This can let others know about your end-of-life care if you become unable to communicate. Discussing and writing advance directives should happen over time rather than all at once. Advance directives can be changed depending on your situation and what you want, even after you have signed the advance directives. There are different types of advance directives, such as:  Medical power of attorney.  Living will.  Do not resuscitate (DNR) or do not attempt resuscitation (DNAR) order. Health care proxy and medical power of attorney A health care proxy is also called a health care agent. This is a person who is appointed to make medical decisions for you in cases where you are unable to make the decisions yourself. Generally, people choose someone they know well and trust to represent their preferences. Make sure to ask this person for an agreement to act as your proxy. A proxy may have to exercise judgment in the event of a medical decision for which your wishes are not known. A medical power of attorney is a legal document that names your health care proxy. Depending on the laws in your state, after the document is written, it may also need to be:  Signed.  Notarized.  Dated.  Copied.  Witnessed.  Incorporated into your medical record. You may also want to appoint someone to manage your money in a situation in which you are unable to do so. This is  called a durable power of attorney for finances. It is a separate legal document from the durable power of attorney for health care. You may choose the same person or someone different from your health care proxy to act as your agent in money matters. If you do not appoint a proxy, or if there is a concern that the proxy is not acting in your best interests, a court may appoint a guardian to act on your behalf. Living will A living will is a set of instructions that state your wishes about medical care when you cannot express them yourself. Health care providers should keep a copy of your living will in your medical record. You may want to give a copy to family members or friends. To alert caregivers in case of an emergency, you can place a card in your wallet to let them know that you have a living will and where they can find it. A living will is used if you become:  Terminally ill.  Disabled.  Unable to communicate or make decisions. Items to consider in your living will include:  To use or not to use life-support equipment, such as dialysis machines and breathing machines (ventilators).  A DNR or DNAR order. This tells health care providers not to use cardiopulmonary resuscitation (CPR) if breathing or heartbeat stops.  To use or not to use tube feeding.  To be given or not to be given food and fluids.  Comfort (palliative) care when the goal becomes comfort rather  than a cure.  Donation of organs and tissues. A living will does not give instructions for distributing your money and property if you should pass away. DNR or DNAR A DNR or DNAR order is a request not to have CPR in the event that your heart stops beating or you stop breathing. If a DNR or DNAR order has not been made and shared, a health care provider will try to help any patient whose heart has stopped or who has stopped breathing. If you plan to have surgery, talk with your health care provider about how your DNR or DNAR  order will be followed if problems occur. What if I do not have an advance directive? If you do not have an advance directive, some states assign family decision makers to act on your behalf based on how closely you are related to them. Each state has its own laws about advance directives. You may want to check with your health care provider, attorney, or state representative about the laws in your state. Summary  Advance directives are the legal documents that allow you to make choices ahead of time about your health care and medical treatment in case you become unable to tell others about your care.  The process of discussing and writing advance directives should happen over time. You can change the advance directives, even after you have signed them.  Advance directives include DNR or DNAR orders, living wills, and designating an agent as your medical power of attorney. This information is not intended to replace advice given to you by your health care provider. Make sure you discuss any questions you have with your health care provider. Document Revised: 11/17/2018 Document Reviewed: 11/17/2018 Elsevier Patient Education  [place] 65 Years and Older, Male Preventive care refers to lifestyle choices and visits with your health care provider that can promote health and wellness. This includes:  A yearly physical exam. This is also called an annual well check.  Regular dental and eye exams.  Immunizations.  Screening for certain conditions.  Healthy lifestyle choices, such as diet and exercise. What can I expect for my preventive care visit? Physical exam Your health care provider will check:  Height and weight. These may be used to calculate body mass index (BMI), which is a measurement that tells if you are at a healthy weight.  Heart rate and blood pressure.  Your skin for abnormal spots. Counseling Your health care provider may ask you questions  about:  Alcohol, tobacco, and drug use.  Emotional well-being.  Home and relationship well-being.  Sexual activity.  Eating habits.  History of falls.  Memory and ability to understand (cognition).  Work and work Statistician. What immunizations do I need?  Influenza (flu) vaccine  This is recommended every year. Tetanus, diphtheria, and pertussis (Tdap) vaccine  You may need a Td booster every 10 years. Varicella (chickenpox) vaccine  You may need this vaccine if you have not already been vaccinated. Zoster (shingles) vaccine  You may need this after age 22. Pneumococcal conjugate (PCV13) vaccine  One dose is recommended after age 33. Pneumococcal polysaccharide (PPSV23) vaccine  One dose is recommended after age 93. Measles, mumps, and rubella (MMR) vaccine  You may need at least one dose of MMR if you were born in 1957 or later. You may also need a second dose. Meningococcal conjugate (MenACWY) vaccine  You may need this if you have certain conditions. Hepatitis A vaccine  You may need this  if you have certain conditions or if you travel or work in places where you may be exposed to hepatitis A. Hepatitis B vaccine  You may need this if you have certain conditions or if you travel or work in places where you may be exposed to hepatitis B. Haemophilus influenzae type b (Hib) vaccine  You may need this if you have certain conditions. You may receive vaccines as individual doses or as more than one vaccine together in one shot (combination vaccines). Talk with your health care provider about the risks and benefits of combination vaccines. What tests do I need? Blood tests  Lipid and cholesterol levels. These may be checked every 5 years, or more frequently depending on your overall health.  Hepatitis C test.  Hepatitis B test. Screening  Lung cancer screening. You may have this screening every year starting at age 56 if you have a 30-pack-year history of  smoking and currently smoke or have quit within the past 15 years.  Colorectal cancer screening. All adults should have this screening starting at age 20 and continuing until age 55. Your health care provider may recommend screening at age 24 if you are at increased risk. You will have tests every 1-10 years, depending on your results and the type of screening test.  Prostate cancer screening. Recommendations will vary depending on your family history and other risks.  Diabetes screening. This is done by checking your blood sugar (glucose) after you have not eaten for a while (fasting). You may have this done every 1-3 years.  Abdominal aortic aneurysm (AAA) screening. You may need this if you are a current or former smoker.  Sexually transmitted disease (STD) testing. Follow these instructions at home: Eating and drinking  Eat a diet that includes fresh fruits and vegetables, whole grains, lean protein, and low-fat dairy products. Limit your intake of foods with high amounts of sugar, saturated fats, and salt.  Take vitamin and mineral supplements as recommended by your health care provider.  Do not drink alcohol if your health care provider tells you not to drink.  If you drink alcohol: ? Limit how much you have to 0-2 drinks a day. ? Be aware of how much alcohol is in your drink. In the [place] one drink equals one 12 oz bottle of beer (355 mL), one 5 oz glass of wine (148 mL), or one 1 oz glass of hard liquor (44 mL). Lifestyle  Take daily care of your teeth and gums.  Stay active. Exercise for at least 30 minutes on 5 or more days each week.  Do not use any products that contain nicotine or tobacco, such as cigarettes, e-cigarettes, and chewing tobacco. If you need help quitting, ask your health care provider.  If you are sexually active, practice safe sex. Use a condom or other form of protection to prevent STIs (sexually transmitted infections).  Talk with your health care  provider about taking a low-dose aspirin or statin. What's next?  Visit your health care provider once a year for a well check visit.  Ask your health care provider how often you should have your eyes and teeth checked.  Stay up to date on all vaccines. This information is not intended to replace advice given to you by your health care provider. Make sure you discuss any questions you have with your health care provider. Document Revised: 04/14/2018 Document Reviewed: 04/14/2018 Elsevier Patient Education  2020 [employer].

## 2020-04-15 ENCOUNTER — Other Ambulatory Visit: Payer: Self-pay | Admitting: Family Medicine

## 2020-04-15 DIAGNOSIS — K219 Gastro-esophageal reflux disease without esophagitis: Secondary | ICD-10-CM

## 2020-04-29 DIAGNOSIS — R972 Elevated prostate specific antigen [PSA]: Secondary | ICD-10-CM | POA: Diagnosis not present

## 2020-04-30 ENCOUNTER — Other Ambulatory Visit: Payer: Self-pay | Admitting: Family Medicine

## 2020-04-30 DIAGNOSIS — E785 Hyperlipidemia, unspecified: Secondary | ICD-10-CM

## 2020-05-04 ENCOUNTER — Other Ambulatory Visit: Payer: Self-pay | Admitting: Family Medicine

## 2020-06-05 ENCOUNTER — Ambulatory Visit: Payer: Medicare HMO | Admitting: Family Medicine

## 2020-06-06 ENCOUNTER — Other Ambulatory Visit: Payer: Self-pay

## 2020-06-06 ENCOUNTER — Encounter: Payer: Self-pay | Admitting: Family Medicine

## 2020-06-06 ENCOUNTER — Ambulatory Visit (INDEPENDENT_AMBULATORY_CARE_PROVIDER_SITE_OTHER): Payer: Medicare HMO | Admitting: Family Medicine

## 2020-06-06 VITALS — BP 150/94 | HR 67 | Temp 98.1°F | Ht 67.0 in | Wt 185.0 lb

## 2020-06-06 DIAGNOSIS — K219 Gastro-esophageal reflux disease without esophagitis: Secondary | ICD-10-CM

## 2020-06-06 DIAGNOSIS — E785 Hyperlipidemia, unspecified: Secondary | ICD-10-CM | POA: Diagnosis not present

## 2020-06-06 DIAGNOSIS — I10 Essential (primary) hypertension: Secondary | ICD-10-CM | POA: Diagnosis not present

## 2020-06-06 DIAGNOSIS — R251 Tremor, unspecified: Secondary | ICD-10-CM | POA: Diagnosis not present

## 2020-06-06 MED ORDER — ATORVASTATIN CALCIUM 10 MG PO TABS: 10.0000 mg | ORAL_TABLET | Freq: Every day | ORAL | 1 refills | Status: DC

## 2020-06-06 MED ORDER — AMLODIPINE BESYLATE 5 MG PO TABS: 5.0000 mg | ORAL_TABLET | Freq: Every day | ORAL | 1 refills | Status: DC

## 2020-06-06 MED ORDER — OMEPRAZOLE 20 MG PO CPDR: 20.0000 mg | DELAYED_RELEASE_CAPSULE | Freq: Every day | ORAL | 1 refills | Status: DC

## 2020-06-06 NOTE — Patient Instructions (Addendum)
I recommend discussing parkinsonism and treatment options with your movement disorder specialist at [facility].   Blood pressure elevated today again. Return to 5mg  per day. If you still have elevated readings in next few weeks - need to adjust further. Follow up if readings remain over 130/80 - sooner if low or new side effects.   Continue omeprazole - ok to skip a day on occasion if symptoms controlled. If return of symptoms take daily.   Let me know if there are questions and take care.   If you have lab work done today you will be contacted with your lab results within the next 2 weeks.  If you have not heard from [place] then please contact us. The fastest way to get your results is to register for My Chart.   IF you received an x-ray today, you will receive an invoice from [facility]. Please contact [facility] at [phone] with questions or concerns regarding your invoice.   IF you received labwork today, you will receive an invoice from [place]. Please contact [facility] at [phone] with questions or concerns regarding your invoice.   Our billing staff will not be able to assist you with questions regarding bills from these companies.  You will be contacted with the lab results as soon as they are available. The fastest way to get your results is to activate your My Chart account. Instructions are located on the last page of this paperwork. If you have not heard from [place] regarding the results in 2 weeks, please contact this office.

## 2020-06-06 NOTE — Progress Notes (Signed)
Subjective:  Patient ID: Alan Ball, male    DOB: Apr 11, 1951  Age: 70 y.o. MRN: [id number]  CC:  Chief Complaint  Patient presents with  . Follow-up    On hyperlipidemia, hypertension, Gastroesophageal reflux disease, and med refill.Pt reports at home his BP is running around 128/78 in the evening, but his morning BP's have been high around 151/82. Pt Reports no issues with his acid reflux since last OV. Pt reports for getting to take his BP medication at times. Pt didn't take his BP medication today. Pt is currently fasting for his labs. PT was informed that we will be focusing on these follow-up issues.    HPI Alan Ball presents for   Parkinsonism Seen in neurology, movement disorders clinic, [facility].  Treatment for DAT positive parkinsonism.  Last appointment September 30.  Sinemet 25/100 3 times daily, somewhat infrequent dosing.  Option of adding Artane and/or dopamine agonist but with minimal symptoms deferred initially.  Recommended exercise.  30 minutes 6 days/week. Not taking sinemet, even with taking TID. stopped 2 weeks ago - did not feel like it was working, tapered off.  Has not discussed with movement disorder specialist. Next appt in June. Intermittent symptoms. Not interested in other treatments mentioned last visit. Not interfering with daily activity, just hand shaking.  GERD: Omeprazole 20 mg daily. Rare missed dose.  No breakthrough symptoms.   Hypertension: Amlodipine 2.5 mg daily. Had been up to 5mg  - min change in blood pressure, no low readings.  AM blood pressures 141-151/86, afternoon 130's, then in evening 127/76.  Has not taken medication today. Last dose yesterday am.  Fasting today.   Home readings: BP Readings from Last 3 Encounters:  06/06/20 (!) 150/94  04/04/20 (!) 144/88  09/25/19 (!) 144/88   Lab Results  Component Value Date   CREATININE 0.90 09/25/2019   Hyperlipidemia: Lipitor 10 mg daily. No new side  effects. No new myalgias. Chronic neck issues - followed by Dr. Shon Baton. Considering repeat cortisone injection.  Lab Results  Component Value Date   CHOL 155 09/25/2019   HDL 58 09/25/2019   LDLCALC 82 09/25/2019   TRIG 81 09/25/2019   CHOLHDL 2.7 09/25/2019   Lab Results  Component Value Date   ALT 26 09/25/2019   AST 35 09/25/2019   ALKPHOS 88 09/25/2019   BILITOT 0.6 09/25/2019       History Patient Active Problem List   Diagnosis Date Noted  . Parkinsonism (HCC) 10/11/2019  . Cervical radiculopathy 02/21/2019  . DDD (degenerative disc disease), cervical 02/21/2019  . Bilateral carpal tunnel syndrome 05/19/2018  . 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 Allergy   . Asthma   . Hyperlipemia   . Hypertension   . Tremor    Past Surgical History:  Procedure Laterality Date  . CARPAL TUNNEL RELEASE Bilateral 2019, 2020  . HERNIA REPAIR Bilateral    inguinal  . SHOULDER SURGERY     No Known Allergies Prior to Admission medications   Medication Sig Start Date End Date Taking? Authorizing Provider  albuterol (VENTOLIN HFA) 108 (90 Base) MCG/ACT inhaler Inhale 1-2 puffs into the lungs every 4 (four) hours as needed for wheezing or shortness of breath. 01/10/20   Shade Flood, MD  amLODipine (NORVASC) 5 MG tablet TAKE ONE TABLET BY MOUTH ONE TIME DAILY 05/06/20   Shade Flood, MD  atorvastatin (LIPITOR) 10 MG tablet TAKE ONE TABLET BY  MOUTH ONE TIME DAILY 04/30/20   [name], MD  carbidopa-levodopa (SINEMET IR) 25-100 MG tablet Take 1 tablet by mouth 3 (three) times daily.  01/31/19   [provider]  ibuprofen (ADVIL,MOTRIN) 200 MG tablet Take 200 mg by mouth every 6 (six) hours as needed for pain.    [provider]  Multiple Vitamin (MULTIVITAMIN) tablet Take 1 tablet by mouth.    [provider]  omeprazole (PRILOSEC) 20 MG capsule TAKE ONE CAPSULE BY MOUTH ONE TIME DAILY 04/15/20   [name], MD  vitamin C (ASCORBIC ACID) 500 MG tablet Take 500 mg by mouth daily.    [provider]   Social History   Socioeconomic History  . Marital status: Married    Spouse name: Alan Ball  . Number of children: Not on file  . Years of education: Not on file  . Highest education level: Bachelor's degree (e.g., BA, AB, BS)  Occupational History    Comment: retired  Tobacco Use  . Smoking status: Never Smoker  . Smokeless tobacco: Never Used  Vaping Use  . Vaping Use: Never used  Substance and Sexual Activity  . Alcohol use: Yes    Comment: socially  . Drug use: No  . Sexual activity: Never  Other Topics Concern  . Not on file  Social History Narrative   Lives with wife   Caffeine- coffee 3 cups daily, maybe a soda   Social Determinants of Health   Financial Resource Strain: Not on file  Food Insecurity: Not on file  Transportation Needs: Not on file  Physical Activity: Not on file  Stress: Not on file  Social Connections: Not on file  Intimate Partner Violence: Not on file    Review of Systems  Constitutional: Negative for fatigue and unexpected weight change.  Eyes: Negative for visual disturbance.  Respiratory: Negative for cough, chest tightness and shortness of breath.   Cardiovascular: Negative for chest pain, palpitations and leg swelling.  Gastrointestinal: Negative for abdominal pain and blood in stool.  Neurological: Negative for dizziness, light-headedness and headaches.     Objective:   Vitals:   06/06/20 1424 06/06/20 1427  BP: (!) 157/89 (!) 150/94  Pulse: 67   Temp: 98.1 F (36.7 C)   TempSrc: Temporal   SpO2: 96%   Weight: 185 lb (83.9 kg)   Height: 5\' 7"  (1.702 m)      Physical Exam Vitals reviewed.  Constitutional:      Appearance: He is well-developed and well-nourished.  HENT:     Head: Normocephalic and atraumatic.  Eyes:     Extraocular Movements: EOM normal.     Pupils: Pupils are equal, round, and reactive to  light.  Neck:     Vascular: No carotid bruit or JVD.  Cardiovascular:     Rate and Rhythm: Normal rate and regular rhythm.     Heart sounds: Normal heart sounds. No murmur heard.   Pulmonary:     Effort: Pulmonary effort is normal.     Breath sounds: Normal breath sounds. No rales.  Musculoskeletal:        General: No edema.  Skin:    General: Skin is warm and dry.  Neurological:     Mental Status: He is alert and oriented to person, place, and time.  Psychiatric:        Mood and Affect: Mood and affect and mood normal.        Behavior: Behavior normal.  Thought Content: Thought content normal.        Judgment: Judgment normal.    30 minutes spent during visit, greater than 50% counseling and assimilation of information, chart review, and discussion of plan.   Assessment & Plan:  Alan Ball is a 70 y.o. male . Essential hypertension - Plan: amLODipine (NORVASC) 5 MG tablet, Lipid panel, Comprehensive metabolic panel  -Off meds today but elevated home readings, particularly in the morning.  Restart 5 mg daily, monitor home readings and may need further adjustments, orthostasis precautions.  Hyperlipidemia, unspecified hyperlipidemia type - Plan: atorvastatin (LIPITOR) 10 MG tablet, Lipid panel, Comprehensive metabolic panel  -Tolerating current regimen, continue same  Gastroesophageal reflux disease, unspecified whether esophagitis present - Plan: omeprazole (PRILOSEC) 20 MG capsule  -Option of intermittent dosing based on symptom control.  Refill same dose  Tremor/parkinsonism  -Self titrated off Sinemet as he did not see noticeable improvement.  Recommend he discuss this with his movement specialist as well as other potential medications but as he is having minimal impact to ADLs declines new treatments at this time.  No orders of the defined types were placed in this encounter.  Patient Instructions       If you have lab work done today you will be contacted  with your lab results within the next 2 weeks.  If you have not heard from [place] then please contact us. The fastest way to get your results is to register for My Chart.   IF you received an x-ray today, you will receive an invoice from [facility]. Please contact [facility] at [phone] with questions or concerns regarding your invoice.   IF you received labwork today, you will receive an invoice from [place]. Please contact [facility] at [phone] with questions or concerns regarding your invoice.   Our billing staff will not be able to assist you with questions regarding bills from these companies.  You will be contacted with the lab results as soon as they are available. The fastest way to get your results is to activate your My Chart account. Instructions are located on the last page of this paperwork. If you have not heard from [place] regarding the results in 2 weeks, please contact this office.         Signed, [name], MD [facility]

## 2020-06-07 LAB — COMPREHENSIVE METABOLIC PANEL
ALT: 23 IU/L (ref 0–44)
AST: 30 IU/L (ref 0–40)
Albumin/Globulin Ratio: 1.8 (ref 1.2–2.2)
Albumin: 4.4 g/dL (ref 3.7–4.7)
Alkaline Phosphatase: 90 IU/L (ref 44–121)
BUN/Creatinine Ratio: 14 (ref 10–24)
BUN: 13 mg/dL (ref 8–27)
Bilirubin Total: 0.7 mg/dL (ref 0.0–1.2)
CO2: 24 mmol/L (ref 20–29)
Calcium: 9.5 mg/dL (ref 8.6–10.2)
Chloride: 102 mmol/L (ref 96–106)
Creatinine, Ser: 0.95 mg/dL (ref 0.76–1.27)
GFR calc Af Amer: 93 mL/min/{1.73_m2} (ref >59)
GFR calc non Af Amer: 80 mL/min/{1.73_m2} (ref >59)
Globulin, Total: 2.5 g/dL (ref 1.5–4.5)
Glucose: 87 mg/dL (ref 65–99)
Potassium: 4.7 mmol/L (ref 3.5–5.2)
Sodium: 140 mmol/L (ref 134–144)
Total Protein: 6.9 g/dL (ref 6.0–8.5)

## 2020-06-07 LAB — LIPID PANEL
Chol/HDL Ratio: 3.1 ratio (ref 0.0–5.0)
Cholesterol, Total: 169 mg/dL (ref 100–199)
HDL: 54 mg/dL (ref >39)
LDL Chol Calc (NIH): 102 mg/dL — ABNORMAL HIGH (ref 0–99)
Triglycerides: 70 mg/dL (ref 0–149)
VLDL Cholesterol Cal: 13 mg/dL (ref 5–40)

## 2020-06-10 DIAGNOSIS — R3912 Poor urinary stream: Secondary | ICD-10-CM | POA: Diagnosis not present

## 2020-06-10 DIAGNOSIS — N5201 Erectile dysfunction due to arterial insufficiency: Secondary | ICD-10-CM | POA: Diagnosis not present

## 2020-06-10 DIAGNOSIS — R3915 Urgency of urination: Secondary | ICD-10-CM | POA: Diagnosis not present

## 2020-06-10 DIAGNOSIS — N401 Enlarged prostate with lower urinary tract symptoms: Secondary | ICD-10-CM | POA: Diagnosis not present

## 2020-06-10 DIAGNOSIS — R972 Elevated prostate specific antigen [PSA]: Secondary | ICD-10-CM | POA: Diagnosis not present

## 2020-06-11 ENCOUNTER — Encounter: Payer: Self-pay | Admitting: Family Medicine

## 2020-06-25 ENCOUNTER — Encounter: Payer: Self-pay | Admitting: Family Medicine

## 2020-06-25 NOTE — Telephone Encounter (Signed)
Pt saw you 06/06/2020 states was supposed to see [place] [facility] but I do not see referral last was from may 2021 please advise

## 2020-06-25 NOTE — Telephone Encounter (Signed)
Please see prior message on 2/8 - it looks like we were seeing if new referral needed to be placed.

## 2020-07-22 ENCOUNTER — Encounter: Payer: Self-pay | Admitting: Family Medicine

## 2020-07-23 ENCOUNTER — Encounter: Payer: Self-pay | Admitting: Physician Assistant

## 2020-07-23 ENCOUNTER — Other Ambulatory Visit: Payer: Self-pay

## 2020-07-23 ENCOUNTER — Ambulatory Visit: Payer: Medicare HMO | Admitting: Physician Assistant

## 2020-07-23 VITALS — BP 146/76 | HR 70 | Ht 67.0 in | Wt 187.0 lb

## 2020-07-23 DIAGNOSIS — K219 Gastro-esophageal reflux disease without esophagitis: Secondary | ICD-10-CM

## 2020-07-23 DIAGNOSIS — R140 Abdominal distension (gaseous): Secondary | ICD-10-CM | POA: Diagnosis not present

## 2020-07-23 DIAGNOSIS — R6881 Early satiety: Secondary | ICD-10-CM | POA: Diagnosis not present

## 2020-07-23 DIAGNOSIS — R1013 Epigastric pain: Secondary | ICD-10-CM

## 2020-07-23 MED ORDER — OMEPRAZOLE 20 MG PO CPDR: 20.0000 mg | DELAYED_RELEASE_CAPSULE | Freq: Two times a day (BID) | ORAL | 3 refills | Status: DC

## 2020-07-23 NOTE — Progress Notes (Signed)
Chief Complaint: Abdominal pain and cramping  HPI:    Alan Ball is a 70 year old male with a past medical history as listed below, known to Dr. Silverio Decamp, who was referred to me by [name], MD for a complaint of abdominal pain and cramping.      02/27/2019 patient seen in clinic by Dr. Silverio Decamp for reflux, bloating and epigastric discomfort.  He was set up for an EGD and colonoscopy.  Continued on omeprazole 20 mg daily.    Colonoscopy 03/13/2019 with 3 5-11 mm polyps in the rectum and in the ascending colon, nonbleeding internal hemorrhoids and otherwise normal.  EGD on the same day with mucosal changes suspicious for short segment Barrett's, small hiatal hernia.  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  Repeat EGD and colonoscopy recommended in 3 years.    06/06/2020 CMP normal.    Today, the patient tells me that he has chronic epigastric discomfort and some tightness/hardness and bloating in this area after eating.  Recently on 07/20/2020 he felt constipated and did not eat much, this is very abnormal for him because he has regular large stools on a daily basis.  He then started with terrible cramping the next day, rated as a 9-10/10, which felt like a "heart attack" due to pain right up underneath his sternum and he felt nauseous and slept all day, tells me he was only awake like an hour.  He had another day of cramping yesterday but it has since subsided and he is back to his normal.  He is now eating again though somewhat bland and his bowel movements have returned to normal.  He did not eat anything abnormal the night before and is not sure what set him off.  His main concern now is of this "hard stomach".  Does describe frequent belching.  Currently on Omeprazole 20 mg daily.    Denies fever, chills, blood in his stool, weight loss, vomiting or symptoms that awaken him from sleep.  Past Medical History:  Diagnosis Date  . Allergy   . Asthma    . Hyperlipemia   . Hypertension   . Tremor     Past Surgical History:  Procedure Laterality Date  . CARPAL TUNNEL RELEASE Bilateral 2019, 2020  . HERNIA REPAIR Bilateral    inguinal  . SHOULDER SURGERY      Current Outpatient Medications  Medication Sig Dispense Refill  . albuterol (VENTOLIN HFA) 108 (90 Base) MCG/ACT inhaler Inhale 1-2 puffs into the lungs every 4 (four) hours as needed for wheezing or shortness of breath. 18 g 0  . amLODipine (NORVASC) 5 MG tablet Take 1 tablet (5 mg total) by mouth daily. 90 tablet 1  . atorvastatin (LIPITOR) 10 MG tablet Take 1 tablet (10 mg total) by mouth daily. 90 tablet 1  . carbidopa-levodopa (SINEMET IR) 25-100 MG tablet Take 1 tablet by mouth 3 (three) times daily.     Marland Kitchen ibuprofen (ADVIL,MOTRIN) 200 MG tablet Take 200 mg by mouth every 6 (six) hours as needed for pain.    . Multiple Vitamin (MULTIVITAMIN) tablet Take 1 tablet by mouth.    Marland Kitchen omeprazole (PRILOSEC) 20 MG capsule Take 1 capsule (20 mg total) by mouth daily. 90 capsule 1  . vitamin C (ASCORBIC ACID) 500 MG tablet Take 500 mg by mouth daily.     No current facility-administered medications for this visit.    Allergies as of 07/23/2020  . (No Known Allergies)  Family History  Problem Relation Age of Onset  . Hypertension Mother   . Thyroid disease Mother   . Cancer Father        brain tumor (gleoplastoma)  . Diabetes Father   . Colon polyps Father   . Colon cancer Neg Hx   . Stomach cancer Neg Hx   . Rectal cancer Neg Hx   . Esophageal cancer Neg Hx     Social History   Socioeconomic History  . Marital status: Married    Spouse name: Alan Ball  . Number of children: Not on file  . Years of education: Not on file  . Highest education level: Bachelor's degree (e.g., BA, AB, BS)  Occupational History    Comment: retired  Tobacco Use  . Smoking status: Never Smoker  . Smokeless tobacco: Never Used  Vaping Use  . Vaping Use: Never used  Substance and Sexual  Activity  . Alcohol use: Yes    Comment: socially  . Drug use: No  . Sexual activity: Never  Other Topics Concern  . Not on file  Social History Narrative   Lives with wife   Caffeine- coffee 3 cups daily, maybe a soda   Social Determinants of Health   Financial Resource Strain: Not on file  Food Insecurity: Not on file  Transportation Needs: Not on file  Physical Activity: Not on file  Stress: Not on file  Social Connections: Not on file  Intimate Partner Violence: Not on file    Review of Systems:    Constitutional: No weight loss, fever or chills Cardiovascular: No chest pain  Respiratory: No SOB  Gastrointestinal: See HPI and otherwise negative   Physical Exam:  Vital signs: BP (!) 146/76   Pulse 70   Ht 5\' 7"  (1.702 m)   Wt 187 lb (84.8 kg)   BMI 29.29 kg/m   Constitutional:   Pleasant male appears to be in NAD, Well developed, Well nourished, alert and cooperative Respiratory: Respirations even and unlabored. Lungs clear to auscultation bilaterally.   No wheezes, crackles, or rhonchi.  Cardiovascular: Normal S1, S2. No MRG. Regular rate and rhythm. No peripheral edema, cyanosis or pallor.  Gastrointestinal:  Soft, nondistended, mild epigastric ttp, No rebound or guarding. Normal bowel sounds. No appreciable masses or hepatomegaly. Rectal:  Not performed.  Psychiatric: Demonstrates good judgement and reason without abnormal affect or behaviors.  RELEVANT LABS AND IMAGING: CBC    Component Value Date/Time   WBC 7.1 02/13/2019 1452   RBC 5.22 02/13/2019 1452   HGB 15.5 02/13/2019 1452   HCT 45.0 02/13/2019 1452   PLT 332 02/13/2019 1452   MCV 86 02/13/2019 1452   MCH 29.7 02/13/2019 1452   MCHC 34.4 02/13/2019 1452   RDW 12.7 02/13/2019 1452    CMP     Component Value Date/Time   NA 140 06/06/2020 1528   K 4.7 06/06/2020 1528   CL 102 06/06/2020 1528   CO2 24 06/06/2020 1528   GLUCOSE 87 06/06/2020 1528   BUN 13 06/06/2020 1528   CREATININE 0.95  06/06/2020 1528   CALCIUM 9.5 06/06/2020 1528   PROT 6.9 06/06/2020 1528   ALBUMIN 4.4 06/06/2020 1528   AST 30 06/06/2020 1528   ALT 23 06/06/2020 1528   ALKPHOS 90 06/06/2020 1528   BILITOT 0.7 06/06/2020 1528   GFRNONAA 80 06/06/2020 1528   GFRAA 93 06/06/2020 1528    Assessment: 1.  Epigastric discomfort: Chronic for the patient, worse over the past 3 days  or so, better now; likely chronic gastritis, question exacerbation due to constipation, EGD in 2020 with intestinal metaplasia and recommendations to repeat in 3 years 2.  Bloating/indigestion: With above  Plan: 1.  Discussed with patient that he likely has a chronic element of gastritis which is not completely treated with Omeprazole 20 mg daily.  Recommend that we increase to twice daily dosing.  Patient is somewhat nervous of high doses of this medicine after reading side effects.  Told him he could just stay on it for 2 months and see how he is doing.  Prescribed Omeprazole 20 mg BID 30-60 minutes before breakfast and dinner #60 with 5 refills. 2.  Discussed with patient that if symptoms are continuing after above may discuss EGD sooner vs trying high dose, 40mg  BID. 3.  Patient to follow in clinic with Dr. Silverio Decamp or myself in 2 to 3 months.  [name], PA-C [facility] 07/23/2020, 2:46 PM  Cc: [name], MD

## 2020-07-23 NOTE — Patient Instructions (Signed)
If you are age 72 or older, your body mass index should be between 23-30. Your Body mass index is 29.29 kg/m. If this is out of the aforementioned range listed, please consider follow up with your Primary Care Provider.  If you are age 42 or younger, your body mass index should be between 19-25. Your Body mass index is 29.29 kg/m. If this is out of the aformentioned range listed, please consider follow up with your Primary Care Provider.   We have sent the following medications to your pharmacy for you to pick up at your convenience: Omeprazole 20 mg   Thank you for choosing me and [facility].  [name], PA-C

## 2020-07-29 ENCOUNTER — Encounter: Payer: Self-pay | Admitting: Surgery

## 2020-07-29 ENCOUNTER — Ambulatory Visit: Payer: Self-pay | Admitting: Surgery

## 2020-07-29 DIAGNOSIS — K2970 Gastritis, unspecified, without bleeding: Secondary | ICD-10-CM | POA: Insufficient documentation

## 2020-07-29 DIAGNOSIS — M7989 Other specified soft tissue disorders: Secondary | ICD-10-CM | POA: Diagnosis not present

## 2020-07-29 DIAGNOSIS — R251 Tremor, unspecified: Secondary | ICD-10-CM | POA: Diagnosis not present

## 2020-07-29 DIAGNOSIS — K429 Umbilical hernia without obstruction or gangrene: Secondary | ICD-10-CM | POA: Diagnosis not present

## 2020-07-29 DIAGNOSIS — G20 Parkinson's disease: Secondary | ICD-10-CM | POA: Diagnosis not present

## 2020-07-29 NOTE — H&P (Signed)
Alan Ball Appointment: 07/29/2020 10:15 AM Location: [facility] Patient #: [id number] DOB: [birth year] Married / Language: Alan Ball / Race: White Male  History of Present Illness Adin Hector MD; 07/29/2020 11:06 AM) The patient is a 70 year old male who presents with a soft tissue mass. Note for "Soft tissue mass": ` ` ` Patient sent for surgical consultation at the request of Alan Ball  Chief Complaint: Cyst on left buttocks. ` ` The patient ("[name]") is a male with some left-sided tremors with some parkinsonism. Noted a lump on his left buttocks on physical exam May 2021. Surgical consultation offered. That did not happen. Mentioned on annual visit again. Surgical consultation requested. Patient followed by neurology with workup.  Patient comes a with his wife. He feels a lump on his left buttocks is undergoing a more when he sits. To be irritating after a few hours. Does not recall ever draining pus. No fevers or chills. No history of skin or soft tissue problems elsewhere. No MRSA. He is not a smoker. No diabetic. No history of MRSA. Not on any blood thinners. They due to mile walks every day. He had inguinal hernias repaired over a decade ago without issue. Of some intermittent abdominal pain and cramping. Followed by [facility] gastroenterology. They suspected gastritis and recommended he double up on his omeprazole. He thinks it's help a little bit.  (Review of systems as stated in this history (HPI) or in the review of systems. Otherwise all other 12 point ROS are negative) ` ` ###########################################`  This patient encounter took 30 minutes today to perform the following: obtain history, perform exam, review outside records, interpret tests & imaging, counsel the patient on their diagnosis; and, document this encounter, including findings & plan in the electronic health record (EHR).   Past Surgical History [name], CMA; 07/29/2020 10:22 AM) Shoulder Surgery Left. Tonsillectomy Ventral / Umbilical Hernia Surgery Bilateral.  Diagnostic Studies History [name], CMA; 07/29/2020 10:22 AM) Colonoscopy within last year  Allergies Lars Mage Ball, CMA; 07/29/2020 10:22 AM) No Known Drug Allergies [07/29/2020]:  Medication History (Alan Ball, CMA; 07/29/2020 10:24 AM) amLODIPine Besylate (5MG  Tablet, Oral) Active. Omeprazole (20MG  Capsule DR, Oral) Active. Tadalafil (5MG  Tablet, Oral) Active. Atorvastatin Calcium (10MG  Tablet, Oral) Active. Medications Reconciled  Social History [name], CMA; 07/29/2020 10:22 AM) Alcohol use Occasional alcohol use. Caffeine use Coffee. No drug use Tobacco use Never smoker.  Family History [name], [place]; 07/29/2020 10:22 AM) Arthritis Father, Mother. Breast Cancer Mother. Cancer Father. Diabetes Mellitus Father. Hypertension Mother.  Other Problems [name], CMA; 07/29/2020 10:22 AM) Asthma Back Pain Gastroesophageal Reflux Disease General anesthesia - complications High blood pressure Hypercholesterolemia     Review of Systems (Alan Ball CMA; 07/29/2020 10:22 AM) General Not Present- Appetite Loss, Chills, Fatigue, Fever, Night Sweats, Weight Gain and Weight Loss. Skin Not Present- Change in Wart/Mole, Dryness, Hives, Jaundice, New Lesions, Non-Healing Wounds, Rash and Ulcer. HEENT Present- Hearing Loss, Ringing in the Ears and Wears glasses/contact lenses. Not Present- Earache, Hoarseness, Nose Bleed, Oral Ulcers, Seasonal Allergies, Sinus Pain, Sore Throat, Visual Disturbances and Yellow Eyes. Respiratory Present- Snoring. Not Present- Bloody sputum, Chronic Cough, Difficulty Breathing and Wheezing. Breast Not Present- Breast Mass, Breast Pain, Nipple Discharge and Skin Changes. Cardiovascular Present- Leg Cramps. Not Present- Chest Pain, Difficulty Breathing Lying Down,  Palpitations, Rapid Heart Rate, Shortness of Breath and Swelling of Extremities. Gastrointestinal Not Present- Abdominal Pain, Bloating, Bloody Stool, Change in Bowel Habits, Chronic diarrhea, Constipation, Difficulty  Swallowing, Excessive gas, Gets full quickly at meals, Hemorrhoids, Indigestion, Nausea, Rectal Pain and Vomiting. Male Genitourinary Present- Change in Urinary Stream and Urgency. Not Present- Blood in Urine, Frequency, Impotence, Nocturia, Painful Urination and Urine Leakage.  Vitals (Alan Ball CMA; 07/29/2020 10:22 AM) 07/29/2020 10:22 AM Weight: 186.2 lb Height: 67.5in Body Surface Area: 1.97 m Body Mass Index: 28.73 kg/m  Pulse: 100 (Regular)  BP: 172/98(Sitting, Left Arm, Standard)        Physical Exam Adin Hector MD; 07/29/2020 10:41 AM)  General Mental Status-Alert. General Appearance-Not in acute distress, Not Sickly. Orientation-Oriented X3. Hydration-Well hydrated. Voice-Normal.  Integumentary Global Assessment Upon inspection and palpation of skin surfaces of the - Axillae: non-tender, no inflammation or ulceration, no drainage. and Distribution of scalp and body hair is normal. General Characteristics Temperature - normal warmth is noted.  Head and Neck Head-normocephalic, atraumatic with no lesions or palpable masses. Face Global Assessment - atraumatic, no absence of expression. Neck Global Assessment - no abnormal movements, no bruit auscultated on the right, no bruit auscultated on the left, no decreased range of motion, non-tender. Trachea-midline. Thyroid Gland Characteristics - non-tender.  Eye Eyeball - Left-Extraocular movements intact, No Nystagmus - Left. Eyeball - Right-Extraocular movements intact, No Nystagmus - Right. Cornea - Left-No Hazy - Left. Cornea - Right-No Hazy - Right. Sclera/Conjunctiva - Left-No scleral icterus, No Discharge - Left. Sclera/Conjunctiva - Right-No scleral  icterus, No Discharge - Right. Pupil - Left-Direct reaction to light normal. Pupil - Right-Direct reaction to light normal.  ENMT Ears Pinna - Left - no drainage observed, no generalized tenderness observed. Pinna - Right - no drainage observed, no generalized tenderness observed. Nose and Sinuses External Inspection of the Nose - no destructive lesion observed. Inspection of the nares - Left - quiet respiration. Inspection of the nares - Right - quiet respiration. Mouth and Throat Lips - Upper Lip - no fissures observed, no pallor noted. Lower Lip - no fissures observed, no pallor noted. Nasopharynx - no discharge present. Oral Cavity/Oropharynx - Tongue - no dryness observed. Oral Mucosa - no cyanosis observed. Hypopharynx - no evidence of airway distress observed.  Chest and Lung Exam Inspection Movements - Normal and Symmetrical. Accessory muscles - No use of accessory muscles in breathing. Palpation Palpation of the chest reveals - Non-tender. Auscultation Breath sounds - Normal and Clear.  Cardiovascular Auscultation Rhythm - Regular. Murmurs & Other Heart Sounds - Auscultation of the heart reveals - No Murmurs and No Systolic Clicks.  Abdomen Inspection Inspection of the abdomen reveals - No Visible peristalsis and No Abnormal pulsations. Umbilicus - No Bleeding, No Urine drainage. Palpation/Percussion Palpation and Percussion of the abdomen reveal - Soft, Non Tender, No Rebound tenderness, No Rigidity (guarding) and No Cutaneous hyperesthesia. Note: Abdomen soft. Nontender. Mild diastases recti with umbilical hernia 15 mm on Valsalva. Not distended. No incisional hernias. No guarding.  Male Genitourinary Sexual Maturity Tanner 5 - Adult hair pattern and Adult penile size and shape. Note: Nontender. No inguinal hernias. No inguinal lymphadenopathy. No major vaginal bleeding nor foul discharge  Rectal Note: Left posterior gluteal mass 4x4 cm. Dermal. Sensitive.  Not inflamed. Suspicious for complex sebaceous cyst versus irritated lipoma.  Perianal skin clear with any fissure or fistula abscess. Some external hemorrhoids and intact sphincter tone. I held off on internal exam. No pilonidal disease.  Peripheral Vascular Upper Extremity Inspection - Left - No Cyanotic nailbeds - Left, Not Ischemic. Inspection - Right - No Cyanotic nailbeds - Right, Not Ischemic.  Neurologic Neurologic evaluation reveals -normal attention span and ability to concentrate, able to name objects and repeat phrases. Appropriate fund of knowledge , normal sensation and normal coordination. Mental Status Affect - not angry, not paranoid. Cranial Nerves-Normal Bilaterally. Gait-Normal.  Neuropsychiatric Mental status exam performed with findings of-able to articulate well with normal speech/language, rate, volume and coherence, thought content normal with ability to perform basic computations and apply abstract reasoning and no evidence of hallucinations, delusions, obsessions or homicidal/suicidal ideation.  Musculoskeletal Global Assessment Spine, Ribs and Pelvis - no instability, subluxation or laxity. Right Upper Extremity - no instability, subluxation or laxity.  Lymphatic Head & Neck  General Head & Neck Lymphatics: Bilateral - Description - No Localized lymphadenopathy. Axillary  General Axillary Region: Bilateral - Description - No Localized lymphadenopathy. Femoral & Inguinal  Generalized Femoral & Inguinal Lymphatics: Left - Description - No Localized lymphadenopathy. Right - Description - No Localized lymphadenopathy.    Assessment & Plan Adin Hector MD; 07/29/2020 11:07 AM)  MASS OF SOFT TISSUE OF HIP (M79.89) Impression: 4 cm left posterior subcutaneous/dermal mass. Most likely complex cyst versus lipoma.  It is bothering him. He like to have it removed. We will set up a time under local anesthesia and a minor OR room. He notes his son was  able to get cyst taken out under local only as well. He feels comfortable with local only and no need for sedation.  Current Plans You are being scheduled for surgery- Our schedulers will call you.  You should hear from our office's scheduling department within 5 working days about the location, date, and time of surgery. We try to make accommodations for patient's preferences in scheduling surgery, but sometimes the OR schedule or the surgeon's schedule prevents [place] from making those accommodations.  If you have not heard from our office [phone]) in 5 working days, call the office and ask for your surgeon's nurse.  If you have other questions about your diagnosis, plan, or surgery, call the office and ask for your surgeon's nurse.  The pathophysiology of skin & subcutaneous masses was discussed. Natural history risks without surgery were discussed. I recommended surgery to remove the mass. I explained the technique of removal with use of local anesthesia & possible need for more aggressive sedation/anesthesia for patient comfort.  Risks such as bleeding, infection, wound breakdown, heart attack, death, and other risks were discussed. I noted a good likelihood this will help address the problem. Possibility that this will not correct all symptoms was explained. Possibility of regrowth/recurrence of the mass was discussed. We will work to minimize complications. Questions were answered. The patient expresses understanding & wishes to proceed with surgery.   UMBILICAL HERNIA WITHOUT OBSTRUCTION AND WITHOUT GANGRENE (K42.9) Impression: Small umbilical hernia reducible and not causing any pain or symptoms. He spent some time talking about some vague upper abdominal complaints that I agree with gastroenterology sounds more like gastritis so I will defer to them since they are adjusting his antiacid medication. Because he does not have any [place] skilled type pain or worsening hernia, I  would not rush to fix that at this time. He would like to hold off as well.   TREMOR OF LEFT HAND (R25.1)   PARKINSONISM (G20)  Adin Hector, MD, FACS, MASCRS  Gastrointestinal and Minimally Invasive Surgery  [facility] [address] [place], [place] [address] [phone] Fax [phone] Main/Paging  CONTACT INFORMATION: Weekday (9AM-5PM) concerns: Call [facility] main office at  [phone] Weeknight (5PM-9AM) or Weekend/Holiday concerns: Check [email or web address] for General Surgery [facility] coverage (Please, do not use SecureChat as it is not reliable communication to operating surgeons for immediate patient care)

## 2020-08-23 ENCOUNTER — Other Ambulatory Visit (HOSPITAL_COMMUNITY): Payer: Medicare HMO

## 2020-08-23 ENCOUNTER — Encounter (HOSPITAL_BASED_OUTPATIENT_CLINIC_OR_DEPARTMENT_OTHER): Payer: Self-pay | Admitting: Surgery

## 2020-08-23 ENCOUNTER — Other Ambulatory Visit: Payer: Self-pay

## 2020-08-24 NOTE — Progress Notes (Signed)
Reviewed and agree with documentation and assessment and plan. K. Veena Eh Sauseda , MD   

## 2020-08-26 ENCOUNTER — Other Ambulatory Visit (HOSPITAL_COMMUNITY)
Admission: RE | Admit: 2020-08-26 | Discharge: 2020-08-26 | Disposition: A | Discharge status: Home / Self Care | Payer: Medicare HMO | Source: Ambulatory Visit | Attending: Surgery | Admitting: Surgery

## 2020-08-26 DIAGNOSIS — Z20822 Contact with and (suspected) exposure to covid-19: Secondary | ICD-10-CM | POA: Insufficient documentation

## 2020-08-26 DIAGNOSIS — Z01812 Encounter for preprocedural laboratory examination: Secondary | ICD-10-CM | POA: Diagnosis not present

## 2020-08-27 LAB — SARS CORONAVIRUS 2 (TAT 6-24 HRS): SARS Coronavirus 2: NEGATIVE

## 2020-08-28 ENCOUNTER — Other Ambulatory Visit (HOSPITAL_COMMUNITY): Payer: Medicare HMO

## 2020-08-28 ENCOUNTER — Ambulatory Visit (HOSPITAL_BASED_OUTPATIENT_CLINIC_OR_DEPARTMENT_OTHER): Admission: RE | Admit: 2020-08-28 | Payer: Medicare HMO | Source: Home / Self Care | Admitting: Surgery

## 2020-08-28 ENCOUNTER — Encounter: Payer: Self-pay | Admitting: Family Medicine

## 2020-08-28 SURGERY — EXCISION MASS
Anesthesia: LOCAL

## 2020-10-01 DIAGNOSIS — G20 Parkinson's disease: Secondary | ICD-10-CM | POA: Diagnosis not present

## 2020-10-03 ENCOUNTER — Encounter: Payer: Self-pay | Admitting: Gastroenterology

## 2020-10-03 ENCOUNTER — Ambulatory Visit: Payer: Medicare HMO | Admitting: Gastroenterology

## 2020-10-03 VITALS — BP 138/82 | HR 84 | Ht 67.0 in | Wt 186.5 lb

## 2020-10-03 DIAGNOSIS — K219 Gastro-esophageal reflux disease without esophagitis: Secondary | ICD-10-CM

## 2020-10-03 DIAGNOSIS — R140 Abdominal distension (gaseous): Secondary | ICD-10-CM

## 2020-10-03 DIAGNOSIS — K439 Ventral hernia without obstruction or gangrene: Secondary | ICD-10-CM

## 2020-10-03 NOTE — Patient Instructions (Signed)
Use abdominal wall binder  Take FDGard 1 capsule three times a day  Follow up in 3 months   [employer] Current Therapy 2021 (pp. 213-216). [place], [place]: Elsevier.">  Gastroesophageal Reflux Disease, Adult Gastroesophageal reflux (GER) happens when acid from the stomach flows up into the tube that connects the mouth and the stomach (esophagus). Normally, food travels down the esophagus and stays in the stomach to be digested. However, when a person has GER, food and stomach acid sometimes move back up into the esophagus. If this becomes a more serious problem, the person may be diagnosed with a disease called gastroesophageal reflux disease (GERD). GERD occurs when the reflux:  Happens often.  Causes frequent or severe symptoms.  Causes problems such as damage to the esophagus. When stomach acid comes in contact with the esophagus, the acid may cause inflammation in the esophagus. Over time, GERD may create small holes (ulcers) in the lining of the esophagus. What are the causes? This condition is caused by a problem with the muscle between the esophagus and the stomach (lower esophageal sphincter, or LES). Normally, the LES muscle closes after food passes through the esophagus to the stomach. When the LES is weakened or abnormal, it does not close properly, and that allows food and stomach acid to go back up into the esophagus. The LES can be weakened by certain dietary substances, medicines, and medical conditions, including:  Tobacco use.  Pregnancy.  Having a hiatal hernia.  Alcohol use.  Certain foods and beverages, such as coffee, chocolate, onions, and peppermint. What increases the risk? You are more likely to develop this condition if you:  Have an increased body weight.  Have a connective tissue disorder.  Take NSAIDs, such as ibuprofen. What are the signs or symptoms? Symptoms of this condition include:  Heartburn.  Difficult or painful swallowing and the feeling  of having a lump in the throat.  A bitter taste in the mouth.  Bad breath and having a large amount of saliva.  Having an upset or bloated stomach and belching.  Chest pain. Different conditions can cause chest pain. Make sure you see your health care provider if you experience chest pain.  Shortness of breath or wheezing.  Ongoing (chronic) cough or a nighttime cough.  Wearing away of tooth enamel.  Weight loss. How is this diagnosed? This condition may be diagnosed based on a medical history and a physical exam. To determine if you have mild or severe GERD, your health care provider may also monitor how you respond to treatment. You may also have tests, including:  A test to examine your stomach and esophagus with a small camera (endoscopy).  A test that measures the acidity level in your esophagus.  A test that measures how much pressure is on your esophagus.  A barium swallow or modified barium swallow test to show the shape, size, and functioning of your esophagus. How is this treated? Treatment for this condition may vary depending on how severe your symptoms are. Your health care provider may recommend:  Changes to your diet.  Medicine.  Surgery. The goal of treatment is to help relieve your symptoms and to prevent complications. Follow these instructions at home: Eating and drinking  Follow a diet as recommended by your health care provider. This may involve avoiding foods and drinks such as: ? Coffee and tea, with or without caffeine. ? Drinks that contain alcohol. ? Energy drinks and sports drinks. ? Carbonated drinks or sodas. ? Chocolate and  cocoa. ? Peppermint and mint flavorings. ? Garlic and onions. ? Horseradish. ? Spicy and acidic foods, including peppers, chili powder, curry powder, vinegar, hot sauces, and barbecue sauce. ? Citrus fruit juices and citrus fruits, such as oranges, lemons, and limes. ? Tomato-based foods, such as red sauce, chili,  salsa, and pizza with red sauce. ? Fried and fatty foods, such as donuts, french fries, potato chips, and high-fat dressings. ? High-fat meats, such as hot dogs and fatty cuts of red and white meats, such as rib eye steak, sausage, ham, and bacon. ? High-fat dairy items, such as whole milk, butter, and cream cheese.  Eat small, frequent meals instead of large meals.  Avoid drinking large amounts of liquid with your meals.  Avoid eating meals during the 2-3 hours before bedtime.  Avoid lying down right after you eat.  Do not exercise right after you eat.   Lifestyle  Do not use any products that contain nicotine or tobacco. These products include cigarettes, chewing tobacco, and vaping devices, such as e-cigarettes. If you need help quitting, ask your health care provider.  Try to reduce your stress by using methods such as yoga or meditation. If you need help reducing stress, ask your health care provider.  If you are overweight, reduce your weight to an amount that is healthy for you. Ask your health care provider for guidance about a safe weight loss goal.   General instructions  Pay attention to any changes in your symptoms.  Take over-the-counter and prescription medicines only as told by your health care provider. Do not take aspirin, ibuprofen, or other NSAIDs unless your health care provider told you to take these medicines.  Wear loose-fitting clothing. Do not wear anything tight around your waist that causes pressure on your abdomen.  Raise (elevate) the head of your bed about 6 inches (15 cm). You can use a wedge to do this.  Avoid bending over if this makes your symptoms worse.  Keep all follow-up visits. This is important. Contact a health care provider if:  You have: ? New symptoms. ? Unexplained weight loss. ? Difficulty swallowing or it hurts to swallow. ? Wheezing or a persistent cough. ? A hoarse voice.  Your symptoms do not improve with treatment. Get  help right away if:  You have sudden pain in your arms, neck, jaw, teeth, or back.  You suddenly feel sweaty, dizzy, or light-headed.  You have chest pain or shortness of breath.  You vomit and the vomit is green, yellow, or black, or it looks like blood or coffee grounds.  You faint.  You have stool that is red, bloody, or black.  You cannot swallow, drink, or eat. These symptoms may represent a serious problem that is an emergency. Do not wait to see if the symptoms will go away. Get medical help right away. Call your local emergency services (911 in the [place] Do not drive yourself to the hospital. Summary  Gastroesophageal reflux happens when acid from the stomach flows up into the esophagus. GERD is a disease in which the reflux happens often, causes frequent or severe symptoms, or causes problems such as damage to the esophagus.  Treatment for this condition may vary depending on how severe your symptoms are. Your health care provider may recommend diet and lifestyle changes, medicine, or surgery.  Contact a health care provider if you have new or worsening symptoms.  Take over-the-counter and prescription medicines only as told by your health care provider. Do  not take aspirin, ibuprofen, or other NSAIDs unless your health care provider told you to do so.  Keep all follow-up visits as told by your health care provider. This is important. This information is not intended to replace advice given to you by your health care provider. Make sure you discuss any questions you have with your health care provider. Document Revised: 10/30/2019 Document Reviewed: 10/30/2019 Elsevier Patient Education  [place].   If you are age 93 or older, your body mass index should be between 23-30. Your Body mass index is 29.21 kg/m. If this is out of the aforementioned range listed, please consider follow up with your Primary Care Provider.  If you are age 42 or younger, your body mass  index should be between 19-25. Your Body mass index is 29.21 kg/m. If this is out of the aformentioned range listed, please consider follow up with your Primary Care Provider.   __________________________________________________________  The [facility] providers would like to encourage you to use [facility] to communicate with providers for non-urgent requests or questions.  Due to long hold times on the telephone, sending your provider a message by [facility] may be a faster and more efficient way to get a response.  Please allow 48 business hours for a response.  Please remember that this is for non-urgent requests.   Due to recent changes in healthcare laws, you may see the results of your imaging and laboratory studies on MyChart before your provider has had a chance to review them.  We understand that in some cases there may be results that are confusing or concerning to you. Not all laboratory results come back in the same time frame and the provider may be waiting for multiple results in order to interpret others.  Please give [place] 48 hours in order for your provider to thoroughly review all the results before contacting the office for clarification of your results.   Thank you for choosing [facility]  Karleen Hampshire Nandigam,MD

## 2020-10-03 NOTE — Progress Notes (Signed)
Alan Ball    [id number]    Nov 06, 1950  Primary Care Physician:Greene, Ranell Patrick, MD  Referring Physician: [name], MD [address] [place],  [place] [address]   Chief complaint:  Abdominal bloating  HPI:  70 year old very pleasant gentleman here for follow-up visit for postprandial abdominal bloating. Last office visit by Alan Ball July 23, 2020 Complains of abdominal distention and bloating worse after dinner almost daily. Denies any recent changes in dietary habits.  No significant change in his symptoms with any particular diet. Denies constipation, melena, vomiting or rectal bleeding. He is worried about abdominal wall and umbilical hernia, wants to start exercising but is worried if it would make the hernia worse.  Colonoscopy 03/13/2019 with 3 5-11 mm polyps in the rectum and in the ascending colon, nonbleeding internal hemorrhoids and otherwise normal.  EGD on the same day with mucosal changes suspicious for short segment Barrett's, small hiatal hernia.  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  Repeat EGD and colonoscopy recommended in 3 years.  Outpatient Encounter Medications as of 10/03/2020  Medication Sig   albuterol (VENTOLIN HFA) 108 (90 Base) MCG/ACT inhaler Inhale 1-2 puffs into the lungs every 4 (four) hours as needed for wheezing or shortness of breath.   amLODipine (NORVASC) 5 MG tablet Take 1 tablet (5 mg total) by mouth daily.   atorvastatin (LIPITOR) 10 MG tablet Take 1 tablet (10 mg total) by mouth daily.   ibuprofen (ADVIL,MOTRIN) 200 MG tablet Take 200 mg by mouth every 6 (six) hours as needed for pain.   Multiple Vitamin (MULTIVITAMIN) tablet Take 1 tablet by mouth daily.   omeprazole (PRILOSEC) 20 MG capsule Take 1 capsule (20 mg total) by mouth in the morning and at bedtime. Take one tablet 20-30 minutes breakfast and dinner.   vitamin C (ASCORBIC ACID) 500 MG tablet Take 500 mg  by mouth daily.   [DISCONTINUED] carbidopa-levodopa (SINEMET IR) 25-100 MG tablet Take 1 tablet by mouth in the morning and at bedtime. (Patient not taking: Reported on 10/03/2020)   No facility-administered encounter medications on file as of 10/03/2020.    Allergies as of 10/03/2020   (No Known Allergies)    Past Medical History:  Diagnosis Date   Allergy    Asthma    Hyperlipemia    Hypertension    Tremor     Past Surgical History:  Procedure Laterality Date   CARPAL TUNNEL RELEASE Bilateral 2019, 2020   HERNIA REPAIR Bilateral    inguinal   SHOULDER SURGERY      Family History  Problem Relation Age of Onset   Hypertension Mother    Thyroid disease Mother    Cancer Father        brain tumor (gleoplastoma)   Diabetes Father    Colon polyps Father    Colon cancer Neg Hx    Stomach cancer Neg Hx    Rectal cancer Neg Hx    Esophageal cancer Neg Hx     Social History   Socioeconomic History   Marital status: Married    Spouse name: Alan Ball   Number of children: Not on file   Years of education: Not on file   Highest education level: Bachelor's degree (e.g., BA, AB, BS)  Occupational History    Comment: retired  Tobacco Use   Smoking status: Never Smoker   Smokeless tobacco: Never Used  Media planner  Vaping Use: Never used  Substance and Sexual Activity   Alcohol use: Yes    Comment: socially   Drug use: No   Sexual activity: Never  Other Topics Concern   Not on file  Social History Narrative   Lives with wife   Caffeine- coffee 3 cups daily, maybe a soda   Social Determinants of Health   Financial Resource Strain: Not on file  Food Insecurity: Not on file  Transportation Needs: Not on file  Physical Activity: Not on file  Stress: Not on file  Social Connections: Not on file  Intimate Partner Violence: Not on file      Review of systems: All other review of systems negative except as mentioned in the HPI.   Physical Exam: Vitals:   10/03/20  1315  BP: 138/82  Pulse: 84  SpO2: 98%   Body mass index is 29.21 kg/m. Gen:      No acute distress HEENT:  sclera anicteric Abd:      soft, non-tender; no palpable masses, no distension.  Small umbilical hernia and ventral abdominal wall hernia in midline Ext:    No edema Neuro: alert and oriented x 3 Psych: normal mood and affect  Data Reviewed:  Reviewed labs, radiology imaging, old records and pertinent past GI work up   Assessment and Plan/Recommendations:  70 year old very pleasant gentleman with complaints of abdominal distention and dyspepsia History of chronic gastritis, Barrett's esophagus with intestinal metaplasia, negative for dysplasia.  He is due for surveillance EGD in 2023 Continue omeprazole 20 mg daily Antireflux measures Small meals Trial of lactose-free diet Use FD guard 1 capsule up to 3 times daily as needed  History of colon polyps, due for surveillance colonoscopy November 2023  Abdominal wall hernia: Use abdominal wall binder with any activity or exercise  Return in 3 months or sooner if needed  This visit required >40 minutes of patient care (this includes precharting, chart review, review of results, face-to-face time used for counseling as well as treatment plan and follow-up. The patient was provided an opportunity to ask questions and all were answered. The patient agreed with the plan and demonstrated an understanding of the instructions.  Alan Ball , MD    CC: [name], MD

## 2020-10-10 ENCOUNTER — Encounter: Payer: Self-pay | Admitting: Gastroenterology

## 2020-12-04 ENCOUNTER — Ambulatory Visit: Payer: Medicare HMO | Admitting: Family Medicine

## 2021-01-03 IMAGING — NM NM DATSCAN
5 series · 31 of 40 positions shown · non-contrast
Comparison: 01/03/2019
COMPARISON: 01/03/2019

Addendum:
CLINICAL DATA: Pt c/o tremor of left hand at rest. He states tremor
seems to improve with activity. Has not noticed tremor elsewhere.
Occasionally he has felt as if he is leaning forward and a bit
unbalanced at times. He has not noticed a shuffling gait or short
steps. No falls. No concerns with memory or facial changes. No
concerns of depression or anxiety. No changes in voice or trouble
swallowing. He does have residual numbness of left thumb and first
two digits s/p carpal tunnel release.

EXAM:
NUCLEAR MEDICINE BRAIN IMAGING WITH SPECT  (DaTscan )
TECHNIQUE: SPECT images of the brain were obtained after intravenous injection
of radiopharmaceutical. 4 hour post injection imaging. Appropriate
positioning. 0.8 ml lugols solution administered in a.m
RADIOPHARMACEUTICALS:  4.5 millicuries I 123 Ioflupane

[Series 1: brain spect · 4.14mm/px · 5 of 120 frames shown]
[frame 11/120  full-range]
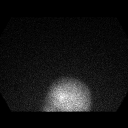
[frame 31/120  full-range]
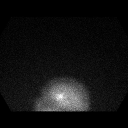
[frame 71/120  full-range]
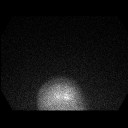
[frame 91/120  full-range]
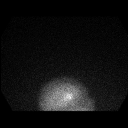
[frame 111/120  full-range]
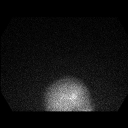

[Series 1: spect - (id) _(id)_cor · 4.1mm · 4.14mm/px · 4 of 128 frames shown]
[frame 32/128]
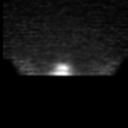
[frame 54/128]
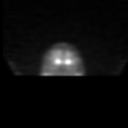
[frame 75/128]
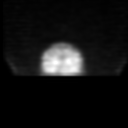
[frame 96/128]
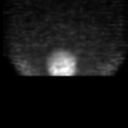

[Series 1: spect - (id) _(id) · 4.1mm · 4.14mm/px · 5 of 128 frames shown]
[frame 11/128]
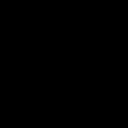
[frame 32/128]
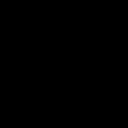
[frame 54/128]
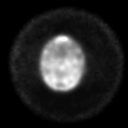
[frame 96/128]
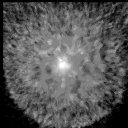
[frame 118/128]
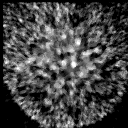

[Series 1016: mpr tra (id) range · 0.75mm/px · 8 of 40 slices shown]
[im 1/40]
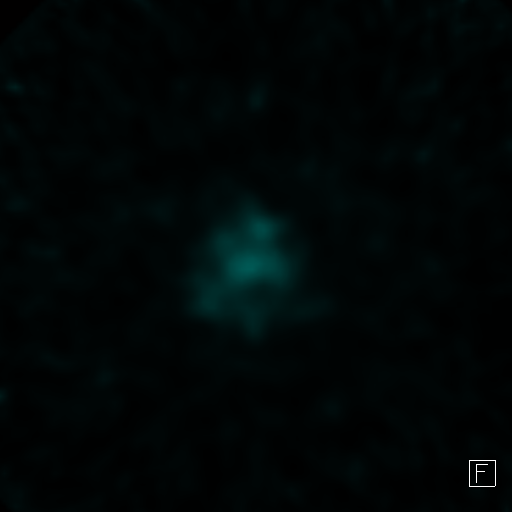
[im 9/40]
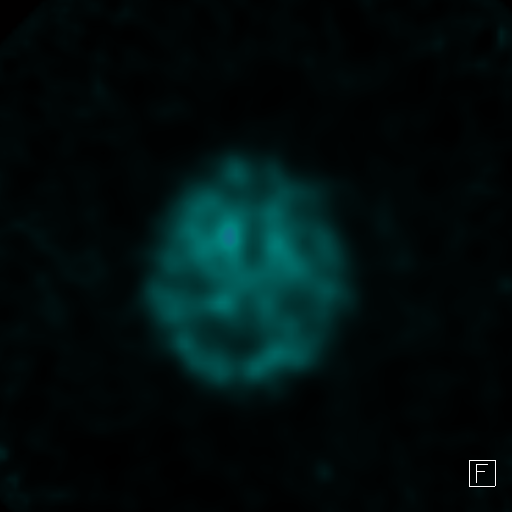
[im 14/40]
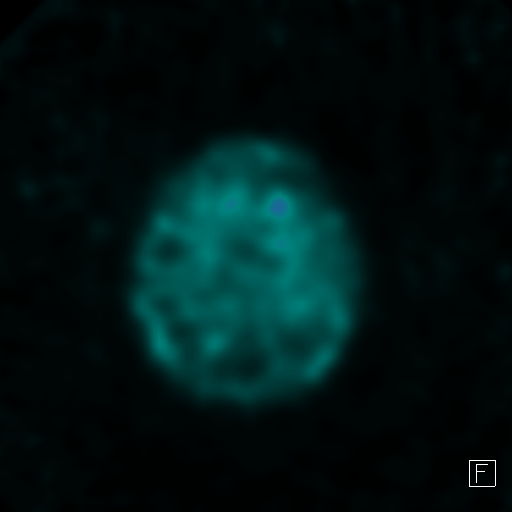
[im 18/40]
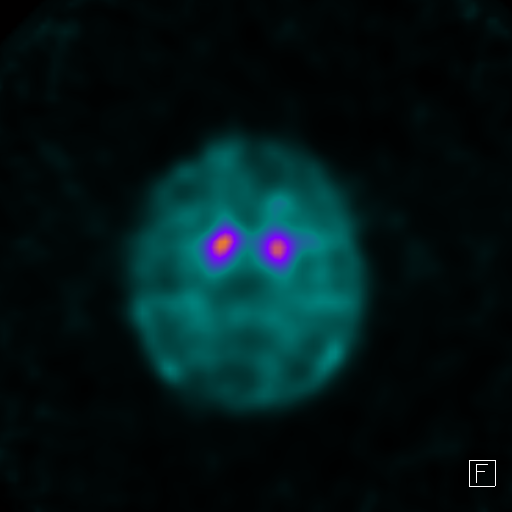
[im 22/40]
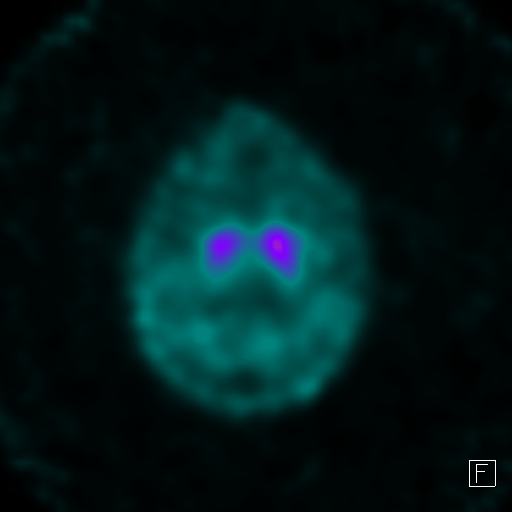
[im 31/40]
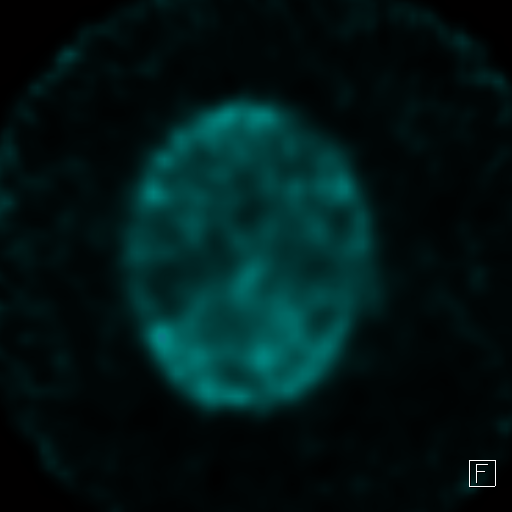
[im 35/40]
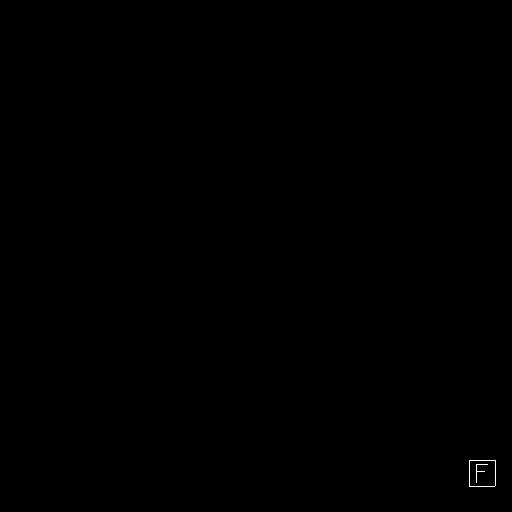
[im 40/40]
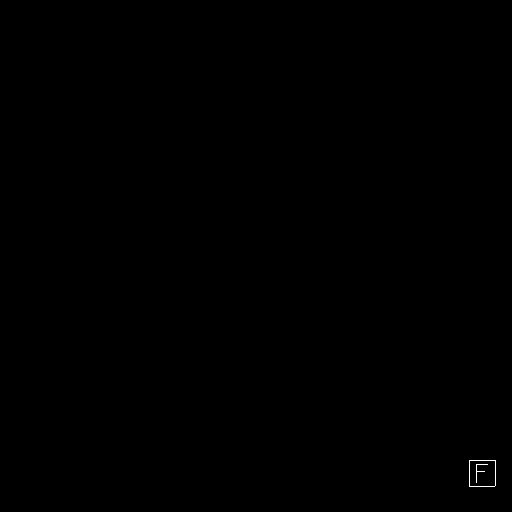

[Series 1033: mpr (id) range · 0.73mm/px · 9 of 46 slices shown]
[im 5/46]
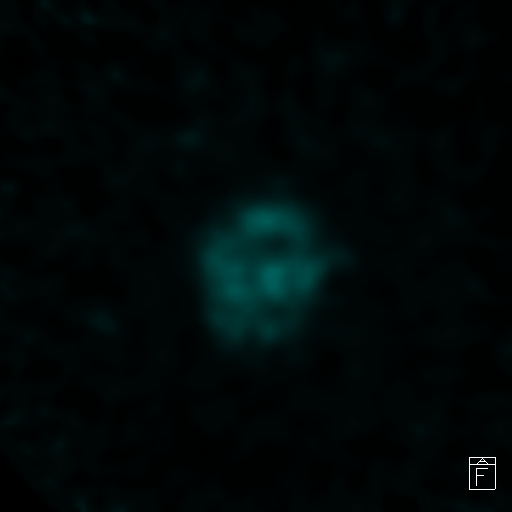
[im 9/46]
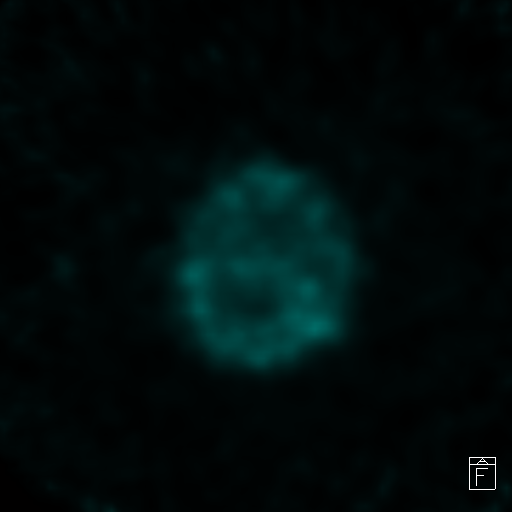
[im 13/46]
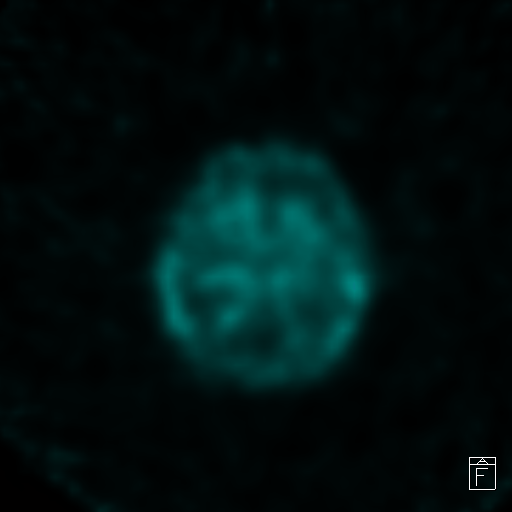
[im 17/46]
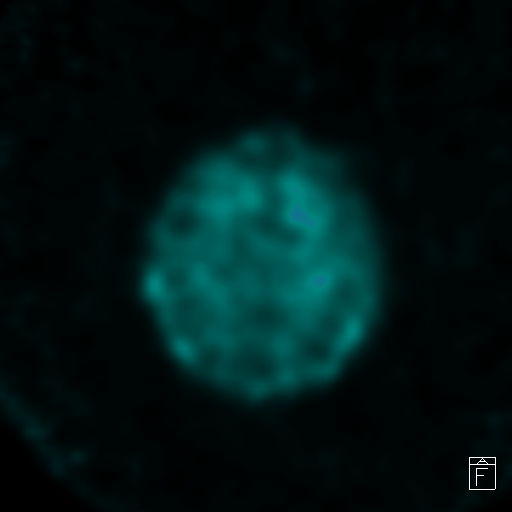
[im 25/46]
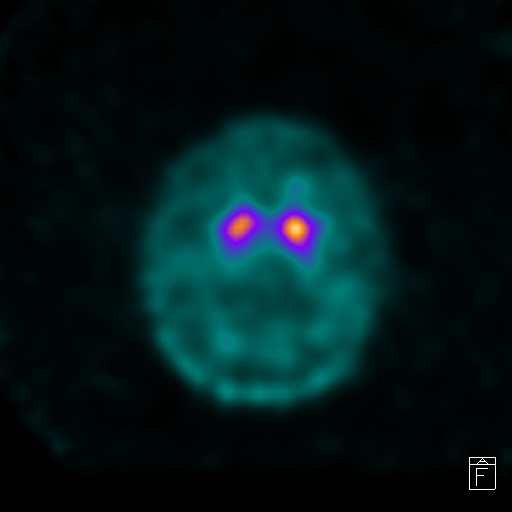
[im 29/46]
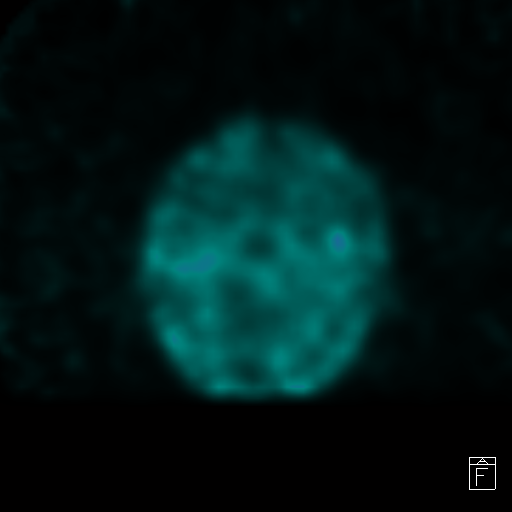
[im 33/46]
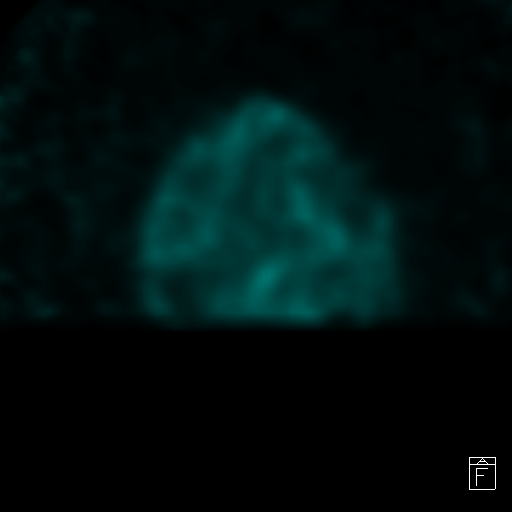
[im 41/46]
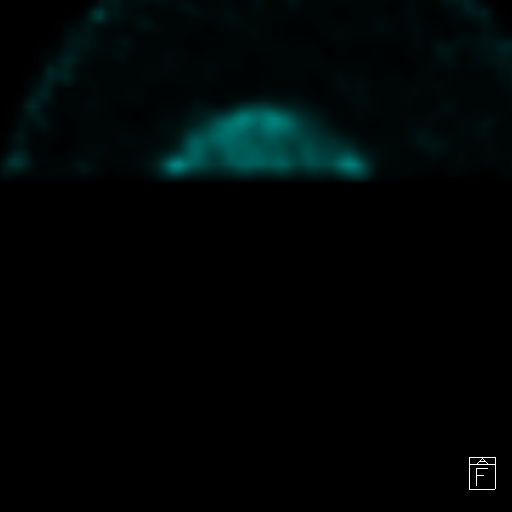
[im 46/46]
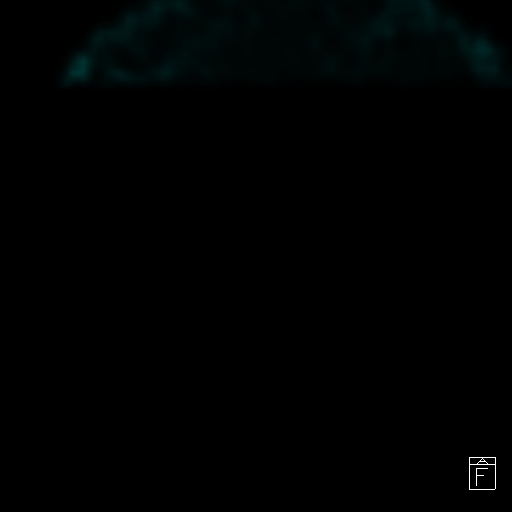

[31 of 40 positions shown; findings below may reference images not displayed]

FINDINGS: Symmetric intense uptake within LEFT and RIGHT striata. The heads of
the caudate nuclei and the posterior striata (putamen) are normal
shape. No evidence of loss of dopamine transport populations in the
basal ganglia.
IMPRESSION: DatScan (Ioflupane) scan within normal limits. Pattern NOT typical
of Parkinson's syndrome pathology

ADDENDUM:
In re examination of daTscan and in discussion with Dr. Leoncie
addendum as follows:

While there is robust activity within the heads of the caudate
nuclei, the RIGHT head of caudate nuclei has slightly less relative
radiotracer activity than the LEFT. Additionally there may symmetric
truncation of the posterior margins of the LEFT and RIGHT putamen.
Again RIGHT slightly greater than LEFT.

Overall findings indicate potential decrease in the posterior
striata and RIGHT head of caudate nuclei. The scan remains EQUIVOCAL
and recommend clinical correlation for Parkinson's syndrome
pathology. A repeat Ioflupane scan (DaTscan) may be beneficial in
the future.

*** End of Addendum ***
FINDINGS: Symmetric intense uptake within LEFT and RIGHT striata. The heads of
the caudate nuclei and the posterior striata (putamen) are normal
shape. No evidence of loss of dopamine transport populations in the
basal ganglia.
IMPRESSION: DatScan (Ioflupane) scan within normal limits. Pattern NOT typical
of Parkinson's syndrome pathology

## 2021-01-26 ENCOUNTER — Other Ambulatory Visit: Payer: Self-pay | Admitting: Family Medicine

## 2021-01-26 DIAGNOSIS — E785 Hyperlipidemia, unspecified: Secondary | ICD-10-CM

## 2021-03-17 ENCOUNTER — Other Ambulatory Visit: Payer: Self-pay | Admitting: Family Medicine

## 2021-03-17 DIAGNOSIS — I10 Essential (primary) hypertension: Secondary | ICD-10-CM

## 2021-04-02 DIAGNOSIS — Z79899 Other long term (current) drug therapy: Secondary | ICD-10-CM | POA: Diagnosis not present

## 2021-04-02 DIAGNOSIS — G20 Parkinson's disease: Secondary | ICD-10-CM | POA: Diagnosis not present

## 2021-04-03 ENCOUNTER — Telehealth: Payer: Self-pay | Admitting: Family Medicine

## 2021-04-24 ENCOUNTER — Other Ambulatory Visit: Payer: Self-pay | Admitting: Family Medicine

## 2021-04-24 DIAGNOSIS — E785 Hyperlipidemia, unspecified: Secondary | ICD-10-CM

## 2021-04-30 ENCOUNTER — Other Ambulatory Visit: Payer: Self-pay | Admitting: Family Medicine

## 2021-04-30 ENCOUNTER — Encounter: Payer: Self-pay | Admitting: Family Medicine

## 2021-04-30 DIAGNOSIS — Z20828 Contact with and (suspected) exposure to other viral communicable diseases: Secondary | ICD-10-CM

## 2021-04-30 MED ORDER — OSELTAMIVIR PHOSPHATE 75 MG PO CAPS: 75.0000 mg | ORAL_CAPSULE | Freq: Every day | ORAL | 0 refills | Status: DC

## 2021-04-30 NOTE — Progress Notes (Signed)
Tamiflu ordered due to exposure to influenza, MyChart message sent.

## 2021-05-29 ENCOUNTER — Telehealth: Payer: Self-pay | Admitting: Family Medicine

## 2021-05-29 NOTE — Telephone Encounter (Signed)
Left message for patient to call back and schedule Medicare Annual Wellness Visit (AWV) in office.   If not able to come in office, please offer to do virtually or by telephone.  Left office number and my jabber [phone].  Last AWV:04/04/2020  Please schedule at anytime with Nurse Health Advisor.

## 2021-06-14 ENCOUNTER — Other Ambulatory Visit: Payer: Self-pay | Admitting: Family Medicine

## 2021-06-14 DIAGNOSIS — I10 Essential (primary) hypertension: Secondary | ICD-10-CM

## 2021-06-30 ENCOUNTER — Other Ambulatory Visit: Payer: Self-pay | Admitting: Physician Assistant

## 2021-06-30 DIAGNOSIS — K219 Gastro-esophageal reflux disease without esophagitis: Secondary | ICD-10-CM

## 2021-06-30 NOTE — Telephone Encounter (Signed)
Patient needs an office visit.  

## 2021-07-09 DIAGNOSIS — R972 Elevated prostate specific antigen [PSA]: Secondary | ICD-10-CM | POA: Diagnosis not present

## 2021-07-16 ENCOUNTER — Ambulatory Visit (INDEPENDENT_AMBULATORY_CARE_PROVIDER_SITE_OTHER): Payer: Medicare HMO | Admitting: Family Medicine

## 2021-07-16 VITALS — BP 134/68 | HR 83 | Temp 98.1°F | Resp 17 | Ht 67.0 in | Wt 187.8 lb

## 2021-07-16 DIAGNOSIS — R351 Nocturia: Secondary | ICD-10-CM | POA: Diagnosis not present

## 2021-07-16 DIAGNOSIS — I10 Essential (primary) hypertension: Secondary | ICD-10-CM

## 2021-07-16 DIAGNOSIS — J4520 Mild intermittent asthma, uncomplicated: Secondary | ICD-10-CM

## 2021-07-16 DIAGNOSIS — R251 Tremor, unspecified: Secondary | ICD-10-CM

## 2021-07-16 DIAGNOSIS — N401 Enlarged prostate with lower urinary tract symptoms: Secondary | ICD-10-CM | POA: Diagnosis not present

## 2021-07-16 DIAGNOSIS — R972 Elevated prostate specific antigen [PSA]: Secondary | ICD-10-CM | POA: Diagnosis not present

## 2021-07-16 DIAGNOSIS — K219 Gastro-esophageal reflux disease without esophagitis: Secondary | ICD-10-CM | POA: Diagnosis not present

## 2021-07-16 DIAGNOSIS — E785 Hyperlipidemia, unspecified: Secondary | ICD-10-CM

## 2021-07-16 DIAGNOSIS — J9801 Acute bronchospasm: Secondary | ICD-10-CM

## 2021-07-16 DIAGNOSIS — R3915 Urgency of urination: Secondary | ICD-10-CM | POA: Diagnosis not present

## 2021-07-16 DIAGNOSIS — N5201 Erectile dysfunction due to arterial insufficiency: Secondary | ICD-10-CM | POA: Diagnosis not present

## 2021-07-16 LAB — COMPREHENSIVE METABOLIC PANEL
ALT: 8 U/L (ref 0–53)
AST: 25 U/L (ref 0–37)
Albumin: 4.6 g/dL (ref 3.5–5.2)
Alkaline Phosphatase: 68 U/L (ref 39–117)
BUN: 16 mg/dL (ref 6–23)
CO2: 27 mEq/L (ref 19–32)
Calcium: 9.7 mg/dL (ref 8.4–10.5)
Chloride: 103 mEq/L (ref 96–112)
Creatinine, Ser: 1.04 mg/dL (ref 0.40–1.50)
GFR: 71.67 mL/min (ref >60.00)
Glucose, Bld: 104 mg/dL — ABNORMAL HIGH (ref 70–99)
Potassium: 4.2 mEq/L (ref 3.5–5.1)
Sodium: 137 mEq/L (ref 135–145)
Total Bilirubin: 0.7 mg/dL (ref 0.2–1.2)
Total Protein: 7 g/dL (ref 6.0–8.3)

## 2021-07-16 LAB — LIPID PANEL
Cholesterol: 168 mg/dL (ref 0–200)
HDL: 56.6 mg/dL (ref >39.00)
LDL Cholesterol: 92 mg/dL (ref 0–99)
NonHDL: 111.47
Total CHOL/HDL Ratio: 3
Triglycerides: 99 mg/dL (ref 0.0–149.0)
VLDL: 19.8 mg/dL (ref 0.0–40.0)

## 2021-07-16 MED ORDER — ALBUTEROL SULFATE HFA 108 (90 BASE) MCG/ACT IN AERS: 1.0000 | INHALATION_SPRAY | RESPIRATORY_TRACT | 0 refills | Status: DC | PRN

## 2021-07-16 MED ORDER — ATORVASTATIN CALCIUM 10 MG PO TABS: 10.0000 mg | ORAL_TABLET | Freq: Every day | ORAL | 1 refills | Status: DC

## 2021-07-16 MED ORDER — OMEPRAZOLE 20 MG PO CPDR: 20.0000 mg | DELAYED_RELEASE_CAPSULE | Freq: Every day | ORAL | 1 refills | Status: DC

## 2021-07-16 MED ORDER — AMLODIPINE BESYLATE 5 MG PO TABS: 5.0000 mg | ORAL_TABLET | Freq: Every day | ORAL | 1 refills | Status: DC

## 2021-07-16 NOTE — Patient Instructions (Addendum)
You may be due for repeat endoscopy this year - call Dr. Woodward Ku office to discuss.continue omeprazole daily.  ?Follow up with Dr. Rolena Infante as planned, but follow up with me if needed as well for these areas.  ?No change in meds for now.  ?Let me know if there are questions. Thanks for coming in today.  ? ? ?

## 2021-07-16 NOTE — Progress Notes (Signed)
? ?Subjective:  ?Patient ID: Alan Ball, male    DOB: Oct 13, 1950  Age: 71 y.o. MRN: [id number] ? ?CC:  ?Chief Complaint  ?Patient presents with  ? Gastroesophageal Reflux  ?  Pt has been doing well no issues has been taking 1 daily and has been well controlled   ? Hyperlipidemia  ?  Pt has been doing okay, has been trying to walk but no other changes to diet or exercise   ? Hypertension  ?  Pt has not taken home BP in some time denies physical sxs   ? ? ?HPI ?Alan Ball presents for  ? ?Hypertension: ?Amlodipine 5 mg daily,  no new side effects or leg swelling.  ?Home readings: none.  ?BP Readings from Last 3 Encounters:  ?07/16/21 134/68  ?10/03/20 138/82  ?07/23/20 (!) 146/76  ? ?Lab Results  ?Component Value Date  ? CREATININE 0.95 06/06/2020  ? ?Hyperlipidemia: ?Lipitor 10 mg daily, no recent diet changes.  Walking for exercise - everyday. Active with 67 year old granddaughter and 81 week granddaughter ?No new myalgias. Son with twin granddaughters on the way.  ?Lab Results  ?Component Value Date  ? CHOL 169 06/06/2020  ? HDL 54 06/06/2020  ? LDLCALC 102 (H) 06/06/2020  ? TRIG 70 06/06/2020  ? CHOLHDL 3.1 06/06/2020  ? ?Lab Results  ?Component Value Date  ? ALT 23 06/06/2020  ? AST 30 06/06/2020  ? ALKPHOS 90 06/06/2020  ? BILITOT 0.7 06/06/2020  ? ?GERD ?Omeprazole 20 mg daily has been controlling symptoms. No hx of PUD. Evening indigestion resolved. GI: Dr. Silverio Decamp. Note form 10/2020: ?chronic gastritis, Barrett's esophagus with intestinal metaplasia, negative for dysplasia.  He is due for surveillance EGD in 2023.  ? ?Appt. with Dr. Jeffie Pollock this afternoon.  ? ?Parkinsonism ?Reviewed last neurology note from [address].  April 02, 2021.  Tremor appear to be getting worse.  Unsure of benefit of Sinemet.  Option to increase dose and max out Sinemet.  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  Option to add Artane and/or dopamine agonist.  Exercise discussed.  Plan for follow-up with Dr. Hall Busing in 6  months. ?Only on sinemet in am. 2 in am - more tremor in am.  ?Some neck pain at times - plans to follow up with Dr. Rolena Infante.  ? ?Mild intermittent asthma: ?Albuterol as needed only.  Has been overall controlled with infrequent use, typically dust or hay and flare symptoms.  Needs refill. ?Rare use in winter - 2-3 times per month.not usually during winter.  ?   ?Declined cyst removal with prior surgeon Dr. Johney Maine. Not interested in procedure at this time. Will let me know if requests to see other surgeon.             ?History ?Patient Active Problem List  ? Diagnosis Date Noted  ? Mass of soft tissue of left buttock 07/29/2020  ? Umbilical hernia 93/81/0175  ? Gastritis 07/29/2020  ? Parkinsonism ([place]) 10/11/2019  ? Cervical radiculopathy 02/21/2019  ? DDD (degenerative disc disease), cervical 02/21/2019  ? Bilateral carpal tunnel syndrome 05/19/2018  ? Trigger middle finger of right hand 12/20/2017  ? De Quervain's tenosynovitis, right 12/20/2017  ? Carpal tunnel syndrome of right wrist 12/20/2017  ? Renal cyst, right 09/05/2015  ? Inguinal hernia 10/04/2012  ? ?Past Medical History:  ?Diagnosis Date  ? Allergy   ? Asthma   ? Hyperlipemia   ? Hypertension   ? Tremor   ? ?Past Surgical History:  ?  Procedure Laterality Date  ? CARPAL TUNNEL RELEASE Bilateral 2019, 2020  ? HERNIA REPAIR Bilateral   ? inguinal  ? SHOULDER SURGERY    ? ?No Known Allergies ?Prior to Admission medications   ?Medication Sig Start Date End Date Taking? Authorizing Provider  ?albuterol (VENTOLIN HFA) 108 (90 Base) MCG/ACT inhaler Inhale 1-2 puffs into the lungs every 4 (four) hours as needed for wheezing or shortness of breath. 01/10/20  Yes [name], MD  ?amLODipine (NORVASC) 5 MG tablet TAKE ONE TABLET BY MOUTH ONE TIME DAILY 06/16/21  Yes [name], MD  ?atorvastatin (LIPITOR) 10 MG tablet TAKE ONE TABLET BY MOUTH ONE TIME DAILY 04/24/21  Yes [name], MD  ?Carbidopa-Levodopa ER (SINEMET CR) 25-100 MG tablet controlled  release Take 1 tablet by mouth daily. Notes he does not take this daily 04/02/21  Yes [provider]  ?ibuprofen (ADVIL,MOTRIN) 200 MG tablet Take 200 mg by mouth every 6 (six) hours as needed for pain.   Yes [provider]  ?Multiple Vitamin (MULTIVITAMIN) tablet Take 1 tablet by mouth daily.   Yes [provider]  ?omeprazole (PRILOSEC) 20 MG capsule Take 1 capsule (20 mg total) by mouth 2 (two) times daily before a meal. PT needs office visit. 06/30/21  Yes Levin Erp, PA  ?vitamin C (ASCORBIC ACID) 500 MG tablet Take 500 mg by mouth daily.   Yes [provider]  ?sildenafil (VIAGRA) 50 MG tablet Take 1 tablet by mouth daily as needed.    [provider]  ? ?Social History  ? ?Socioeconomic History  ? Marital status: Married  ?  Spouse name: Romie Minus  ? Number of children: Not on file  ? Years of education: Not on file  ? Highest education level: Bachelor's degree (e.g., BA, AB, BS)  ?Occupational History  ?  Comment: retired  ?Tobacco Use  ? Smoking status: Never  ? Smokeless tobacco: Never  ?Vaping Use  ? Vaping Use: Never used  ?Substance and Sexual Activity  ? Alcohol use: Yes  ?  Comment: socially  ? Drug use: No  ? Sexual activity: Never  ?Other Topics Concern  ? Not on file  ?Social History Narrative  ? Lives with wife  ? Caffeine- coffee 3 cups daily, maybe a soda  ? ?Social Determinants of Health  ? ?Financial Resource Strain: Not on file  ?Food Insecurity: Not on file  ?Transportation Needs: Not on file  ?Physical Activity: Not on file  ?Stress: Not on file  ?Social Connections: Not on file  ?Intimate Partner Violence: Not on file  ? ? ?Review of Systems  ?Constitutional:  Negative for fatigue and unexpected weight change.  ?Eyes:  Negative for visual disturbance.  ?Respiratory:  Negative for cough, chest tightness and shortness of breath.   ?Cardiovascular:  Negative for chest pain, palpitations and leg swelling.  ?Gastrointestinal:  Negative for  abdominal pain and blood in stool.  ?Neurological:  Negative for dizziness, light-headedness and headaches.  ? ? ?Objective:  ? ?Vitals:  ? 07/16/21 0904  ?BP: 134/68  ?Pulse: 83  ?Resp: 17  ?Temp: 98.1 ?F (36.7 ?C)  ?TempSrc: Temporal  ?SpO2: 94%  ?Weight: 187 lb 12.8 oz (85.2 kg)  ?Height: '5\' 7"'$  (1.702 m)  ? ? ?Physical Exam ?Vitals reviewed.  ?Constitutional:   ?   Appearance: He is well-developed.  ?HENT:  ?   Head: Normocephalic and atraumatic.  ?Neck:  ?   Vascular: No carotid bruit or JVD.  ?  Cardiovascular:  ?   Rate and Rhythm: Normal rate and regular rhythm.  ?   Heart sounds: Normal heart sounds. No murmur heard. ?Pulmonary:  ?   Effort: Pulmonary effort is normal.  ?   Breath sounds: Normal breath sounds. No rales.  ?Musculoskeletal:  ?   Right lower leg: No edema.  ?   Left lower leg: No edema.  ?Skin: ?   General: Skin is warm and dry.  ?Neurological:  ?   Mental Status: He is alert and oriented to person, place, and time.  ?   Comments: Resting tremor of left hand.  ?Psychiatric:     ?   Mood and Affect: Mood normal.  ? ? ?Assessment & Plan:  ?Alan Ball is a 71 y.o. male . ?Mild intermittent asthma without complication ? -Albuterol refilled if needed, rare use.  Overall stable.  RTC precautions given. ? ?Hyperlipidemia, unspecified hyperlipidemia type - Plan: atorvastatin (LIPITOR) 10 MG tablet, Lipid panel, Comprehensive metabolic panel ? -  Stable, tolerating current regimen. Medications refilled. Labs pending as above.  ? ?Essential hypertension - Plan: amLODipine (NORVASC) 5 MG tablet ? -  Stable, tolerating current regimen. Medications refilled. Labs pending as above.  ? ?Gastroesophageal reflux disease, unspecified whether esophagitis present - Plan: omeprazole (PRILOSEC) 20 MG capsule ? -Chronic gastritis, Barrett's by history.  Due for EGD this year.  Stable symptoms on omeprazole daily.  Continue same, follow-up with GI. ? ?Tremor ? -On Sinemet as above routine follow-up with neurology.   Plans to discuss his neck, and issues further with his orthopedist but RTC precautions discussed if further eval needed with me.  ? ?Declined new referral for surgeon for cyst on buttocks. ? ?Meds order

## 2021-09-02 DIAGNOSIS — M1712 Unilateral primary osteoarthritis, left knee: Secondary | ICD-10-CM | POA: Diagnosis not present

## 2021-09-09 DIAGNOSIS — H2511 Age-related nuclear cataract, right eye: Secondary | ICD-10-CM | POA: Diagnosis not present

## 2021-09-09 DIAGNOSIS — H2512 Age-related nuclear cataract, left eye: Secondary | ICD-10-CM | POA: Diagnosis not present

## 2021-09-10 ENCOUNTER — Encounter: Payer: Self-pay | Admitting: Family Medicine

## 2021-10-02 DIAGNOSIS — H2512 Age-related nuclear cataract, left eye: Secondary | ICD-10-CM | POA: Diagnosis not present

## 2021-10-02 DIAGNOSIS — H2511 Age-related nuclear cataract, right eye: Secondary | ICD-10-CM | POA: Diagnosis not present

## 2021-10-02 DIAGNOSIS — I10 Essential (primary) hypertension: Secondary | ICD-10-CM | POA: Diagnosis not present

## 2021-10-02 DIAGNOSIS — H269 Unspecified cataract: Secondary | ICD-10-CM | POA: Diagnosis not present

## 2021-10-02 DIAGNOSIS — Z01818 Encounter for other preprocedural examination: Secondary | ICD-10-CM | POA: Diagnosis not present

## 2021-10-30 DIAGNOSIS — H2512 Age-related nuclear cataract, left eye: Secondary | ICD-10-CM | POA: Diagnosis not present

## 2021-10-30 DIAGNOSIS — H269 Unspecified cataract: Secondary | ICD-10-CM | POA: Diagnosis not present

## 2021-11-26 ENCOUNTER — Ambulatory Visit (INDEPENDENT_AMBULATORY_CARE_PROVIDER_SITE_OTHER): Payer: Medicare HMO

## 2021-11-26 DIAGNOSIS — Z0000 Encounter for general adult medical examination without abnormal findings: Secondary | ICD-10-CM

## 2021-11-26 NOTE — Patient Instructions (Signed)
Alan Ball , Thank you for taking time to come for your Medicare Wellness Visit. I appreciate your ongoing commitment to your health goals. Please review the following plan we discussed and let me know if I can assist you in the future.   Screening recommendations/referrals: Colonoscopy: 03/13/2019  due 2023 will discuss with PCP  Recommended yearly ophthalmology/optometry visit for glaucoma screening and checkup Recommended yearly dental visit for hygiene and checkup  Vaccinations: Influenza vaccine: completed  Pneumococcal vaccine: completed  Tdap vaccine: 09/08/2012 Shingles vaccine: completed     Advanced directives: yes   Conditions/risks identified: none   Next appointment: none   Preventive Care 73 Years and Older, Male Preventive care refers to lifestyle choices and visits with your health care provider that can promote health and wellness. What does preventive care include? A yearly physical exam. This is also called an annual well check. Dental exams once or twice a year. Routine eye exams. Ask your health care provider how often you should have your eyes checked. Personal lifestyle choices, including: Daily care of your teeth and gums. Regular physical activity. Eating a healthy diet. Avoiding tobacco and drug use. Limiting alcohol use. Practicing safe sex. Taking low doses of aspirin every day. Taking vitamin and mineral supplements as recommended by your health care provider. What happens during an annual well check? The services and screenings done by your health care provider during your annual well check will depend on your age, overall health, lifestyle risk factors, and family history of disease. Counseling  Your health care provider may ask you questions about your: Alcohol use. Tobacco use. Drug use. Emotional well-being. Home and relationship well-being. Sexual activity. Eating habits. History of falls. Memory and ability to understand  (cognition). Work and work Statistician. Screening  You may have the following tests or measurements: Height, weight, and BMI. Blood pressure. Lipid and cholesterol levels. These may be checked every 5 years, or more frequently if you are over 57 years old. Skin check. Lung cancer screening. You may have this screening every year starting at age 18 if you have a 30-pack-year history of smoking and currently smoke or have quit within the past 15 years. Fecal occult blood test (FOBT) of the stool. You may have this test every year starting at age 31. Flexible sigmoidoscopy or colonoscopy. You may have a sigmoidoscopy every 5 years or a colonoscopy every 10 years starting at age 24. Prostate cancer screening. Recommendations will vary depending on your family history and other risks. Hepatitis C blood test. Hepatitis B blood test. Sexually transmitted disease (STD) testing. Diabetes screening. This is done by checking your blood sugar (glucose) after you have not eaten for a while (fasting). You may have this done every 1-3 years. Abdominal aortic aneurysm (AAA) screening. You may need this if you are a current or former smoker. Osteoporosis. You may be screened starting at age 65 if you are at high risk. Talk with your health care provider about your test results, treatment options, and if necessary, the need for more tests. Vaccines  Your health care provider may recommend certain vaccines, such as: Influenza vaccine. This is recommended every year. Tetanus, diphtheria, and acellular pertussis (Tdap, Td) vaccine. You may need a Td booster every 10 years. Zoster vaccine. You may need this after age 33. Pneumococcal 13-valent conjugate (PCV13) vaccine. One dose is recommended after age 37. Pneumococcal polysaccharide (PPSV23) vaccine. One dose is recommended after age 69. Talk to your health care provider about which screenings and  vaccines you need and how often you need them. This  information is not intended to replace advice given to you by your health care provider. Make sure you discuss any questions you have with your health care provider. Document Released: 05/17/2015 Document Revised: 01/08/2016 Document Reviewed: 02/19/2015 Elsevier Interactive Patient Education  2017 [employer] Prevention in the Home Falls can cause injuries. They can happen to people of all ages. There are many things you can do to make your home safe and to help prevent falls. What can I do on the outside of my home? Regularly fix the edges of walkways and driveways and fix any cracks. Remove anything that might make you trip as you walk through a door, such as a raised step or threshold. Trim any bushes or trees on the path to your home. Use bright outdoor lighting. Clear any walking paths of anything that might make someone trip, such as rocks or tools. Regularly check to see if handrails are loose or broken. Make sure that both sides of any steps have handrails. Any raised decks and porches should have guardrails on the edges. Have any leaves, snow, or ice cleared regularly. Use sand or salt on walking paths during winter. Clean up any spills in your garage right away. This includes oil or grease spills. What can I do in the bathroom? Use night lights. Install grab bars by the toilet and in the tub and shower. Do not use towel bars as grab bars. Use non-skid mats or decals in the tub or shower. If you need to sit down in the shower, use a plastic, non-slip stool. Keep the floor dry. Clean up any water that spills on the floor as soon as it happens. Remove soap buildup in the tub or shower regularly. Attach bath mats securely with double-sided non-slip rug tape. Do not have throw rugs and other things on the floor that can make you trip. What can I do in the bedroom? Use night lights. Make sure that you have a light by your bed that is easy to reach. Do not use any sheets or  blankets that are too big for your bed. They should not hang down onto the floor. Have a firm chair that has side arms. You can use this for support while you get dressed. Do not have throw rugs and other things on the floor that can make you trip. What can I do in the kitchen? Clean up any spills right away. Avoid walking on wet floors. Keep items that you use a lot in easy-to-reach places. If you need to reach something above you, use a strong step stool that has a grab bar. Keep electrical cords out of the way. Do not use floor polish or wax that makes floors slippery. If you must use wax, use non-skid floor wax. Do not have throw rugs and other things on the floor that can make you trip. What can I do with my stairs? Do not leave any items on the stairs. Make sure that there are handrails on both sides of the stairs and use them. Fix handrails that are broken or loose. Make sure that handrails are as long as the stairways. Check any carpeting to make sure that it is firmly attached to the stairs. Fix any carpet that is loose or worn. Avoid having throw rugs at the top or bottom of the stairs. If you do have throw rugs, attach them to the floor with carpet tape. Make  sure that you have a light switch at the top of the stairs and the bottom of the stairs. If you do not have them, ask someone to add them for you. What else can I do to help prevent falls? Wear shoes that: Do not have high heels. Have rubber bottoms. Are comfortable and fit you well. Are closed at the toe. Do not wear sandals. If you use a stepladder: Make sure that it is fully opened. Do not climb a closed stepladder. Make sure that both sides of the stepladder are locked into place. Ask someone to hold it for you, if possible. Clearly mark and make sure that you can see: Any grab bars or handrails. First and last steps. Where the edge of each step is. Use tools that help you move around (mobility aids) if they are  needed. These include: Canes. Walkers. Scooters. Crutches. Turn on the lights when you go into a dark area. Replace any light bulbs as soon as they burn out. Set up your furniture so you have a clear path. Avoid moving your furniture around. If any of your floors are uneven, fix them. If there are any pets around you, be aware of where they are. Review your medicines with your doctor. Some medicines can make you feel dizzy. This can increase your chance of falling. Ask your doctor what other things that you can do to help prevent falls. This information is not intended to replace advice given to you by your health care provider. Make sure you discuss any questions you have with your health care provider. Document Released: 02/14/2009 Document Revised: 09/26/2015 Document Reviewed: 05/25/2014 Elsevier Interactive Patient Education  2017 [employer].

## 2021-11-26 NOTE — Progress Notes (Signed)
Subjective:   Alan Ball is a 71 y.o. male who presents for an subsequent Medicare Annual Wellness Visit.   I connected with Leigh Kaeding  today by telephone and verified that I am speaking with the correct person using two identifiers. Location patient: home Location provider: work Persons participating in the virtual visit: patient, provider.   I discussed the limitations, risks, security and privacy concerns of performing an evaluation and management service by telephone and the availability of in person appointments. I also discussed with the patient that there may be a patient responsible charge related to this service. The patient expressed understanding and verbally consented to this telephonic visit.    Interactive audio and video telecommunications were attempted between this provider and patient, however failed, due to patient having technical difficulties OR patient did not have access to video capability.  We continued and completed visit with audio only.    Review of Systems     Cardiac Risk Factors include: advanced age (>37mn, >>28women);male gender;dyslipidemia     Objective:    Today's Vitals   There is no height or weight on file to calculate BMI.     11/26/2021    9:12 AM 08/23/2020   11:23 AM 04/04/2020   11:10 AM 03/13/2019    1:39 PM 02/13/2019    1:30 PM 08/25/2018   11:12 AM 08/13/2016   10:35 AM  Advanced Directives  Does Patient Have a Medical Advance Directive? Yes Yes No;Yes Yes No No No;Yes  Type of AParamedicof [place] will Living will;Healthcare Power of ANew [place]  Does patient want to make changes to medical advance directive?  No - Patient declined  No - Patient declined   No - Patient declined  Copy of HE[place]in Chart? No - copy requested   No - copy requested     Would patient like information on creating a medical  advance directive?   No - Patient declined  Yes (Inpatient - patient requests chaplain consult to create a medical advance directive) No - Patient declined No - Patient declined    Current Medications (verified) Outpatient Encounter Medications as of 11/26/2021  Medication Sig   albuterol (VENTOLIN HFA) 108 (90 Base) MCG/ACT inhaler Inhale 1-2 puffs into the lungs every 4 (four) hours as needed for wheezing or shortness of breath.   amLODipine (NORVASC) 5 MG tablet Take 1 tablet (5 mg total) by mouth daily.   atorvastatin (LIPITOR) 10 MG tablet Take 1 tablet (10 mg total) by mouth daily.   Carbidopa-Levodopa ER (SINEMET CR) 25-100 MG tablet controlled release Take 1 tablet by mouth daily. Notes he does not take this daily   ibuprofen (ADVIL,MOTRIN) 200 MG tablet Take 200 mg by mouth every 6 (six) hours as needed for pain.   Multiple Vitamin (MULTIVITAMIN) tablet Take 1 tablet by mouth daily.   omeprazole (PRILOSEC) 20 MG capsule Take 1 capsule (20 mg total) by mouth daily.   sildenafil (VIAGRA) 50 MG tablet Take 1 tablet by mouth daily as needed.   vitamin C (ASCORBIC ACID) 500 MG tablet Take 500 mg by mouth daily.   No facility-administered encounter medications on file as of 11/26/2021.    Allergies (verified) Patient has no known allergies.   History: Past Medical History:  Diagnosis Date   Allergy    Asthma    Hyperlipemia    Hypertension  Tremor    Past Surgical History:  Procedure Laterality Date   CARPAL TUNNEL RELEASE Bilateral 2019, 2020   CATARACT EXTRACTION BILATERAL W/ ANTERIOR VITRECTOMY Bilateral    cateracts     HERNIA REPAIR Bilateral    inguinal   SHOULDER SURGERY     Family History  Problem Relation Age of Onset   Hypertension Mother    Thyroid disease Mother    Cancer Father        brain tumor (gleoplastoma)   Diabetes Father    Colon polyps Father    Colon cancer Neg Hx    Stomach cancer Neg Hx    Rectal cancer Neg Hx    Esophageal cancer Neg Hx     Pancreatic cancer Neg Hx    Social History   Socioeconomic History   Marital status: Married    Spouse name: Romie Minus   Number of children: Not on file   Years of education: Not on file   Highest education level: Bachelor's degree (e.g., BA, AB, BS)  Occupational History    Comment: retired  Tobacco Use   Smoking status: Never   Smokeless tobacco: Never  Vaping Use   Vaping Use: Never used  Substance and Sexual Activity   Alcohol use: Yes    Comment: socially   Drug use: No   Sexual activity: Never  Other Topics Concern   Not on file  Social History Narrative   Lives with wife   Caffeine- coffee 3 cups daily, maybe a soda   Social Determinants of Health   Financial Resource Strain: Low Risk  (11/26/2021)   Overall Financial Resource Strain (CARDIA)    Difficulty of Paying Living Expenses: Not hard at all  Food Insecurity: No Food Insecurity (11/26/2021)   Hunger Vital Sign    Worried About Running Out of Food in the Last Year: Never true    Ran Out of Food in the Last Year: Never true  Transportation Needs: No Transportation Needs (11/26/2021)   PRAPARE - Hydrologist (Medical): No    Lack of Transportation (Non-Medical): No  Physical Activity: Sufficiently Active (11/26/2021)   Exercise Vital Sign    Days of Exercise per Week: 5 days    Minutes of Exercise per Session: 30 min  Stress: No Stress Concern Present (11/26/2021)   [place]    Feeling of Stress : Not at all  Social Connections: [place] (11/26/2021)   Social Connection and Isolation Panel [NHANES]    Frequency of Communication with Friends and Family: Twice a week    Frequency of Social Gatherings with Friends and Family: Twice a week    Attends Religious Services: More than 4 times per year    Active Member of [employer] or Organizations: No    Attends Music therapist: 1 to 4 times per year     Marital Status: Married    Tobacco Counseling Counseling given: Not Answered   Clinical Intake:  Pre-visit preparation completed: Yes  Pain : No/denies pain     Nutritional Risks: None Diabetes: No  How often do you need to have someone help you when you read instructions, pamphlets, or other written materials from your doctor or pharmacy?: 1 - Never What is the last grade level you completed in school?: college  Diabetic?no   Interpreter Needed?: No  Information entered by :: L.Wilson,LPN   Activities of Daily Living    11/26/2021  9:14 AM  In your present state of health, do you have any difficulty performing the following activities:  Hearing? 0  Vision? 0  Difficulty concentrating or making decisions? 0  Walking or climbing stairs? 0  Dressing or bathing? 0  Doing errands, shopping? 0  Preparing Food and eating ? N  Using the Toilet? N  In the past six months, have you accidently leaked urine? N  Do you have problems with loss of bowel control? N  Managing your Medications? N  Managing your Finances? N  Housekeeping or managing your Housekeeping? N    Patient Care Team: [name], MD as PCP - General (Family Medicine) Michael Boston, MD as Consulting Physician (General Surgery) Ed Blalock, PA-C (Neurology)  Indicate any recent Medical Services you may have received from other than [facility] providers in the past year (date may be approximate).     Assessment:   This is a routine wellness examination for [place].  Hearing/Vision screen Vision Screening - Comments:: Annual eye exams   Dietary issues and exercise activities discussed: Current Exercise Habits: Home exercise routine, Type of exercise: walking, Time (Minutes): 30, Frequency (Times/Week): 5, Weekly Exercise (Minutes/Week): 150, Intensity: Mild, Exercise limited by: None identified   Goals Addressed   None    Depression Screen    11/26/2021    9:12 AM 11/26/2021    9:10 AM  06/06/2020    2:10 PM 04/04/2020   11:05 AM 09/25/2019    1:08 PM 03/27/2019    1:30 PM 02/13/2019    1:29 PM  PHQ 2/9 Scores  PHQ - 2 Score 0 0 0 0 0 0 0    Fall Risk    11/26/2021    9:12 AM 07/16/2021    9:10 AM 06/06/2020    2:10 PM 04/04/2020   11:04 AM 09/25/2019    1:08 PM  Fall Risk   Falls in the past year? 0 0 0 0 0  Number falls in past yr: 0 0  0   Injury with Fall? 0 0  0   Risk for fall due to :  No Fall Risks     Follow up Falls evaluation completed;Education provided Falls evaluation completed Falls evaluation completed Falls evaluation completed;Education provided Falls evaluation completed    [place]:  Any stairs in or around the home? Yes  If so, are there any without handrails? No  Home free of loose throw rugs in walkways, pet beds, electrical cords, etc? Yes  Adequate lighting in your home to reduce risk of falls? Yes   ASSISTIVE DEVICES UTILIZED TO PREVENT FALLS:  Life alert? No  Use of a cane, walker or w/c? No  Grab bars in the bathroom? Yes  Shower chair or bench in shower? Yes  Elevated toilet seat or a handicapped toilet? Yes    Cognitive Function:    Normal cognitive status assessed by telephone conversation  by this Nurse Health Advisor. No abnormalities found.      04/04/2020   11:03 AM 02/13/2019    1:30 PM 08/25/2018   11:15 AM  6CIT Screen  What Year? 0 points 0 points 0 points  What month? 0 points 0 points 0 points  What time? 0 points 0 points 0 points  Count back from 20 0 points 0 points 0 points  Months in reverse 0 points 2 points 0 points  Repeat phrase 0 points 2 points 0 points  Total Score 0  points 4 points 0 points    Immunizations Immunization History  Administered Date(s) Administered   Fluad Quad(high Dose 65+) 02/13/2019, 04/01/2020   Influenza, High Dose Seasonal PF 02/17/2018   Influenza,inj,Quad PF,6+ Mos 12/25/2013, 05/11/2016, 04/19/2017   Pneumococcal Conjugate-13  12/25/2013   Pneumococcal Polysaccharide-23 03/16/2014   Tdap 09/08/2012   Zoster Recombinat (Shingrix) 04/10/2019, 07/27/2019    TDAP status: Up to date  Flu Vaccine status: Up to date  Pneumococcal vaccine status: Up to date  Covid-19 vaccine status: Completed vaccines  Qualifies for Shingles Vaccine? Yes   Zostavax completed Yes   Shingrix Completed?: Yes  Screening Tests Health Maintenance  Topic Date Due   COVID-19 Vaccine (1) Never done   INFLUENZA VACCINE  12/02/2021   COLONOSCOPY (Pts 45-71yr Insurance coverage will need to be confirmed)  03/12/2022   TETANUS/TDAP  09/09/2022   Pneumonia Vaccine 71 Years old  Completed   Hepatitis C Screening  Completed   Zoster Vaccines- Shingrix  Completed   HPV VACCINES  Aged Out    Health Maintenance  Health Maintenance Due  Topic Date Due   COVID-19 Vaccine (1) Never done    Colorectal cancer screening: Type of screening: Colonoscopy. Completed 03/13/2019. Repeat every 3 years  Lung Cancer Screening: (Low Dose CT Chest recommended if Age 73-80years, 30 pack-year currently smoking OR have quit w/in 15years.) does not qualify.   Lung Cancer Screening Referral: n/a  Additional Screening:  Hepatitis C Screening: does not qualify;   Vision Screening: Recommended annual ophthalmology exams for early detection of glaucoma and other disorders of the eye. Is the patient up to date with their annual eye exam?  Yes  Who is the provider or what is the name of the office in which the patient attends annual eye exams? Dr.Palmer  If pt is not established with a provider, would they like to be referred to a provider to establish care? No .   Dental Screening: Recommended annual dental exams for proper oral hygiene  Community Resource Referral / Chronic Care Management: CRR required this visit?  No   CCM required this visit?  No      Plan:     I have personally reviewed and noted the following in the patient's chart:    Medical and social history Use of alcohol, tobacco or illicit drugs  Current medications and supplements including opioid prescriptions. Patient is not currently taking opioid prescriptions. Functional ability and status Nutritional status Physical activity Advanced directives List of other physicians Hospitalizations, surgeries, and ER visits in previous 12 months Vitals Screenings to include cognitive, depression, and falls Referrals and appointments  In addition, I have reviewed and discussed with patient certain preventive protocols, quality metrics, and best practice recommendations. A written personalized care plan for preventive services as well as general preventive health recommendations were provided to patient.     LDaphane Shepherd LPN   72/06/5425  Nurse Notes: none

## 2021-12-04 ENCOUNTER — Ambulatory Visit: Payer: Medicare HMO | Admitting: Family Medicine

## 2021-12-26 ENCOUNTER — Ambulatory Visit (INDEPENDENT_AMBULATORY_CARE_PROVIDER_SITE_OTHER): Payer: Medicare HMO | Admitting: Family Medicine

## 2021-12-26 ENCOUNTER — Encounter: Payer: Self-pay | Admitting: Family Medicine

## 2021-12-26 VITALS — BP 142/78 | HR 81 | Temp 98.2°F | Wt 178.2 lb

## 2021-12-26 DIAGNOSIS — I10 Essential (primary) hypertension: Secondary | ICD-10-CM

## 2021-12-26 DIAGNOSIS — G20 Parkinson's disease: Secondary | ICD-10-CM | POA: Diagnosis not present

## 2021-12-26 DIAGNOSIS — J3489 Other specified disorders of nose and nasal sinuses: Secondary | ICD-10-CM

## 2021-12-26 DIAGNOSIS — M7989 Other specified soft tissue disorders: Secondary | ICD-10-CM | POA: Diagnosis not present

## 2021-12-26 DIAGNOSIS — R200 Anesthesia of skin: Secondary | ICD-10-CM | POA: Diagnosis not present

## 2021-12-26 DIAGNOSIS — R251 Tremor, unspecified: Secondary | ICD-10-CM

## 2021-12-26 DIAGNOSIS — E785 Hyperlipidemia, unspecified: Secondary | ICD-10-CM

## 2021-12-26 DIAGNOSIS — K219 Gastro-esophageal reflux disease without esophagitis: Secondary | ICD-10-CM

## 2021-12-26 NOTE — Patient Instructions (Addendum)
Try calling neuro and see if other provider can see you sooner than Dr. Hall Busing to discuss other treatments for your tremor.   Ok to apply antibiotic ointment to nose lesion, but I will refer you to dermatology.   I will refer you to podiatry and new surgeon for buttock lesion.   Return to the clinic or go to the nearest emergency room if any of your symptoms worsen or new symptoms occur.

## 2021-12-26 NOTE — Progress Notes (Unsigned)
Subjective:  Patient ID: Alan Ball, male    DOB: 1950-10-07  Age: 71 y.o. MRN: [id number]  CC:  Chief Complaint  Patient presents with   Sore on nose     Pt has place on the side of the nose that does not heal has tried neosporin, vitamin E and new skin and still will not heal, has been there more than 6 moths to 1 year . It started as a black head and just never healed. Pt also wants referrals for a cyst, as well as shakes, as well as a podiatrist referral , pt is fasting     HPI Alan Ball presents for   Multiple concerns above.   Sore on left nose: Initial started about a year ago. Hard nodule, picked one day - blackhead. Some bleeding - about a year ago, not healing. Increasing in size.  No hx of skin cancer, no dermatologist.  Tx: abx ointment, vitamin E - minimal change. Forms a crater at times.   Buttock cyst: Prior issue, referred to specialist prior. Declined recommendation for surgery. Not a good fit with that surgeon. Still some soreness that comes and goes, would like to meet with different surgeon.  Tremor: With parkinsonism . 3 -4 carbidopa/levodopa per day. Followed by neurology - [facility]. Hall [facility]. Planned follow up in June, appt had to be cancelled.  Shaking worse in past month or so,  Noted after cataract surgery 2 months ago, then flu last month? No covid testing. Flu symptoms resolved but continued shaking. Appt in December, calls reviewed - option to meet with Lamonte Sakai - on wait list for Dr. Hall Busing.  Resting tremor persists.   Callous buildup and bunion? No pain, just callous buildup - would like ot meet with podiatrist.  Slight numbness in 1st -3rd toes, both sides, longstanding.    History Patient Active Problem List   Diagnosis Date Noted   Mass of soft tissue of left buttock 35/45/6256   Umbilical hernia 38/93/7342   Gastritis 07/29/2020   Parkinsonism ([place]) 10/11/2019   Cervical radiculopathy 02/21/2019   DDD (degenerative disc  disease), cervical 02/21/2019   Bilateral carpal tunnel syndrome 05/19/2018   Trigger middle finger of right hand 12/20/2017   De Quervain's tenosynovitis, right 12/20/2017   Carpal tunnel syndrome of right wrist 12/20/2017   Renal cyst, right 09/05/2015   Inguinal hernia 10/04/2012   Past Medical History:  Diagnosis Date   Allergy    Asthma    Hyperlipemia    Hypertension    Tremor    Past Surgical History:  Procedure Laterality Date   CARPAL TUNNEL RELEASE Bilateral 2019, 2020   CATARACT EXTRACTION BILATERAL W/ ANTERIOR VITRECTOMY Bilateral    cateracts     HERNIA REPAIR Bilateral    inguinal   SHOULDER SURGERY     No Known Allergies Prior to Admission medications   Medication Sig Start Date End Date Taking? Authorizing Provider  albuterol (VENTOLIN HFA) 108 (90 Base) MCG/ACT inhaler Inhale 1-2 puffs into the lungs every 4 (four) hours as needed for wheezing or shortness of breath. 07/16/21  Yes [name], MD  amLODipine (NORVASC) 5 MG tablet Take 1 tablet (5 mg total) by mouth daily. 07/16/21  Yes [name], MD  atorvastatin (LIPITOR) 10 MG tablet Take 1 tablet (10 mg total) by mouth daily. 07/16/21  Yes [name], MD  carbidopa-levodopa (SINEMET IR) 25-100 MG tablet Take by mouth. 01/31/19  Yes [provider]  Carbidopa-Levodopa ER (SINEMET CR) 25-100 MG tablet controlled release Take 1 tablet by mouth daily. Notes he does not take this daily 04/02/21  Yes [provider]  ibuprofen (ADVIL,MOTRIN) 200 MG tablet Take 200 mg by mouth every 6 (six) hours as needed for pain.   Yes [provider]  Multiple Vitamin (MULTIVITAMIN) tablet Take 1 tablet by mouth daily.   Yes [provider]  omeprazole (PRILOSEC) 20 MG capsule Take 1 capsule (20 mg total) by mouth daily. 07/16/21  Yes [name], MD  sildenafil (VIAGRA) 50 MG tablet Take 1 tablet by mouth daily as needed.   Yes [provider]  vitamin C  (ASCORBIC ACID) 500 MG tablet Take 500 mg by mouth daily.   Yes [provider]  amLODipine (NORVASC) 10 MG tablet Take by mouth.    [provider]   Social History   Socioeconomic History   Marital status: Married    Spouse name: Alan Ball   Number of children: Not on file   Years of education: Not on file   Highest education level: Bachelor's degree (e.g., BA, AB, BS)  Occupational History    Comment: retired  Tobacco Use   Smoking status: Never   Smokeless tobacco: Never  Vaping Use   Vaping Use: Never used  Substance and Sexual Activity   Alcohol use: Yes    Comment: socially   Drug use: No   Sexual activity: Never  Other Topics Concern   Not on file  Social History Narrative   Lives with wife   Caffeine- coffee 3 cups daily, maybe a soda   Social Determinants of Health   Financial Resource Strain: Low Risk  (11/26/2021)   Overall Financial Resource Strain (CARDIA)    Difficulty of Paying Living Expenses: Not hard at all  Food Insecurity: No Food Insecurity (11/26/2021)   Hunger Vital Sign    Worried About Running Out of Food in the Last Year: Never true    Ran Out of Food in the Last Year: Never true  Transportation Needs: No Transportation Needs (11/26/2021)   PRAPARE - Hydrologist (Medical): No    Lack of Transportation (Non-Medical): No  Physical Activity: Sufficiently Active (11/26/2021)   Exercise Vital Sign    Days of Exercise per Week: 5 days    Minutes of Exercise per Session: 30 min  Stress: No Stress Concern Present (11/26/2021)   [place]    Feeling of Stress : Not at all  Social Connections: Alan Ball (11/26/2021)   Social Connection and Isolation Panel [NHANES]    Frequency of Communication with Friends and Family: Twice a week    Frequency of Social Gatherings with Friends and Family: Twice a week    Attends Religious Services: More  than 4 times per year    Active Member of [employer] or Organizations: No    Attends Music therapist: 1 to 4 times per year    Marital Status: Married  Human resources officer Violence: Not At Risk (11/26/2021)   Humiliation, Afraid, Rape, and Kick questionnaire    Fear of Current or Ex-Partner: No    Emotionally Abused: No    Physically Abused: No    Sexually Abused: No    Review of Systems   Objective:   Vitals:   12/26/21 1019  BP: (!) 142/78  Pulse: 81  Temp: 98.2 F (36.8 C)  SpO2: 96%  Weight: 178 lb 3.2 oz (80.8 kg)     Physical Exam Constitutional:      General: He is not in acute distress.    Appearance: Normal appearance. He is well-developed.  HENT:     Head: Normocephalic and atraumatic.  Cardiovascular:     Rate and Rhythm: Normal rate.  Pulmonary:     Effort: Pulmonary effort is normal.  Skin:    Comments: Multiple calluses, dry skin at end of toes both feet.  Some decrease sensation at the distal first second, third toes.  No erythema.  No pain.  Slight turn at the first MTP bilaterally but no pain with range of motion.  Neurological:     Mental Status: He is alert and oriented to person, place, and time.     Comments: Resting tremor left arm.  Psychiatric:        Mood and Affect: Mood normal.         Assessment & Plan:  Alan Ball is a 71 y.o. male . Nasal lesion - Plan: Ambulatory referral to Dermatology  -Nonhealing, persistent lesion on left nasal ala.  Slight ulcerative appearing lesion, concern for basal versus squamous cell skin cancer with persistence.  Refer to dermatology.  Topical antibiotic ointment if needed temporarily with RTC precautions if acute worsening.  Parkinsonism, unspecified Parkinsonism type ([place]) Tremor  -Unfortunately based on review of neurology notes his tremor may be treatment resistant.  Recommended he discuss other options with his neurology team as it appears there were a few other meds that were listed on  previous visit to try.  He will call their office to see if other provider can see him sooner than December.  Mass of soft tissue of left buttock - Plan: Ambulatory referral to General Surgery  -See prior notes, no changes but would like a second opinion from other surgeon regarding treatment options.  Referral placed.  Numbness of toes - Plan: Ambulatory referral to Podiatry  -Reports decreased sensation, persistent in the first through third toes.  Also concerned about callus and would like to meet with podiatry.  Referral placed.  Depending on that visit, can follow-up to discuss other potential causes or treatments for peripheral neuropathy.  No orders of the defined types were placed in this encounter.  Patient Instructions  Try calling neuro and see if other provider can see you sooner than Dr. Hall Busing to discuss other treatments for your tremor.   Ok to apply antibiotic ointment to nose lesion, but I will refer you to dermatology.   I will refer you to podiatry and new surgeon for buttock lesion.   Return to the clinic or go to the nearest emergency room if any of your symptoms worsen or new symptoms occur.        Signed,   [name], MD [place] 12/26/21 10:55 AM

## 2021-12-27 ENCOUNTER — Encounter: Payer: Self-pay | Admitting: Family Medicine

## 2022-01-06 ENCOUNTER — Other Ambulatory Visit: Payer: Self-pay | Admitting: Family Medicine

## 2022-01-06 DIAGNOSIS — I10 Essential (primary) hypertension: Secondary | ICD-10-CM

## 2022-01-07 ENCOUNTER — Ambulatory Visit: Payer: Medicare HMO | Admitting: Podiatry

## 2022-01-07 DIAGNOSIS — M79675 Pain in left toe(s): Secondary | ICD-10-CM

## 2022-01-07 DIAGNOSIS — M79674 Pain in right toe(s): Secondary | ICD-10-CM | POA: Diagnosis not present

## 2022-01-07 DIAGNOSIS — B351 Tinea unguium: Secondary | ICD-10-CM | POA: Diagnosis not present

## 2022-01-07 NOTE — Progress Notes (Signed)
  Subjective:  Patient ID: Alan Ball, male    DOB: November 05, 1950,  MRN: [id number]  Chief Complaint  Patient presents with   Nail Problem    Dry skin on the top of hallux toe    71 y.o. male returns for the above complaint.  Patient presents with thickened negative dystrophic toenails x10 mild pain on palpation.  Patient would like for me to debride down he is not able to do it himself.  He denies any other acute complaints  Objective:  There were no vitals filed for this visit. Podiatric Exam: Vascular: dorsalis pedis and posterior tibial pulses are palpable bilateral. Capillary return is immediate. Temperature gradient is WNL. Skin turgor WNL  Sensorium: Normal Semmes Weinstein monofilament test. Normal tactile sensation bilaterally. Nail Exam: Pt has thick disfigured discolored nails with subungual debris noted bilateral entire nail hallux through fifth toenails.  Pain on palpation to the nails. Ulcer Exam: There is no evidence of ulcer or pre-ulcerative changes or infection. Orthopedic Exam: Muscle tone and strength are WNL. No limitations in general ROM. No crepitus or effusions noted.  Skin: No Porokeratosis. No infection or ulcers    Assessment & Plan:   1. Pain due to onychomycosis of toenails of both feet     Patient was evaluated and treated and all questions answered.  Onychomycosis with pain  -Nails palliatively debrided as below. -Educated on self-care  Procedure: Nail Debridement Rationale: pain  Type of Debridement: manual, sharp debridement. Instrumentation: Nail nipper, rotary burr. Number of Nails: 10  Procedures and Treatment: Consent by patient was obtained for treatment procedures. The patient understood the discussion of treatment and procedures well. All questions were answered thoroughly reviewed. Debridement of mycotic and hypertrophic toenails, 1 through 5 bilateral and clearing of subungual debris. No ulceration, no infection noted.  Return  Visit-Office Procedure: Patient instructed to return to the office for a follow up visit 3 months for continued evaluation and treatment.  Boneta Lucks, DPM    Return in about 3 months (around 04/08/2022) for Routine Foot Care.

## 2022-01-14 ENCOUNTER — Other Ambulatory Visit: Payer: Self-pay | Admitting: Family Medicine

## 2022-01-14 DIAGNOSIS — K219 Gastro-esophageal reflux disease without esophagitis: Secondary | ICD-10-CM

## 2022-01-16 ENCOUNTER — Encounter: Payer: Self-pay | Admitting: Family Medicine

## 2022-01-16 ENCOUNTER — Ambulatory Visit (INDEPENDENT_AMBULATORY_CARE_PROVIDER_SITE_OTHER): Payer: Medicare HMO | Admitting: Family Medicine

## 2022-01-16 VITALS — BP 138/78 | HR 75 | Temp 97.8°F | Ht 67.0 in | Wt 179.0 lb

## 2022-01-16 DIAGNOSIS — Z23 Encounter for immunization: Secondary | ICD-10-CM | POA: Diagnosis not present

## 2022-01-16 DIAGNOSIS — R739 Hyperglycemia, unspecified: Secondary | ICD-10-CM | POA: Diagnosis not present

## 2022-01-16 DIAGNOSIS — E785 Hyperlipidemia, unspecified: Secondary | ICD-10-CM | POA: Diagnosis not present

## 2022-01-16 DIAGNOSIS — I10 Essential (primary) hypertension: Secondary | ICD-10-CM | POA: Diagnosis not present

## 2022-01-16 DIAGNOSIS — Z0000 Encounter for general adult medical examination without abnormal findings: Secondary | ICD-10-CM | POA: Diagnosis not present

## 2022-01-16 DIAGNOSIS — K219 Gastro-esophageal reflux disease without esophagitis: Secondary | ICD-10-CM | POA: Diagnosis not present

## 2022-01-16 LAB — LIPID PANEL
Cholesterol: 161 mg/dL (ref 0–200)
HDL: 54.1 mg/dL (ref >39.00)
LDL Cholesterol: 91 mg/dL (ref 0–99)
NonHDL: 106.63
Total CHOL/HDL Ratio: 3
Triglycerides: 78 mg/dL (ref 0.0–149.0)
VLDL: 15.6 mg/dL (ref 0.0–40.0)

## 2022-01-16 LAB — COMPREHENSIVE METABOLIC PANEL
ALT: 5 U/L (ref 0–53)
AST: 21 U/L (ref 0–37)
Albumin: 4.2 g/dL (ref 3.5–5.2)
Alkaline Phosphatase: 62 U/L (ref 39–117)
BUN: 15 mg/dL (ref 6–23)
CO2: 30 mEq/L (ref 19–32)
Calcium: 9.7 mg/dL (ref 8.4–10.5)
Chloride: 103 mEq/L (ref 96–112)
Creatinine, Ser: 0.94 mg/dL (ref 0.40–1.50)
GFR: 80.62 mL/min (ref >60.00)
Glucose, Bld: 94 mg/dL (ref 70–99)
Potassium: 4.6 mEq/L (ref 3.5–5.1)
Sodium: 139 mEq/L (ref 135–145)
Total Bilirubin: 0.9 mg/dL (ref 0.2–1.2)
Total Protein: 7 g/dL (ref 6.0–8.3)

## 2022-01-16 LAB — HEMOGLOBIN A1C: Hgb A1c MFr Bld: 6.2 % (ref 4.6–6.5)

## 2022-01-16 MED ORDER — OMEPRAZOLE 20 MG PO CPDR: 20.0000 mg | DELAYED_RELEASE_CAPSULE | Freq: Every day | ORAL | 3 refills | Status: DC

## 2022-01-16 MED ORDER — ATORVASTATIN CALCIUM 10 MG PO TABS: 10.0000 mg | ORAL_TABLET | Freq: Every day | ORAL | 3 refills | Status: DC

## 2022-01-16 MED ORDER — AMLODIPINE BESYLATE 5 MG PO TABS: 5.0000 mg | ORAL_TABLET | Freq: Every day | ORAL | 3 refills | Status: DC

## 2022-01-16 NOTE — Patient Instructions (Addendum)
Keep follow up with specialists.  Call gastroenterology - you appear to be due for repeat colonoscopy.  Flu vaccine today.  Thanks for coming in today.    Preventive Care 22 Years and Older, Male Preventive care refers to lifestyle choices and visits with your health care provider that can promote health and wellness. Preventive care visits are also called wellness exams. What can I expect for my preventive care visit? Counseling During your preventive care visit, your health care provider may ask about your: Medical history, including: Past medical problems. Family medical history. History of falls. Current health, including: Emotional well-being. Home life and relationship well-being. Sexual activity. Memory and ability to understand (cognition). Lifestyle, including: Alcohol, nicotine or tobacco, and drug use. Access to firearms. Diet, exercise, and sleep habits. Work and work Statistician. Sunscreen use. Safety issues such as seatbelt and bike helmet use. Physical exam Your health care provider will check your: Height and weight. These may be used to calculate your BMI (body mass index). BMI is a measurement that tells if you are at a healthy weight. Waist circumference. This measures the distance around your waistline. This measurement also tells if you are at a healthy weight and may help predict your risk of certain diseases, such as type 2 diabetes and high blood pressure. Heart rate and blood pressure. Body temperature. Skin for abnormal spots. What immunizations do I need?  Vaccines are usually given at various ages, according to a schedule. Your health care provider will recommend vaccines for you based on your age, medical history, and lifestyle or other factors, such as travel or where you work. What tests do I need? Screening Your health care provider may recommend screening tests for certain conditions. This may include: Lipid and cholesterol levels. Diabetes  screening. This is done by checking your blood sugar (glucose) after you have not eaten for a while (fasting). Hepatitis C test. Hepatitis B test. HIV (human immunodeficiency virus) test. STI (sexually transmitted infection) testing, if you are at risk. Lung cancer screening. Colorectal cancer screening. Prostate cancer screening. Abdominal aortic aneurysm (AAA) screening. You may need this if you are a current or former smoker. Talk with your health care provider about your test results, treatment options, and if necessary, the need for more tests. Follow these instructions at home: Eating and drinking  Eat a diet that includes fresh fruits and vegetables, whole grains, lean protein, and low-fat dairy products. Limit your intake of foods with high amounts of sugar, saturated fats, and salt. Take vitamin and mineral supplements as recommended by your health care provider. Do not drink alcohol if your health care provider tells you not to drink. If you drink alcohol: Limit how much you have to 0-2 drinks a day. Know how much alcohol is in your drink. In the [place] one drink equals one 12 oz bottle of beer (355 mL), one 5 oz glass of wine (148 mL), or one 1 oz glass of hard liquor (44 mL). Lifestyle Brush your teeth every morning and night with fluoride toothpaste. Floss one time each day. Exercise for at least 30 minutes 5 or more days each week. Do not use any products that contain nicotine or tobacco. These products include cigarettes, chewing tobacco, and vaping devices, such as e-cigarettes. If you need help quitting, ask your health care provider. Do not use drugs. If you are sexually active, practice safe sex. Use a condom or other form of protection to prevent STIs. Take aspirin only as told by your  health care provider. Make sure that you understand how much to take and what form to take. Work with your health care provider to find out whether it is safe and beneficial for you to take  aspirin daily. Ask your health care provider if you need to take a cholesterol-lowering medicine (statin). Find healthy ways to manage stress, such as: Meditation, yoga, or listening to music. Journaling. Talking to a trusted person. Spending time with friends and family. Safety Always wear your seat belt while driving or riding in a vehicle. Do not drive: If you have been drinking alcohol. Do not ride with someone who has been drinking. When you are tired or distracted. While texting. If you have been using any mind-altering substances or drugs. Wear a helmet and other protective equipment during sports activities. If you have firearms in your house, make sure you follow all gun safety procedures. Minimize exposure to UV radiation to reduce your risk of skin cancer. What's next? Visit your health care provider once a year for an annual wellness visit. Ask your health care provider how often you should have your eyes and teeth checked. Stay up to date on all vaccines. This information is not intended to replace advice given to you by your health care provider. Make sure you discuss any questions you have with your health care provider. Document Revised: 10/16/2020 Document Reviewed: 10/16/2020 Elsevier Patient Education  [place].

## 2022-01-16 NOTE — Progress Notes (Signed)
Subjective:  Patient ID: Alan Ball, male    DOB: [birth year]  Age: 71 y.o. MRN: [id number]  CC:  Chief Complaint  Patient presents with   Annual Exam    Pt states all is well Pt is fasting   HPI Alan Ball presents for Annual Exam We recently seen August 25 and referred to podiatry, dermatology, general surgery. Has seen podiatry, upcoming appts with general surgery and dermatology.   Care team PCP, me Podiatry, Dr. Pose[facility], Dr. Noemi [facility], Dr. Silverio [facility], Dr. Hall Busing [facility].  Parkinson's.  See recent visit, we discussed medication plan and neurology follow-up at his August 25. Appt on 9/27 with Dr. Hall Busing  Normal lipid panel in March.  CMP normal at that time except glucose 104. On Lipitor for hyperlipidemia, amlodipine for hypertension, omeprazole for history of GERD.  Albuterol as needed for mild intermittent asthma, last discussed in March.  Rare use in the winter.not needed recently.  Fasting today - not last visit.      01/16/2022   11:03 AM 12/26/2021   10:17 AM 11/26/2021    9:12 AM 11/26/2021    9:10 AM 06/06/2020    2:10 PM  Depression screen PHQ 2/9  Decreased Interest 0 0 0 0 0  Down, Depressed, Hopeless 0 0 0 0 0  PHQ - 2 Score 0 0 0 0 0  Altered sleeping 0 0     Tired, decreased energy 0 1     Change in appetite 0 0     Feeling bad or failure about yourself  0 0     Trouble concentrating 0 0     Moving slowly or fidgety/restless 0 1     Suicidal thoughts 0 0     PHQ-9 Score 0 2       Health Maintenance  Topic Date Due   INFLUENZA VACCINE  12/02/2021   COVID-19 Vaccine (1) 02/01/2022 (Originally 11/23/1955)   COLONOSCOPY (Pts 45-93yr Insurance coverage will need to be confirmed)  03/12/2022   TETANUS/TDAP  09/09/2022   Pneumonia Vaccine 71 Years old  Completed   Hepatitis C Screening  Completed   Zoster Vaccines- Shingrix  Completed   HPV VACCINES  Aged Out  Colonoscopy 03/13/19. Multiple polyps,  repeat 3 yrs - advised to call for appt.  Prostate: followed by urology, appt few motnhs ago. Told was ok, 1 year follow up.  Lab Results  Component Value Date   PSA1 4.2 (H) 08/13/2016  Derm appt pending next month.  Immunization History  Administered Date(s) Administered   Fluad Quad(high Dose 65+) 02/13/2019, 04/01/2020   Influenza, High Dose Seasonal PF 02/17/2018   Influenza,inj,Quad PF,6+ Mos 12/25/2013, 05/11/2016, 04/19/2017   Pneumococcal Conjugate-13 12/25/2013   Pneumococcal Polysaccharide-23 03/16/2014   Tdap 09/08/2012   Zoster Recombinat (Shingrix) 04/10/2019, 07/27/2019  Flu vaccine today.  Declines covid vaccines.   No results found. Regular care, cataract surgery this summer.   Dental:every 6 months.   Alcohol:none typically  Tobacco: none  Exercise: some work at home, walking for exercise. Range of motion activities. 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    History Patient Active Problem List   Diagnosis Date Noted   Mass of soft tissue of left buttock 0376/54/6503 Umbilical hernia 0354/65/6812 Gastritis 07/29/2020   [place]   Cervical radiculopathy 02/21/2019   DDD (degenerative disc disease), cervical 02/21/2019   Bilateral carpal tunnel syndrome 05/19/2018   Trigger middle finger  of right hand 12/20/2017   De Quervain's tenosynovitis, right 12/20/2017   Carpal tunnel syndrome of right wrist 12/20/2017   Renal cyst, right 09/05/2015   Inguinal hernia 10/04/2012   Past Medical History:  Diagnosis Date   Allergy    Asthma    Hyperlipemia    Hypertension    Tremor    Past Surgical History:  Procedure Laterality Date   CARPAL TUNNEL RELEASE Bilateral 2019, 2020   CATARACT EXTRACTION BILATERAL W/ ANTERIOR VITRECTOMY Bilateral    cateracts     HERNIA REPAIR Bilateral    inguinal   SHOULDER SURGERY     No Known Allergies Prior to Admission medications   Medication Sig Start Date End Date Taking? Authorizing Provider   albuterol (VENTOLIN HFA) 108 (90 Base) MCG/ACT inhaler Inhale 1-2 puffs into the lungs every 4 (four) hours as needed for wheezing or shortness of breath. 07/16/21  Yes [name], MD  amLODipine (NORVASC) 10 MG tablet Take by mouth.   Yes [provider]  amLODipine (NORVASC) 5 MG tablet TAKE ONE TABLET BY MOUTH ONE TIME DAILY 01/06/22  Yes [name], MD  atorvastatin (LIPITOR) 10 MG tablet Take 1 tablet (10 mg total) by mouth daily. 07/16/21  Yes [name], MD  carbidopa-levodopa (SINEMET IR) 25-100 MG tablet Take by mouth. 01/31/19  Yes [provider]  Carbidopa-Levodopa ER (SINEMET CR) 25-100 MG tablet controlled release Take 1 tablet by mouth daily. Notes he does not take this daily 04/02/21  Yes [provider]  ibuprofen (ADVIL,MOTRIN) 200 MG tablet Take 200 mg by mouth every 6 (six) hours as needed for pain.   Yes [provider]  Multiple Vitamin (MULTIVITAMIN) tablet Take 1 tablet by mouth daily.   Yes [provider]  omeprazole (PRILOSEC) 20 MG capsule TAKE ONE CAPSULE BY MOUTH ONE TIME DAILY 01/14/22  Yes [name], MD  sildenafil (VIAGRA) 50 MG tablet Take 1 tablet by mouth daily as needed.   Yes [provider]  vitamin C (ASCORBIC ACID) 500 MG tablet Take 500 mg by mouth daily.   Yes [provider]   Social History   Socioeconomic History   Marital status: Married    Spouse name: Romie Minus   Number of children: Not on file   Years of education: Not on file   Highest education level: Bachelor's degree (e.g., BA, AB, BS)  Occupational History    Comment: retired  Tobacco Use   Smoking status: Never   Smokeless tobacco: Never  Vaping Use   Vaping Use: Never used  Substance and Sexual Activity   Alcohol use: Yes    Comment: socially   Drug use: No   Sexual activity: Never  Other Topics Concern   Not on file  Social History Narrative   Lives with wife   Caffeine- coffee 3 cups daily,  maybe a soda   Social Determinants of Health   Financial Resource Strain: Low Risk  (11/26/2021)   Overall Financial Resource Strain (CARDIA)    Difficulty of Paying Living Expenses: Not hard at all  Food Insecurity: No Food Insecurity (11/26/2021)   Hunger Vital Sign    Worried About Running Out of Food in the Last Year: Never true    Ran Out of Food in the Last Year: Never true  Transportation Needs: No Transportation Needs (11/26/2021)   PRAPARE - Hydrologist (Medical): No    Lack of Transportation (Non-Medical): No  Physical Activity: Sufficiently Active (11/26/2021)   Exercise Vital Sign    Days of Exercise per Week: 5 days    Minutes of Exercise per Session: 30 min  Stress: No Stress Concern Present (11/26/2021)   [place]    Feeling of Stress : Not at all  Social Connections: [place] (11/26/2021)   Social Connection and Isolation Panel [NHANES]    Frequency of Communication with Friends and Family: Twice a week    Frequency of Social Gatherings with Friends and Family: Twice a week    Attends Religious Services: More than 4 times per year    Active Member of [employer] or Organizations: No    Attends Archivist Meetings: 1 to 4 times per year    Marital Status: Married  Human resources officer Violence: Not At Risk (11/26/2021)   Humiliation, Afraid, Rape, and Kick questionnaire    Fear of Current or Ex-Partner: No    Emotionally Abused: No    Physically Abused: No    Sexually Abused: No    Review of Systems  13 point review of systems per patient health survey noted.  Negative other than as indicated above or in HPI.   Objective:   Vitals:   01/16/22 1109  BP: 138/78  Pulse: 75  Temp: 97.8 F (36.6 C)  SpO2: 96%  Weight: 179 lb (81.2 kg)  Height: '5\' 7"'$  (1.702 m)     Physical Exam Vitals reviewed.  Constitutional:      Appearance: He is well-developed.   HENT:     Head: Normocephalic and atraumatic.     Right Ear: External ear normal.     Left Ear: External ear normal.  Eyes:     Conjunctiva/sclera: Conjunctivae normal.     Pupils: Pupils are equal, round, and reactive to light.  Neck:     Thyroid: No thyromegaly.  Cardiovascular:     Rate and Rhythm: Normal rate and regular rhythm.     Heart sounds: Normal heart sounds.  Pulmonary:     Effort: Pulmonary effort is normal. No respiratory distress.     Breath sounds: Normal breath sounds. No wheezing.  Abdominal:     General: There is no distension.     Palpations: Abdomen is soft.     Tenderness: There is no abdominal tenderness.  Musculoskeletal:        General: No tenderness. Normal range of motion.     Cervical back: Normal range of motion and neck supple.  Lymphadenopathy:     Cervical: No cervical adenopathy.  Skin:    General: Skin is warm and dry.  Neurological:     Mental Status: He is alert and oriented to person, place, and time.     Deep Tendon Reflexes: Reflexes are normal and symmetric.  Psychiatric:        Behavior: Behavior normal.      Assessment & Plan:  BEECHER FURIO is a 70 y.o. male . Annual physical exam - Plan: Comprehensive metabolic panel, Lipid panel  Need for influenza vaccination - Plan: Flu Vaccine QUAD High Dose(Fluad)  Essential hypertension - Plan: amLODipine (NORVASC) 5 MG tablet  Hyperlipidemia, unspecified hyperlipidemia type - Plan: Lipid panel, atorvastatin (LIPITOR) 10 MG tablet  Hyperglycemia - Plan: Hemoglobin A1c, Comprehensive metabolic panel  Gastroesophageal reflux disease, unspecified whether esophagitis present - Plan: omeprazole (PRILOSEC) 20 MG capsule   -anticipatory guidance as below in AVS, screening labs above. Health maintenance items as above  in HPI discussed/recommended as applicable.  Mild hyperglycemia as not fasting likely on last labs, check A1c. Htn, hld, gerd stable.  Keep follow up with specialists as  planned.   Meds ordered this encounter  Medications   amLODipine (NORVASC) 5 MG tablet    Sig: Take 1 tablet (5 mg total) by mouth daily.    Dispense:  90 tablet    Refill:  3   omeprazole (PRILOSEC) 20 MG capsule    Sig: Take 1 capsule (20 mg total) by mouth daily.    Dispense:  90 capsule    Refill:  3   atorvastatin (LIPITOR) 10 MG tablet    Sig: Take 1 tablet (10 mg total) by mouth daily.    Dispense:  90 tablet    Refill:  3   Patient Instructions  Keep follow up with specialists.  Call gastroenterology - you appear to be due for repeat colonoscopy.  Flu vaccine today.  Thanks for coming in today.    Preventive Care 7 Years and Older, Male Preventive care refers to lifestyle choices and visits with your health care provider that can promote health and wellness. Preventive care visits are also called wellness exams. What can I expect for my preventive care visit? Counseling During your preventive care visit, your health care provider may ask about your: Medical history, including: Past medical problems. Family medical history. History of falls. Current health, including: Emotional well-being. Home life and relationship well-being. Sexual activity. Memory and ability to understand (cognition). Lifestyle, including: Alcohol, nicotine or tobacco, and drug use. Access to firearms. Diet, exercise, and sleep habits. Work and work Statistician. Sunscreen use. Safety issues such as seatbelt and bike helmet use. Physical exam Your health care provider will check your: Height and weight. These may be used to calculate your BMI (body mass index). BMI is a measurement that tells if you are at a healthy weight. Waist circumference. This measures the distance around your waistline. This measurement also tells if you are at a healthy weight and may help predict your risk of certain diseases, such as type 2 diabetes and high blood pressure. Heart rate and blood pressure. Body  temperature. Skin for abnormal spots. What immunizations do I need?  Vaccines are usually given at various ages, according to a schedule. Your health care provider will recommend vaccines for you based on your age, medical history, and lifestyle or other factors, such as travel or where you work. What tests do I need? Screening Your health care provider may recommend screening tests for certain conditions. This may include: Lipid and cholesterol levels. Diabetes screening. This is done by checking your blood sugar (glucose) after you have not eaten for a while (fasting). Hepatitis C test. Hepatitis B test. HIV (human immunodeficiency virus) test. STI (sexually transmitted infection) testing, if you are at risk. Lung cancer screening. Colorectal cancer screening. Prostate cancer screening. Abdominal aortic aneurysm (AAA) screening. You may need this if you are a current or former smoker. Talk with your health care provider about your test results, treatment options, and if necessary, the need for more tests. Follow these instructions at home: Eating and drinking  Eat a diet that includes fresh fruits and vegetables, whole grains, lean protein, and low-fat dairy products. Limit your intake of foods with high amounts of sugar, saturated fats, and salt. Take vitamin and mineral supplements as recommended by your health care provider. Do not drink alcohol if your health care provider tells you not to drink. If you  drink alcohol: Limit how much you have to 0-2 drinks a day. Know how much alcohol is in your drink. In the [place] one drink equals one 12 oz bottle of beer (355 mL), one 5 oz glass of wine (148 mL), or one 1 oz glass of hard liquor (44 mL). Lifestyle Brush your teeth every morning and night with fluoride toothpaste. Floss one time each day. Exercise for at least 30 minutes 5 or more days each week. Do not use any products that contain nicotine or tobacco. These products include  cigarettes, chewing tobacco, and vaping devices, such as e-cigarettes. If you need help quitting, ask your health care provider. Do not use drugs. If you are sexually active, practice safe sex. Use a condom or other form of protection to prevent STIs. Take aspirin only as told by your health care provider. Make sure that you understand how much to take and what form to take. Work with your health care provider to find out whether it is safe and beneficial for you to take aspirin daily. Ask your health care provider if you need to take a cholesterol-lowering medicine (statin). Find healthy ways to manage stress, such as: Meditation, yoga, or listening to music. Journaling. Talking to a trusted person. Spending time with friends and family. Safety Always wear your seat belt while driving or riding in a vehicle. Do not drive: If you have been drinking alcohol. Do not ride with someone who has been drinking. When you are tired or distracted. While texting. If you have been using any mind-altering substances or drugs. Wear a helmet and other protective equipment during sports activities. If you have firearms in your house, make sure you follow all gun safety procedures. Minimize exposure to UV radiation to reduce your risk of skin cancer. What's next? Visit your health care provider once a year for an annual wellness visit. Ask your health care provider how often you should have your eyes and teeth checked. Stay up to date on all vaccines. This information is not intended to replace advice given to you by your health care provider. Make sure you discuss any questions you have with your health care provider. Document Revised: 10/16/2020 Document Reviewed: 10/16/2020 Elsevier Patient Education  [place],   [name], MD [place], [facility] 01/16/22 12:14 PM

## 2022-01-19 ENCOUNTER — Encounter: Payer: Self-pay | Admitting: Surgery

## 2022-01-19 ENCOUNTER — Ambulatory Visit: Payer: Medicare HMO | Admitting: Surgery

## 2022-01-19 VITALS — BP 154/81 | HR 82 | Temp 97.7°F | Ht 68.0 in | Wt 179.2 lb

## 2022-01-19 DIAGNOSIS — L729 Follicular cyst of the skin and subcutaneous tissue, unspecified: Secondary | ICD-10-CM

## 2022-01-19 NOTE — Patient Instructions (Signed)
If you have any concerns or questions, please feel free to call our office. See follow up appointment below.   Excision of Skin Lesions Excision of a skin lesion is the removal of a section of skin by making small incisions in the skin. Through this process, the lesion is completely removed. This procedure is often done to treat or prevent cancer or infection. It may also be done to improve cosmetic appearance. You may have this procedure to remove: Cancerous (malignant) growths, such as basal cell carcinoma, squamous cell carcinoma, or melanoma. Noncancerous (benign) growths, such as a cyst or lipoma. Growths, such as moles or skin tags, which may be removed for cosmetic reasons. Various excision or surgical techniques may be used depending on your condition, the location of the lesion, and your overall health. Tell your health care provider about: Any allergies you have. All medicines you are taking, including vitamins, herbs, eye drops, creams, and over-the-counter medicines. Any problems you or family members have had with anesthetic medicines. Any bleeding problems you have. Any surgeries you have had. Any medical conditions you have. Whether you are pregnant or may be pregnant. What are the risks? Generally, this is a safe procedure. However, problems may occur, including: Bleeding. Infection. Scarring. Recurrence of the cyst, lipoma, or cancer. Allergic reaction to anesthetics, surgical materials, or ointments. Damage to nerves, blood vessels, muscles, or other structures. What happens before the procedure? Medicines Ask your health care provider about: Changing or stopping your regular medicines. This is especially important if you are taking diabetes medicines or blood thinners. Taking medicines such as aspirin and ibuprofen. These medicines can thin your blood. Do not take these medicines unless your health care provider tells you to take them. Taking over-the-counter  medicines, vitamins, herbs, and supplements. General instructions Do not use any products that contain nicotine or tobacco. These products include cigarettes, chewing tobacco, and vaping devices, such as e-cigarettes. If you need help quitting, ask your health care provider. Follow instructions from your health care provider about eating or drinking restrictions. Ask your health care provider: How your surgery site will be marked. What steps will be taken to help prevent infection. These steps may include: Removing hair at the surgery site. Washing skin with a germ-killing soap. Taking antibiotic medicine. Ask your health care provider if you will need someone to take you home from the hospital or clinic after the procedure. What happens during the procedure?  You will be given a medicine to numb the area (local anesthetic). Your health care provider will remove the lesions using one of the following excision techniques. Complete surgical excision. This procedure may be done to treat a cancerous growth or a noncancerous cyst or lesion. A small scalpel or scissors will be used to gently cut around and under the lesion until it is completely removed. If bleeding occurs, it will be stopped with a device that delivers heat (electrocautery). The edges of the wound may be stitched (sutured) together. A bandage (dressing) will be applied. Samples will be sent to a lab for testing. Excision of a cyst. An incision will be made on the cyst. The entire cyst will be removed through the incision. The incision may be closed with sutures. Shave excision. This may be done to remove a mole or other small growths. A small blade or scalpel will be used to shave off the lesion. The wound is usually left to heal on its own without sutures. The sample may be sent to a lab  for testing. Punch excision. This may be done to completely remove a mole or other small growths. A small tool that is like a cookie  cutter or a hole punch is used to cut a circle shape out of the skin. The outer edges of the skin will be sutured together. The sample may be sent to a lab for testing. Mohs micrographic surgery. This is usually done to treat skin cancer. This type of excision is mostly used on the face and ears. This procedure is minimally invasive, and it ensures the best cosmetic outcome. A scalpel or a loop instrument will be used to remove layers of the lesion until all the abnormal or cancerous tissue has been removed. The wound may be sutured, depending on its size. The tissue will be checked under a microscope right away. The procedure may vary among health care providers and hospitals. At the end of any of these procedures, antibiotic ointment will be applied as needed. What happens after the procedure? Talk with your health care provider to discuss any test results, treatment options, and if necessary, the need for more tests. Keep all follow-up visits. This is important. Summary Excision of a skin lesion is the removal of a section of skin by making small incisions in the skin. This procedure is often done to treat or prevent skin cancer, remove benign growths, or it may be done to improve cosmetic appearance. Various excision or surgical techniques may be used depending on your condition, the location of the lesion, and your overall health. After the procedure, talk with your health care provider to discuss any test results, treatment options, and if necessary, the need for more tests. Keep all follow-up visits. This is important. This information is not intended to replace advice given to you by your health care provider. Make sure you discuss any questions you have with your health care provider. Document Revised: 11/19/2020 Document Reviewed: 11/19/2020 Elsevier Patient Education  [place].

## 2022-01-19 NOTE — Progress Notes (Signed)
01/19/2022  Reason for Visit: Left buttocks mass  Requesting Provider: [name], MD  History of Present Illness: Alan Ball is a 71 y.o. male seen for evaluation for a left buttocks mass.  The patient reports that he has had this for many years and over the course of time, has had intermittent episodes where the area gets more flared up or mildly tender and inflamed but denies any episodes of any drainage.  He has noted over time that the skin has become more discolored over the mass itself and is also continue to become more uncomfortable depending on the way he sits or if he has his wallet on the left back pocket.  Denies any episodes of drainage.  Reports that he feels the mass is about the same size.  Denies any fevers, chills, chest pain, shortness of breath.  Past Medical History: Past Medical History:  Diagnosis Date   Allergy    Asthma    Hyperlipemia    Hypertension    Tremor      Past Surgical History: Past Surgical History:  Procedure Laterality Date   CARPAL TUNNEL RELEASE Bilateral 2019, 2020   CATARACT EXTRACTION BILATERAL W/ ANTERIOR VITRECTOMY Bilateral    cateracts     HERNIA REPAIR Bilateral    inguinal   SHOULDER SURGERY      Home Medications: Prior to Admission medications   Medication Sig Start Date End Date Taking? Authorizing Provider  albuterol (VENTOLIN HFA) 108 (90 Base) MCG/ACT inhaler Inhale 1-2 puffs into the lungs every 4 (four) hours as needed for wheezing or shortness of breath. 07/16/21  Yes [name], MD  amLODipine (NORVASC) 5 MG tablet Take 1 tablet (5 mg total) by mouth daily. 01/16/22  Yes [name], MD  atorvastatin (LIPITOR) 10 MG tablet Take 1 tablet (10 mg total) by mouth daily. 01/16/22  Yes [name], MD  carbidopa-levodopa (SINEMET IR) 25-100 MG tablet Take by mouth. 01/31/19  Yes [provider]  Carbidopa-Levodopa ER (SINEMET CR) 25-100 MG tablet controlled release Take 1 tablet by mouth daily.  Notes he does not take this daily 04/02/21  Yes [provider]  ibuprofen (ADVIL,MOTRIN) 200 MG tablet Take 200 mg by mouth every 6 (six) hours as needed for pain.   Yes [provider]  Multiple Vitamin (MULTIVITAMIN) tablet Take 1 tablet by mouth daily.   Yes [provider]  omeprazole (PRILOSEC) 20 MG capsule Take 1 capsule (20 mg total) by mouth daily. 01/16/22  Yes [name], MD  sildenafil (VIAGRA) 50 MG tablet Take 1 tablet by mouth daily as needed.   Yes [provider]  vitamin C (ASCORBIC ACID) 500 MG tablet Take 500 mg by mouth daily.   Yes [provider]    Allergies: No Known Allergies  Social History:  reports that he has never smoked. He has never used smokeless tobacco. He reports current alcohol use. He reports that he does not use drugs.   Family History: Family History  Problem Relation Age of Onset   Hypertension Mother    Thyroid disease Mother    Cancer Father        brain tumor (gleoplastoma)   Diabetes Father    Colon polyps Father    Colon cancer Neg Hx    Stomach cancer Neg Hx    Rectal cancer Neg Hx    Esophageal cancer Neg Hx    Pancreatic cancer Neg Hx     Review of  Systems: Review of Systems  Constitutional:  Negative for chills and fever.  Respiratory:  Negative for shortness of breath.   Cardiovascular:  Negative for chest pain.  Gastrointestinal:  Negative for abdominal pain, nausea and vomiting.  Skin:  Negative for rash.    Physical Exam BP (!) 154/81   Pulse 82   Temp 97.7 F (36.5 C) (Oral)   Ht '5\' 8"'$  (1.727 m)   Wt 179 lb 3.2 oz (81.3 kg)   SpO2 96%   BMI 27.25 kg/m  CONSTITUTIONAL: No acute distress, well-nourished HEENT:  Normocephalic, atraumatic, extraocular motion intact. RESPIRATORY:  Normal respiratory effort without pathologic use of accessory muscles. CARDIOVASCULAR: Regular rhythm and rate. MUSCULOSKELETAL: Patient has a tremor of his left upper extremity. SKIN:  There is an approximately 2 cm mass over the medial mid level left buttocks which is soft, with some skin discoloration, currently not particularly tender, with no active drainage, consistent with what looks like a complex sebaceous cyst. NEUROLOGIC:  Motor and sensation is grossly normal.  Cranial nerves are grossly intact. PSYCH:  Alert and oriented to person, place and time. Affect is normal.  Laboratory Analysis: No results found for this or any previous visit (from the past 24 hour(s)).  Imaging: No results found.  Assessment and Plan: This is a 71 y.o. male with a left buttocks complex invasive cyst.  - Discussed with the patient that this likely is a sebaceous cyst although possibly could be a lipoma.  This appears to be more superficial to be a lipoma.  Discussed with the patient at this something that we could excise in the office as a procedure under local anesthesia without any sedation.  The patient is amenable for this option and he is willing to schedule an office procedure.  Reviewed with him the procedure at length including the risks of bleeding, infection, injury to surrounding structures, sutures to be used, activity restrictions, he is willing to proceed. - We will schedule him for an office procedure here on 02/04/22  I spent 30 minutes dedicated to the care of this patient on the date of this encounter to include pre-visit review of records, face-to-face time with the patient discussing diagnosis and management, and any post-visit coordination of care.   Melvyn Neth, [facility]

## 2022-01-27 DIAGNOSIS — Z79899 Other long term (current) drug therapy: Secondary | ICD-10-CM | POA: Diagnosis not present

## 2022-01-27 DIAGNOSIS — R200 Anesthesia of skin: Secondary | ICD-10-CM | POA: Diagnosis not present

## 2022-01-27 DIAGNOSIS — M25519 Pain in unspecified shoulder: Secondary | ICD-10-CM | POA: Diagnosis not present

## 2022-01-27 DIAGNOSIS — G20 Parkinson's disease: Secondary | ICD-10-CM | POA: Diagnosis not present

## 2022-01-27 DIAGNOSIS — M542 Cervicalgia: Secondary | ICD-10-CM | POA: Diagnosis not present

## 2022-02-01 DIAGNOSIS — C439 Malignant melanoma of skin, unspecified: Secondary | ICD-10-CM

## 2022-02-01 HISTORY — DX: Malignant melanoma of skin, unspecified: C43.9

## 2022-02-04 ENCOUNTER — Ambulatory Visit: Payer: Medicare HMO | Admitting: Surgery

## 2022-02-04 ENCOUNTER — Encounter: Payer: Self-pay | Admitting: Surgery

## 2022-02-04 ENCOUNTER — Other Ambulatory Visit: Payer: Self-pay | Admitting: Surgery

## 2022-02-04 VITALS — BP 153/82 | HR 101 | Temp 98.3°F | Ht 68.0 in | Wt 175.0 lb

## 2022-02-04 DIAGNOSIS — L7211 Pilar cyst: Secondary | ICD-10-CM | POA: Diagnosis not present

## 2022-02-04 DIAGNOSIS — L729 Follicular cyst of the skin and subcutaneous tissue, unspecified: Secondary | ICD-10-CM | POA: Diagnosis not present

## 2022-02-04 NOTE — Progress Notes (Signed)
  Procedure Date:  02/04/2022  Pre-operative Diagnosis:  Left buttocks cyst  Post-operative Diagnosis:  Left buttocks cyst, 1.5 cm  Procedure:  Excision of Left buttocks cyst; layered closure of left buttocks wound 2 cm.  Surgeon:  Melvyn Neth, MD  Anesthesia:  General endotracheal  Estimated Blood Loss:  3 ml  Specimens:  left buttocks cyst  Complications:  None  Indications for Procedure:  This is a 71 y.o. male with diagnosis of a symptomatic left buttocks cyst.  The patient wishes to have this excised. The risks of bleeding, abscess or infection, injury to surrounding structures, and need for further procedures were all discussed with the patient and he was willing to proceed.  Description of Procedure: The patient was correctly identified at bedside.  The patient was placed supine.  Appropriate time-outs were performed.  The patient's left buttocks was prepped and draped in usual sterile fashion.  Local anesthetic was infused intradermally.  An elliptical 2 cm incision was made over the cyst, and scalpel was used to dissect down the skin and subcutaneous tissue.  Skin flaps were created sharply, and then the cyst was excised.  It was sent off to pathology.  The cavity was then irrigated.  Hemostasis was good.  The wound was then closed in two layers using 3-0 Vicryl and 4-0 Monocryl.  The incision was cleaned and sealed with DermaBond.  The patient tolerated the procedure well and all sharps were appropriately disposed of at the end of the case.  --Patient may shower tomorrow. --May take tylenol or ibuprofen for pain control. --Follow up in 10 days for wound check.  Melvyn Neth, MD

## 2022-02-04 NOTE — Patient Instructions (Signed)
We have removed a Cyst in our office today.  You have sutures under the skin that will dissolve and also dermabond (skin glue) on top of your skin which will come off on it's own in 10-14 days.  You may use Ibuprofen or Tylenol as needed for pain control. Use the ice pack 3-4 times a day for the next two days for any achiness.  You may shower tomorrow, do not scrub at the area.  Avoid Strenuous activities that will make you sweat during the next 48 hours to avoid the glue coming off prematurely. Avoid activities that will place pressure to this area of the body for 1-2 weeks to avoid re-injury to incision site.  Please see your follow-up appointment provided. We will see you back in office to make sure this area is healed and to review the final pathology. If you have any questions or concerns prior to this appointment, call our office and speak with a nurse.    Excision of Skin Cysts or Lesions Excision of a skin lesion refers to the removal of a section of skin by making small cuts (incisions) in the skin. This procedure may be done to remove a cancerous (malignant) or noncancerous (benign) growth on the skin. It is typically done to treat or prevent cancer or infection. It may also be done to improve cosmetic appearance. The procedure may be done to remove: Cancerous growths, such as basal cell carcinoma, squamous cell carcinoma, or melanoma. Noncancerous growths, such as a cyst or lipoma. Growths, such as moles or skin tags, which may be removed for cosmetic reasons.  Various excision or surgical techniques may be used depending on your condition, the location of the lesion, and your overall health. Tell a health care provider about: Any allergies you have. All medicines you are taking, including vitamins, herbs, eye drops, creams, and over-the-counter medicines. Any problems you or family members have had with anesthetic medicines. Any blood disorders you have. Any surgeries you have  had. Any medical conditions you have. Whether you are pregnant or may be pregnant. What are the risks? Generally, this is a safe procedure. However, problems may occur, including: Bleeding. Infection. Scarring. Recurrence of the cyst, lipoma, or cancer. Changes in skin sensation or appearance, such as discoloration or swelling. Reaction to the anesthetics. Allergic reaction to surgical materials or ointments. Damage to nerves, blood vessels, muscles, or other structures. Continued pain.  What happens before the procedure? Ask your health care provider about: Changing or stopping your regular medicines. This is especially important if you are taking diabetes medicines or blood thinners. Taking medicines such as aspirin and ibuprofen. These medicines can thin your blood. Do not take these medicines before your procedure if your health care provider instructs you not to. You may be asked to take certain medicines. You may be asked to stop smoking. You may have an exam or testing. What happens during the procedure? To reduce your risk of infection: Your health care team will wash or sanitize their hands. Your skin will be washed with soap. You will be given a medicine to numb the area (local anesthetic). One of the following excision techniques will be performed. At the end of any of these procedures, antibiotic ointment will be applied as needed. Each of the following techniques may vary among health care providers and hospitals. Complete Surgical Excision The area of skin that needs to be removed will be marked with a pen. Using a small scalpel or scissors, the surgeon   will gently cut around and under the lesion until it is completely removed. The lesion will be placed in a fluid and sent to the lab for examination. If necessary, bleeding will be controlled with a device that delivers heat (electrocautery). The edges of the wound may be stitched (sutured) together, and a bandage  (dressing) will be applied. This procedure may be performed to treat a cancerous growth or a noncancerous cyst or lesion. Excision of a Cyst The surgeon will make an incision on the cyst. The entire cyst will be removed through the incision. The incision may be closed with sutures.  What happens after the procedure? Return to your normal activities as told by your health care provider.  

## 2022-02-12 ENCOUNTER — Ambulatory Visit (INDEPENDENT_AMBULATORY_CARE_PROVIDER_SITE_OTHER): Payer: Medicare HMO | Admitting: Physician Assistant

## 2022-02-12 ENCOUNTER — Encounter: Payer: Self-pay | Admitting: Physician Assistant

## 2022-02-12 VITALS — BP 136/73 | HR 89 | Ht 68.0 in | Wt 179.0 lb

## 2022-02-12 DIAGNOSIS — Z09 Encounter for follow-up examination after completed treatment for conditions other than malignant neoplasm: Secondary | ICD-10-CM

## 2022-02-12 DIAGNOSIS — L729 Follicular cyst of the skin and subcutaneous tissue, unspecified: Secondary | ICD-10-CM

## 2022-02-12 NOTE — Progress Notes (Signed)
[facility] POST-OP OFFICE VISIT  02/12/2022  HPI: Alan Ball is a 71 y.o. male 8 days s/p excision of left buttock cyst (2 cm) with Dr Hampton Abbot   Doing very well No pain No fever, chills, or drainage Bruising at wound No other complaints   Vital signs: BP 136/73   Pulse 89   Ht '5\' 8"'$  (1.727 m)   Wt 179 lb (81.2 kg)   SpO2 97%   BMI 27.22 kg/m    Physical Exam: Constitutional: Well appearing male, NAD Skin: 2 cm incision to the left buttock, healing well, no erythema, no drainage. Ecchymosis is various healing stages  Assessment/Plan: This is a 71 y.o. male 8 days s/p excision of left buttock cyst (2 cm) with Dr Hampton Abbot    - Pain control prn  - Reviewed wound care recommendation  - Reviewed surgical pathology; Pilar cyst   - He can follow up on as needed basis; He understands to call with questions/concerns  -- Edison Simon, PA-C  [facility] 02/12/2022, 3:21 PM M-F: 7am - 4pm

## 2022-02-12 NOTE — Patient Instructions (Signed)
   Follow-up with our office as needed.  Please call and ask to speak with a nurse if you develop questions or concerns.  

## 2022-02-13 DIAGNOSIS — C44311 Basal cell carcinoma of skin of nose: Secondary | ICD-10-CM | POA: Diagnosis not present

## 2022-02-13 DIAGNOSIS — L218 Other seborrheic dermatitis: Secondary | ICD-10-CM | POA: Diagnosis not present

## 2022-02-20 ENCOUNTER — Telehealth: Payer: Self-pay | Admitting: Lab

## 2022-02-20 NOTE — Telephone Encounter (Signed)
Received fax from [facility] report in Dr. Mancel Bale folder to be reviewed on 02/20/2022

## 2022-02-26 ENCOUNTER — Ambulatory Visit: Payer: Medicare HMO | Admitting: Physician Assistant

## 2022-02-26 ENCOUNTER — Encounter: Payer: Self-pay | Admitting: Physician Assistant

## 2022-02-26 VITALS — BP 130/78 | HR 78 | Ht 68.0 in | Wt 176.4 lb

## 2022-02-26 DIAGNOSIS — Z86010 Personal history of colonic polyps: Secondary | ICD-10-CM

## 2022-02-26 DIAGNOSIS — Z8719 Personal history of other diseases of the digestive system: Secondary | ICD-10-CM

## 2022-02-26 MED ORDER — NA SULFATE-K SULFATE-MG SULF 17.5-3.13-1.6 GM/177ML PO SOLN: 1.0000 | Freq: Once | ORAL | 0 refills | Status: AC

## 2022-02-26 NOTE — Progress Notes (Signed)
Chief Complaint: Abdominal cramping  HPI:    Alan Ball is a 71 year old male with a past medical history as listed below, known to Dr. Silverio Decamp, who presents to clinic today for abdominal cramping.    03/13/2019 colonoscopy with three 5-11 mm polyps in the rectum and in the ascending colon, nonbleeding internal hemorrhoids and otherwise normal.  EGD on the same day with mucosal changes suspicious for short segment Barrett's esophagus and a small hiatal hernia.  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  Repeat EGD and colonoscopy recommended in 3 years.    10/03/2020 office visit with Dr. Silverio Decamp.  At that time following up for postprandial abdominal bloating.  At that time discussed history of chronic gastritis and Barrett's esophagus, recommended surveillance EGD in 2023 as well as colonoscopy for surveillance of adenomatous polyps.  Continued on Omeprazole 20 mg daily and told to do a trial of a lactose-free diet as well as trial FDgard 1 capsule up to 3 times a day.  Recommended using abdominal wall binder with any activity or exercise given abdominal wall hernia.    01/16/2022 CMP normal.    Today, the patient tells me that about a month or 2 ago when he called to get an appointment he was having severe cramping in his stomach that felt like something was "twisting in there", he describes a similar feeling when he had COVID back in August 2022, but had no other symptoms other than the cramping.  His wife tells me it really put him out, he was in the bed for at least a week or so, his symptoms then seemed to resolve on their own.  There was no diarrhea, nausea or vomiting.  Patient is not sure what triggered it.  Now he is back to his normal self and feels fine but tells me he knows he is due for surveillance EGD and colonoscopy and would like to schedule them.    Patient is undergoing work-up from neurology in regards to a hand tremor.  They recently changed  his medications.    Denies fever, chills, weight loss, blood in his stool or symptoms that awaken him from sleep.  Past Medical History:  Diagnosis Date   Allergy    Asthma    Hyperlipemia    Hypertension    Tremor     Past Surgical History:  Procedure Laterality Date   CARPAL TUNNEL RELEASE Bilateral 2019, 2020   CATARACT EXTRACTION BILATERAL W/ ANTERIOR VITRECTOMY Bilateral    cateracts     HERNIA REPAIR Bilateral    inguinal   SHOULDER SURGERY      Current Outpatient Medications  Medication Sig Dispense Refill   albuterol (VENTOLIN HFA) 108 (90 Base) MCG/ACT inhaler Inhale 1-2 puffs into the lungs every 4 (four) hours as needed for wheezing or shortness of breath. 18 g 0   amantadine (SYMMETREL) 100 MG capsule Take 100 mg by mouth 2 (two) times daily.     amLODipine (NORVASC) 5 MG tablet Take 1 tablet (5 mg total) by mouth daily. 90 tablet 3   atorvastatin (LIPITOR) 10 MG tablet Take 1 tablet (10 mg total) by mouth daily. 90 tablet 3   carbidopa-levodopa (SINEMET IR) 25-100 MG tablet Take by mouth.     Carbidopa-Levodopa ER (SINEMET CR) 25-100 MG tablet controlled release Take 1 tablet by mouth daily. Notes he does not take this daily     ibuprofen (ADVIL,MOTRIN) 200 MG tablet Take 200 mg by mouth  every 6 (six) hours as needed for pain.     Multiple Vitamin (MULTIVITAMIN) tablet Take 1 tablet by mouth daily.     omeprazole (PRILOSEC) 20 MG capsule Take 1 capsule (20 mg total) by mouth daily. 90 capsule 3   sildenafil (VIAGRA) 50 MG tablet Take 1 tablet by mouth daily as needed.     vitamin C (ASCORBIC ACID) 500 MG tablet Take 500 mg by mouth daily.     No current facility-administered medications for this visit.    Allergies as of 02/26/2022   (No Known Allergies)    Family History  Problem Relation Age of Onset   Hypertension Mother    Thyroid disease Mother    Cancer Father        brain tumor (gleoplastoma)   Diabetes Father    Colon polyps Father    Colon  cancer Neg Hx    Stomach cancer Neg Hx    Rectal cancer Neg Hx    Esophageal cancer Neg Hx    Pancreatic cancer Neg Hx     Social History   Socioeconomic History   Marital status: Married    Spouse name: Romie Minus   Number of children: Not on file   Years of education: Not on file   Highest education level: Bachelor's degree (e.g., BA, AB, BS)  Occupational History    Comment: retired  Tobacco Use   Smoking status: Never    Passive exposure: Never   Smokeless tobacco: Never  Vaping Use   Vaping Use: Never used  Substance and Sexual Activity   Alcohol use: Yes    Comment: socially   Drug use: No   Sexual activity: Never  Other Topics Concern   Not on file  Social History Narrative   Lives with wife   Caffeine- coffee 3 cups daily, maybe a soda   Social Determinants of Health   Financial Resource Strain: Low Risk  (11/26/2021)   Overall Financial Resource Strain (CARDIA)    Difficulty of Paying Living Expenses: Not hard at all  Food Insecurity: No Food Insecurity (11/26/2021)   Hunger Vital Sign    Worried About Running Out of Food in the Last Year: Never true    Ran Out of Food in the Last Year: Never true  Transportation Needs: No Transportation Needs (11/26/2021)   PRAPARE - Hydrologist (Medical): No    Lack of Transportation (Non-Medical): No  Physical Activity: Sufficiently Active (11/26/2021)   Exercise Vital Sign    Days of Exercise per Week: 5 days    Minutes of Exercise per Session: 30 min  Stress: No Stress Concern Present (11/26/2021)   [place]    Feeling of Stress : Not at all  Social Connections: [place] (11/26/2021)   Social Connection and Isolation Panel [NHANES]    Frequency of Communication with Friends and Family: Twice a week    Frequency of Social Gatherings with Friends and Family: Twice a week    Attends Religious Services: More than 4 times  per year    Active Member of [employer] or Organizations: No    Attends Music therapist: 1 to 4 times per year    Marital Status: Married  Human resources officer Violence: Not At Risk (11/26/2021)   Humiliation, Afraid, Rape, and Kick questionnaire    Fear of Current or Ex-Partner: No    Emotionally Abused: No    Physically Abused: No  Sexually Abused: No    Review of Systems:    Constitutional: No weight loss, fever or chills Cardiovascular: No chest pain Respiratory: No SOB  Gastrointestinal: See HPI and otherwise negative   Physical Exam:  Vital signs: BP 130/78   Pulse 78   Ht '5\' 8"'$  (1.727 m)   Wt 176 lb 6.4 oz (80 kg)   SpO2 98%   BMI 26.82 kg/m    Constitutional:   Pleasant Caucasian male appears to be in NAD, Well developed, Well nourished, alert and cooperative Respiratory: Respirations even and unlabored. Lungs clear to auscultation bilaterally.   No wheezes, crackles, or rhonchi.  Cardiovascular: Normal S1, S2. No MRG. Regular rate and rhythm. No peripheral edema, cyanosis or pallor.  Gastrointestinal:  Soft, nondistended, nontender. No rebound or guarding. Normal bowel sounds. No appreciable masses or hepatomegaly. Rectal:  Not performed.  Psychiatric: Demonstrates good judgement and reason without abnormal affect or behaviors.  RELEVANT LABS AND IMAGING: CBC    Component Value Date/Time   WBC 7.1 02/13/2019 1452   RBC 5.22 02/13/2019 1452   HGB 15.5 02/13/2019 1452   HCT 45.0 02/13/2019 1452   PLT 332 02/13/2019 1452   MCV 86 02/13/2019 1452   MCH 29.7 02/13/2019 1452   MCHC 34.4 02/13/2019 1452   RDW 12.7 02/13/2019 1452    CMP     Component Value Date/Time   NA 139 01/16/2022 1226   NA 140 06/06/2020 1528   K 4.6 01/16/2022 1226   CL 103 01/16/2022 1226   CO2 30 01/16/2022 1226   GLUCOSE 94 01/16/2022 1226   BUN 15 01/16/2022 1226   BUN 13 06/06/2020 1528   CREATININE 0.94 01/16/2022 1226   CALCIUM 9.7 01/16/2022 1226   PROT 7.0  01/16/2022 1226   PROT 6.9 06/06/2020 1528   ALBUMIN 4.2 01/16/2022 1226   ALBUMIN 4.4 06/06/2020 1528   AST 21 01/16/2022 1226   ALT 5 01/16/2022 1226   ALKPHOS 62 01/16/2022 1226   BILITOT 0.9 01/16/2022 1226   BILITOT 0.7 06/06/2020 1528   GFRNONAA 80 06/06/2020 1528   GFRAA 93 06/06/2020 1528    Assessment: 1.  History of Barrett's esophagus with intestinal metaplasia: Negative for dysplasia, due for surveillance EGD 2.  History of colon polyps: Due for surveillance colonoscopy  Plan: 1.  Scheduled patient for a surveillance EGD and colonoscopy in the [place] with Dr. Silverio Decamp.  Did provide the patient a detailed list of risks for the procedures and he agrees to proceed.  Patient is appropriate for endoscopic procedure(s) in the ambulatory ([place]) setting.  2.  Patient tells me that since changing his meds for hand tremor he is finding his stool slightly harder to pass.  Recommend MiraLAX on a daily basis. 3.  Patient follow in clinic per recommendations from Dr. Silverio Decamp after time of procedures.  [name], PA-C [facility] 02/26/2022, 9:58 AM  Cc: [name], MD

## 2022-02-26 NOTE — Patient Instructions (Signed)
You have been scheduled for an endoscopy and colonoscopy. Please follow the written instructions given to you at your visit today. Please pick up your prep supplies at the pharmacy within the next 1-3 days. If you use inhalers (even only as needed), please bring them with you on the day of your procedure.  _______________________________________________________  If you are age 73 or older, your body mass index should be between 23-30. Your Body mass index is 26.82 kg/m. If this is out of the aforementioned range listed, please consider follow up with your Primary Care Provider.  If you are age 49 or younger, your body mass index should be between 19-25. Your Body mass index is 26.82 kg/m. If this is out of the aformentioned range listed, please consider follow up with your Primary Care Provider.   ________________________________________________________  The [facility] providers would like to encourage you to use [facility] to communicate with providers for non-urgent requests or questions.  Due to long hold times on the telephone, sending your provider a message by [facility] may be a faster and more efficient way to get a response.  Please allow 48 business hours for a response.  Please remember that this is for non-urgent requests.  _______________________________________________________

## 2022-03-18 ENCOUNTER — Ambulatory Visit (AMBULATORY_SURGERY_CENTER): Payer: Medicare HMO | Admitting: Gastroenterology

## 2022-03-18 ENCOUNTER — Encounter: Payer: Self-pay | Admitting: Gastroenterology

## 2022-03-18 VITALS — BP 124/57 | HR 70 | Temp 98.6°F | Resp 14 | Ht 68.0 in | Wt 176.0 lb

## 2022-03-18 DIAGNOSIS — K2970 Gastritis, unspecified, without bleeding: Secondary | ICD-10-CM

## 2022-03-18 DIAGNOSIS — K2270 Barrett's esophagus without dysplasia: Secondary | ICD-10-CM

## 2022-03-18 DIAGNOSIS — K219 Gastro-esophageal reflux disease without esophagitis: Secondary | ICD-10-CM | POA: Diagnosis not present

## 2022-03-18 DIAGNOSIS — Z86010 Personal history of colonic polyps: Secondary | ICD-10-CM

## 2022-03-18 DIAGNOSIS — K635 Polyp of colon: Secondary | ICD-10-CM | POA: Diagnosis not present

## 2022-03-18 DIAGNOSIS — Z8719 Personal history of other diseases of the digestive system: Secondary | ICD-10-CM | POA: Diagnosis not present

## 2022-03-18 DIAGNOSIS — I10 Essential (primary) hypertension: Secondary | ICD-10-CM | POA: Diagnosis not present

## 2022-03-18 DIAGNOSIS — G20A1 Parkinson's disease without dyskinesia, without mention of fluctuations: Secondary | ICD-10-CM | POA: Diagnosis not present

## 2022-03-18 DIAGNOSIS — D122 Benign neoplasm of ascending colon: Secondary | ICD-10-CM | POA: Diagnosis not present

## 2022-03-18 DIAGNOSIS — Z09 Encounter for follow-up examination after completed treatment for conditions other than malignant neoplasm: Secondary | ICD-10-CM | POA: Diagnosis not present

## 2022-03-18 DIAGNOSIS — K31A0 Gastric intestinal metaplasia, unspecified: Secondary | ICD-10-CM | POA: Diagnosis not present

## 2022-03-18 DIAGNOSIS — K449 Diaphragmatic hernia without obstruction or gangrene: Secondary | ICD-10-CM | POA: Diagnosis not present

## 2022-03-18 DIAGNOSIS — K2100 Gastro-esophageal reflux disease with esophagitis, without bleeding: Secondary | ICD-10-CM | POA: Diagnosis not present

## 2022-03-18 DIAGNOSIS — E669 Obesity, unspecified: Secondary | ICD-10-CM | POA: Diagnosis not present

## 2022-03-18 MED ORDER — SODIUM CHLORIDE 0.9 % IV SOLN: 500.0000 mL | Freq: Once | INTRAVENOUS | Status: DC

## 2022-03-18 NOTE — Progress Notes (Signed)
Report to PACU, RN, vss, BBS= Clear.  

## 2022-03-18 NOTE — Op Note (Signed)
[facility] Patient Name: Alan Ball Procedure Date: 03/18/2022 10:27 AM MRN: [id number] Endoscopist: Mauri Pole , MD, [id number] Age: 71 Referring MD:  Date of Birth: [birth year] Gender: Male Account #: [id number] Procedure:                Upper GI endoscopy Indications:              Surveillance for malignancy due to personal history                            of Barrett's esophagus Medicines:                Monitored Anesthesia Care Procedure:                Pre-Anesthesia Assessment:                           - Prior to the procedure, a History and Physical                            was performed, and patient medications and                            allergies were reviewed. The patient's tolerance of                            previous anesthesia was also reviewed. The risks                            and benefits of the procedure and the sedation                            options and risks were discussed with the patient.                            All questions were answered, and informed consent                            was obtained. Prior Anticoagulants: The patient has                            taken no anticoagulant or antiplatelet agents. ASA                            Grade Assessment: II - A patient with mild systemic                            disease. After reviewing the risks and benefits,                            the patient was deemed in satisfactory condition to                            undergo the procedure.  After obtaining informed consent, the endoscope was                            passed under direct vision. Throughout the                            procedure, the patient's blood pressure, pulse, and                            oxygen saturations were monitored continuously. The                            GIF HQ190 #[id number] was introduced through the                            mouth, and advanced to the  second part of duodenum.                            The upper GI endoscopy was accomplished without                            difficulty. The patient tolerated the procedure                            well. Scope In: Scope Out: Findings:                 There were esophageal mucosal changes secondary to                            established short-segment Barrett's disease present                            in the lower third of the esophagus. The maximum                            longitudinal extent of these mucosal changes was 3                            cm in length. Mucosa was biopsied with a cold                            forceps for histology in a targeted manner 3 cm                            proximal to the GE junction. One specimen bottle                            was sent to pathology.                           A small hiatal hernia was present.  One benign-appearing, intrinsic mild                            (non-circumferential scarring) stenosis was found                            36 to 37 cm from the incisors. This stenosis                            measured greater than or equal to 2 cm (inner                            diameter). The stenosis was traversed.                           The stomach was normal.                           The cardia and gastric fundus were normal on                            retroflexion.                           The examined duodenum was normal. Complications:            No immediate complications. Estimated Blood Loss:     Estimated blood loss was minimal. Impression:               - Esophageal mucosal changes secondary to                            established short-segment Barrett's disease.                            Biopsied.                           - Small hiatal hernia.                           - Benign-appearing esophageal stenosis.                           - Normal stomach.                            - Normal examined duodenum. Recommendation:           - Patient has a contact number available for                            emergencies. The signs and symptoms of potential                            delayed complications were discussed with the  patient. Return to normal activities tomorrow.                            Written discharge instructions were provided to the                            patient.                           - Resume previous diet.                           - Continue present medications.                           - Await pathology results.                           - Repeat upper endoscopy in 3-5 years for                            surveillance of Barrett's esophagus. Mauri Pole, MD 03/18/2022 11:24:02 AM This report has been signed electronically.

## 2022-03-18 NOTE — Op Note (Signed)
[facility] Patient Name: Malahki Gasaway Procedure Date: 03/18/2022 10:27 AM MRN: [id number] Endoscopist: Mauri Pole , MD, [id number] Age: 71 Referring MD:  Date of Birth: 01-Feb-1951 Gender: Male Account #: [id number] Procedure:                Colonoscopy Indications:              High risk colon cancer surveillance: Personal                            history of colonic polyps, High risk colon cancer                            surveillance: Personal history of sessile serrated                            colon polyp (10 mm or greater in size) Medicines:                Monitored Anesthesia Care Procedure:                Pre-Anesthesia Assessment:                           - Prior to the procedure, a History and Physical                            was performed, and patient medications and                            allergies were reviewed. The patient's tolerance of                            previous anesthesia was also reviewed. The risks                            and benefits of the procedure and the sedation                            options and risks were discussed with the patient.                            All questions were answered, and informed consent                            was obtained. Prior Anticoagulants: The patient has                            taken no anticoagulant or antiplatelet agents. ASA                            Grade Assessment: II - A patient with mild systemic                            disease. After reviewing the risks and benefits,  the patient was deemed in satisfactory condition to                            undergo the procedure.                           After obtaining informed consent, the colonoscope                            was passed under direct vision. Throughout the                            procedure, the patient's blood pressure, pulse, and                            oxygen saturations  were monitored continuously. The                            Olympus [id number]-[id number] ([id number]) Colonoscope was                            introduced through the anus and advanced to the the                            cecum, identified by appendiceal orifice and                            ileocecal valve. The colonoscopy was performed                            without difficulty. The patient tolerated the                            procedure well. The quality of the bowel                            preparation was adequate. The ileocecal valve,                            appendiceal orifice, and rectum were photographed. Scope In: 10:54:36 AM Scope Out: 11:09:32 AM Scope Withdrawal Time: 0 hours 12 minutes 8 seconds  Total Procedure Duration: 0 hours 14 minutes 56 seconds  Findings:                 The perianal and digital rectal examinations were                            normal.                           A 10 mm polyp was found in the ascending colon. The                            polyp was sessile. The polyp was removed with a  cold snare. Resection and retrieval were complete.                           A few small-mouthed diverticula were found in the                            sigmoid colon.                           Non-bleeding external and internal hemorrhoids were                            found during retroflexion. The hemorrhoids were                            large. Complications:            No immediate complications. Estimated Blood Loss:     Estimated blood loss was minimal. Impression:               - One 10 mm polyp in the ascending colon, removed                            with a cold snare. Resected and retrieved.                           - Diverticulosis in the sigmoid colon.                           - Non-bleeding external and internal hemorrhoids. Recommendation:           - Patient has a contact number available for                             emergencies. The signs and symptoms of potential                            delayed complications were discussed with the                            patient. Return to normal activities tomorrow.                            Written discharge instructions were provided to the                            patient.                           - Resume previous diet.                           - Continue present medications.                           - Await pathology results.                           -  Repeat colonoscopy in 3 - 5 years for                            surveillance based on pathology results. Mauri Pole, MD 03/18/2022 11:21:10 AM This report has been signed electronically.

## 2022-03-18 NOTE — Progress Notes (Signed)
Called to room to assist during endoscopic procedure.  Patient ID and intended procedure confirmed with present staff. Received instructions for my participation in the procedure from the performing physician.  

## 2022-03-18 NOTE — Progress Notes (Signed)
  Please refer to office visit note 02/26/22. No additional changes in H&P Patient is appropriate for planned procedure(s) and anesthesia in an ambulatory setting  K. Denzil Magnuson , MD [id number]

## 2022-03-18 NOTE — Progress Notes (Signed)
Pt's states no medical or surgical changes since previsit or office visit. VS assessed by C.W 

## 2022-03-18 NOTE — Patient Instructions (Signed)
Handout provided on hiatal hernia, Barrett's esophagus, polyps, diverticulosis and hemorrhoids.   Resume previous diet. Continue present medications.  Await pathology results.  Repeat colonoscopy in 3-5 years for surveillance based on pathology results.  Repeat upper endoscopy in 3-5 years for surveillance of Barrett's esophagus.   YOU HAD AN ENDOSCOPIC PROCEDURE TODAY AT [facility]:   Refer to the procedure report that was given to you for any specific questions about what was found during the examination.  If the procedure report does not answer your questions, please call your gastroenterologist to clarify.  If you requested that your care partner not be given the details of your procedure findings, then the procedure report has been included in a sealed envelope for you to review at your convenience later.  YOU SHOULD EXPECT: Some feelings of bloating in the abdomen. Passage of more gas than usual.  Walking can help get rid of the air that was put into your GI tract during the procedure and reduce the bloating. If you had a lower endoscopy (such as a colonoscopy or flexible sigmoidoscopy) you may notice spotting of blood in your stool or on the toilet paper. If you underwent a bowel prep for your procedure, you may not have a normal bowel movement for a few days.  Please Note:  You might notice some irritation and congestion in your nose or some drainage.  This is from the oxygen used during your procedure.  There is no need for concern and it should clear up in a day or so.  SYMPTOMS TO REPORT IMMEDIATELY:  Following lower endoscopy (colonoscopy or flexible sigmoidoscopy):  Excessive amounts of blood in the stool  Significant tenderness or worsening of abdominal pains  Swelling of the abdomen that is new, acute  Fever of 100F or higher  Following upper endoscopy (EGD)  Vomiting of blood or coffee ground material  New chest pain or pain under the shoulder  blades  Painful or persistently difficult swallowing  New shortness of breath  Fever of 100F or higher  Black, tarry-looking stools  For urgent or emergent issues, a gastroenterologist can be reached at any hour by calling [phone]. Do not use MyChart messaging for urgent concerns.    DIET:  We do recommend a small meal at first, but then you may proceed to your regular diet.  Drink plenty of fluids but you should avoid alcoholic beverages for 24 hours.  ACTIVITY:  You should plan to take it easy for the rest of today and you should NOT DRIVE or use heavy machinery until tomorrow (because of the sedation medicines used during the test).    FOLLOW UP: Our staff will call the number listed on your records the next business day following your procedure.  We will call around 7:15- 8:00 am to check on you and address any questions or concerns that you may have regarding the information given to you following your procedure. If we do not reach you, we will leave a message.     If any biopsies were taken you will be contacted by phone or by letter within the next 1-3 weeks.  Please call us at [phone] if you have not heard about the biopsies in 3 weeks.    SIGNATURES/CONFIDENTIALITY: You and/or your care partner have signed paperwork which will be entered into your electronic medical record.  These signatures attest to the fact that that the information above on your After Visit Summary has been reviewed and  is understood.  Full responsibility of the confidentiality of this discharge information lies with you and/or your care-partner.

## 2022-03-19 ENCOUNTER — Telehealth: Payer: Self-pay

## 2022-03-19 NOTE — Telephone Encounter (Signed)
  Follow up Call-     03/18/2022   10:31 AM  Call back number  Post procedure Call Back phone  # [phone]  Permission to leave phone message Yes     Patient questions:  Do you have a fever, pain , or abdominal swelling? No. Pain Score  0 *  Have you tolerated food without any problems? Yes.    Have you been able to return to your normal activities? Yes.    Do you have any questions about your discharge instructions: Diet   No. Medications  No. Follow up visit  No.  Do you have questions or concerns about your Care? No.  Actions: * If pain score is 4 or above: No action needed, pain <4.

## 2022-03-24 ENCOUNTER — Encounter: Payer: Self-pay | Admitting: Gastroenterology

## 2022-03-31 DIAGNOSIS — Z85828 Personal history of other malignant neoplasm of skin: Secondary | ICD-10-CM | POA: Diagnosis not present

## 2022-03-31 DIAGNOSIS — Z08 Encounter for follow-up examination after completed treatment for malignant neoplasm: Secondary | ICD-10-CM | POA: Diagnosis not present

## 2022-04-09 ENCOUNTER — Encounter: Payer: Self-pay | Admitting: Gastroenterology

## 2022-04-10 ENCOUNTER — Encounter: Payer: Self-pay | Admitting: Podiatry

## 2022-04-10 ENCOUNTER — Ambulatory Visit: Payer: Medicare HMO | Admitting: Podiatry

## 2022-04-10 DIAGNOSIS — B351 Tinea unguium: Secondary | ICD-10-CM | POA: Diagnosis not present

## 2022-04-10 DIAGNOSIS — M79675 Pain in left toe(s): Secondary | ICD-10-CM

## 2022-04-10 DIAGNOSIS — M79674 Pain in right toe(s): Secondary | ICD-10-CM | POA: Diagnosis not present

## 2022-04-10 NOTE — Progress Notes (Signed)
This patient presents to the office with chief complaint of long thick painful nails.  Patient says the nails are painful walking and wearing shoes.  This patient is unable to self treat.  This patient is unable to trim his  nails since he is unable to reach his nails.  he presents to the office for preventative foot care services. He presents to the office with his wife. Patient also relates callus in nail groove.  General Appearance  Alert, conversant and in no acute stress.  Vascular  Dorsalis pedis and posterior tibial  pulses are palpable  bilaterally.  Capillary return is within normal limits  bilaterally. Temperature is within normal limits  bilaterally.  Neurologic  Senn-Weinstein monofilament wire test within normal limits  bilaterally. Muscle power within normal limits bilaterally.  Nails Thick disfigured discolored nails with subungual debris  hallux nails bilaterally. No evidence of bacterial infection or drainage bilaterally.  Orthopedic  No limitations of motion  feet .  No crepitus or effusions noted.  HAV  B/L.  Hammer toes  B/L.  Skin  normotropic skin with no porokeratosis noted bilaterally.  No signs of infections or ulcers noted.     Onychomycosis  Nails  B/L.  Pain in right toes  Pain in left toes  Debridement of nails both feet followed trimming the nails with dremel tool.    RTC 3 months.   Gardiner Barefoot DPM

## 2022-06-14 ENCOUNTER — Ambulatory Visit
Admission: EM | Admit: 2022-06-14 | Discharge: 2022-06-14 | Disposition: A | Discharge status: Home / Self Care | Payer: Medicare HMO | Attending: Internal Medicine | Admitting: Internal Medicine

## 2022-06-14 DIAGNOSIS — J0100 Acute maxillary sinusitis, unspecified: Secondary | ICD-10-CM

## 2022-06-14 MED ORDER — AMOXICILLIN-POT CLAVULANATE 875-125 MG PO TABS: 1.0000 | ORAL_TABLET | Freq: Two times a day (BID) | ORAL | 0 refills | Status: DC

## 2022-06-14 NOTE — ED Provider Notes (Addendum)
[name]    CSN: [id number] Arrival date & time: 06/14/22  1148      History   Chief Complaint Chief Complaint  Patient presents with   Facial Pain    HPI Alan Ball is a 72 y.o. male.   Patient presents with nasal congestion that has been present for 10 days.  Patient reports over the past 2 days, he has developed green mucus coming out of his nose as well as left-sided facial pain.  He reports that he had a productive cough at the start of symptoms but this has now resolved.  He denies any fever.  Reports that his family member had similar symptoms when symptoms first started.  He has taken over-the-counter cold and flu medication with no improvement of symptoms.  Denies chest pain or shortness of breath.  Denies history of asthma or COPD and patient does not smoke cigarettes.     Past Medical History:  Diagnosis Date   Allergy    Asthma    Hyperlipemia    Hypertension    Melanoma ([place]) 02/2022   on left side of nose   Tremor     Patient Active Problem List   Diagnosis Date Noted   Mass of soft tissue of left buttock 123XX123   Umbilical hernia 123XX123   Gastritis 07/29/2020   Parkinsonism 10/11/2019   Cervical radiculopathy 02/21/2019   DDD (degenerative disc disease), cervical 02/21/2019   Bilateral carpal tunnel syndrome 05/19/2018   Trigger middle finger of right hand 12/20/2017   De Quervain's tenosynovitis, right 12/20/2017   Carpal tunnel syndrome of right wrist 12/20/2017   Renal cyst, right 09/05/2015   Inguinal hernia 10/04/2012    Past Surgical History:  Procedure Laterality Date   CARPAL TUNNEL RELEASE Bilateral 2019, 2020   CATARACT EXTRACTION BILATERAL W/ ANTERIOR VITRECTOMY Bilateral    cateracts     HERNIA REPAIR Bilateral    inguinal   SHOULDER SURGERY         Home Medications    Prior to Admission medications   Medication Sig Start Date End Date Taking? Authorizing Provider  amoxicillin-clavulanate  (AUGMENTIN) 875-125 MG tablet Take 1 tablet by mouth every 12 (twelve) hours. 06/14/22  Yes Jene Huq, Michele Rockers, FNP  albuterol (VENTOLIN HFA) 108 (90 Base) MCG/ACT inhaler Inhale 1-2 puffs into the lungs every 4 (four) hours as needed for wheezing or shortness of breath. 07/16/21   [name], MD  amantadine (SYMMETREL) 100 MG capsule Take 100 mg by mouth 2 (two) times daily. 01/27/22   [provider]  amantadine (SYMMETREL) 100 MG capsule Take by mouth. 01/27/22   [provider]  amLODipine (NORVASC) 5 MG tablet Take 1 tablet (5 mg total) by mouth daily. 01/16/22   [name], MD  amLODipine (NORVASC) 5 MG tablet Take 1 tablet by mouth daily. 02/13/19   [provider]  atorvastatin (LIPITOR) 10 MG tablet Take 1 tablet (10 mg total) by mouth daily. 01/16/22   [name], MD  carbidopa-levodopa (SINEMET IR) 25-100 MG tablet Take by mouth. 01/31/19   [provider]  Carbidopa-Levodopa ER (SINEMET CR) 25-100 MG tablet controlled release Take 1 tablet by mouth daily. Notes he does not take this daily 04/02/21   [provider]  fluticasone (CUTIVATE) 0.05 % cream Apply topically 2 (two) times daily as needed. 02/13/22   [provider]  ibuprofen (ADVIL,MOTRIN) 200 MG tablet Take 200 mg by mouth every 6 (six) hours as  needed for pain.    [provider]  Multiple Vitamin (MULTIVITAMIN) tablet Take 1 tablet by mouth daily. Patient not taking: Reported on 03/18/2022    [provider]  omeprazole (PRILOSEC) 20 MG capsule Take 1 capsule (20 mg total) by mouth daily. 01/16/22   [name], MD  sildenafil (VIAGRA) 50 MG tablet Take 1 tablet by mouth daily as needed. Patient not taking: Reported on 02/26/2022    [provider]  vitamin C (ASCORBIC ACID) 500 MG tablet Take 500 mg by mouth daily.    [provider]    Family History Family History  Problem Relation Age of Onset   Hypertension  Mother    Thyroid disease Mother    Cancer Father        brain tumor (gleoplastoma)   Diabetes Father    Colon polyps Father    Colon cancer Neg Hx    Stomach cancer Neg Hx    Rectal cancer Neg Hx    Esophageal cancer Neg Hx    Pancreatic cancer Neg Hx     Social History Social History   Tobacco Use   Smoking status: Never    Passive exposure: Never   Smokeless tobacco: Never  Vaping Use   Vaping Use: Never used  Substance Use Topics   Alcohol use: Yes    Comment: socially   Drug use: No     Allergies   Patient has no known allergies.   Review of Systems Review of Systems Per HPI  Physical Exam Triage Vital Signs ED Triage Vitals [06/14/22 1309]  Enc Vitals Group     BP (!) 149/78     Pulse Rate 73     Resp 16     Temp (!) 97.4 F (36.3 C)     Temp src      SpO2 98 %     Weight      Height      Head Circumference      Peak Flow      Pain Score 0     Pain Loc      Pain Edu?      Excl. in [place]?    No data found.  Updated Vital Signs BP (!) 149/78   Pulse 73   Temp (!) 97.4 F (36.3 C)   Resp 16   SpO2 98%   Visual Acuity Right Eye Distance:   Left Eye Distance:   Bilateral Distance:    Right Eye Near:   Left Eye Near:    Bilateral Near:     Physical Exam Constitutional:      General: He is not in acute distress.    Appearance: Normal appearance. He is not toxic-appearing or diaphoretic.  HENT:     Head: Normocephalic and atraumatic.     Right Ear: Tympanic membrane and ear canal normal.     Left Ear: Tympanic membrane and ear canal normal.     Nose: Congestion present.     Right Sinus: No maxillary sinus tenderness or frontal sinus tenderness.     Left Sinus: Maxillary sinus tenderness present. No frontal sinus tenderness.     Mouth/Throat:     Mouth: Mucous membranes are moist.     Pharynx: No posterior oropharyngeal erythema.  Eyes:     Extraocular Movements: Extraocular movements intact.     Conjunctiva/sclera: Conjunctivae  normal.     Pupils: Pupils are equal, round, and reactive to light.  Cardiovascular:  Rate and Rhythm: Normal rate and regular rhythm.     Pulses: Normal pulses.     Heart sounds: Normal heart sounds.  Pulmonary:     Effort: Pulmonary effort is normal. No respiratory distress.     Breath sounds: Normal breath sounds. No stridor. No wheezing, rhonchi or rales.  Abdominal:     General: Abdomen is flat. Bowel sounds are normal.     Palpations: Abdomen is soft.  Musculoskeletal:        General: Normal range of motion.     Cervical back: Normal range of motion.  Skin:    General: Skin is warm and dry.  Neurological:     General: No focal deficit present.     Mental Status: He is alert and oriented to person, place, and time. Mental status is at baseline.  Psychiatric:        Mood and Affect: Mood normal.        Behavior: Behavior normal.      UC Treatments / Results  Labs (all labs ordered are listed, but only abnormal results are displayed) Labs Reviewed - No data to display  EKG   Radiology No results found.  Procedures Procedures (including critical care time)  Medications Ordered in UC Medications - No data to display  Initial Impression / Assessment and Plan / UC Course  I have reviewed the triage vital signs and the nursing notes.  Pertinent labs & imaging results that were available during my care of the patient were reviewed by me and considered in my medical decision making (see chart for details).     Patient's symptoms most likely started off as a viral illness, but given duration of symptoms and maxillary sinus tenderness I am concerned for sinus infection/secondary bacterial infection.  Therefore, will opt to treat with Augmentin antibiotic.  Patient denies current cough and there are no adventitious lung sounds on exam so do not think that emergent evaluation or imaging of the chest is necessary.  Advised supportive care.  Patient advised to follow-up if  any symptoms persist or worsen.  Do not think viral testing is necessary given duration of symptoms as it would not change treatment.  Patient verbalized understanding and was agreeable with plan.  Patient's chart states that he has a legal guardian but he adamantly declines this stating that this is present in his chart given that his wife and daughter are able to get medical information if necessary. Final Clinical Impressions(s) / UC Diagnoses   Final diagnoses:  Acute non-recurrent maxillary sinusitis     Discharge Instructions      I am treating you with an antibiotic for sinus infection.  Please follow-up if any symptoms persist or worsen.    ED Prescriptions     Medication Sig Dispense Auth. Provider   amoxicillin-clavulanate (AUGMENTIN) 875-125 MG tablet Take 1 tablet by mouth every 12 (twelve) hours. 14 tablet [place], Michele Rockers, [place]      PDMP not reviewed this encounter.   Teodora Medici, [place] 06/14/22 [place], [place], [place] 06/14/22 1343

## 2022-06-14 NOTE — ED Triage Notes (Signed)
Pt presents to uc with co of congestion for 10 days with increase in symptoms the last two days with sinus pressure and pain. Pt has been taken otc medications

## 2022-06-14 NOTE — Discharge Instructions (Signed)
I am treating you with an antibiotic for sinus infection.  Please follow-up if any symptoms persist or worsen.

## 2022-07-01 ENCOUNTER — Ambulatory Visit
Admission: EM | Admit: 2022-07-01 | Discharge: 2022-07-01 | Disposition: A | Discharge status: Home / Self Care | Payer: Medicare HMO | Attending: Physician Assistant | Admitting: Physician Assistant

## 2022-07-01 ENCOUNTER — Other Ambulatory Visit: Payer: Self-pay | Admitting: Family Medicine

## 2022-07-01 DIAGNOSIS — J069 Acute upper respiratory infection, unspecified: Secondary | ICD-10-CM | POA: Diagnosis not present

## 2022-07-01 DIAGNOSIS — J0100 Acute maxillary sinusitis, unspecified: Secondary | ICD-10-CM

## 2022-07-01 DIAGNOSIS — J9801 Acute bronchospasm: Secondary | ICD-10-CM

## 2022-07-01 MED ORDER — AZITHROMYCIN 250 MG PO TABS: ORAL_TABLET | ORAL | 0 refills | Status: DC

## 2022-07-01 NOTE — ED Provider Notes (Signed)
[name]    CSN: [id number] Arrival date & time: 07/01/22  [id number]      History   Chief Complaint Chief Complaint  Patient presents with   Cough    Cough, chest congestion and facial pain.     HPI Alan Ball is a 72 y.o. male.   70-year-old male presents with sinus congestion, cough.  Patient indicates for the past 2 weeks he has had upper respiratory congestion with frontal and maxillary sinus pressure and pain which has been intermittent.  He also indicates having nasal congestion, postnasal drip, production has been purulent over the past couple days.  Patient indicates he has had mild chest congestion with intermittent cough.  He indicates he has had some mild intermittent wheezing which has resolved with his albuterol inhaler.  He indicates he is not having fever, chills, body aches or discomfort.  He is tolerating fluids well.  He indicates he is just not getting enough improvement with his Nasacort and OTC cold preparations.   Cough Associated symptoms: rhinorrhea     Past Medical History:  Diagnosis Date   Allergy    Asthma    Hyperlipemia    Hypertension    Melanoma ([place]) 02/2022   on left side of nose   Tremor     Patient Active Problem List   Diagnosis Date Noted   Mass of soft tissue of left buttock 123XX123   Umbilical hernia 123XX123   Gastritis 07/29/2020   Parkinsonism 10/11/2019   Cervical radiculopathy 02/21/2019   DDD (degenerative disc disease), cervical 02/21/2019   Bilateral carpal tunnel syndrome 05/19/2018   Trigger middle finger of right hand 12/20/2017   De Quervain's tenosynovitis, right 12/20/2017   Carpal tunnel syndrome of right wrist 12/20/2017   Renal cyst, right 09/05/2015   Inguinal hernia 10/04/2012    Past Surgical History:  Procedure Laterality Date   CARPAL TUNNEL RELEASE Bilateral 2019, 2020   CATARACT EXTRACTION BILATERAL W/ ANTERIOR VITRECTOMY Bilateral    cateracts     HERNIA REPAIR Bilateral     inguinal   SHOULDER SURGERY         Home Medications    Prior to Admission medications   Medication Sig Start Date End Date Taking? Authorizing Provider  albuterol (VENTOLIN HFA) 108 (90 Base) MCG/ACT inhaler Inhale 1-2 puffs into the lungs every 4 (four) hours as needed for wheezing or shortness of breath. 07/16/21  Yes [name], MD  amantadine (SYMMETREL) 100 MG capsule Take 100 mg by mouth 2 (two) times daily. 01/27/22  Yes [provider]  amantadine (SYMMETREL) 100 MG capsule Take by mouth. 01/27/22  Yes [provider]  amLODipine (NORVASC) 5 MG tablet Take 1 tablet (5 mg total) by mouth daily. 01/16/22  Yes [name], MD  amLODipine (NORVASC) 5 MG tablet Take 1 tablet by mouth daily. 02/13/19  Yes [provider]  amoxicillin-clavulanate (AUGMENTIN) 875-125 MG tablet Take 1 tablet by mouth every 12 (twelve) hours. 06/14/22  Yes [place], Hildred Alamin E, FNP  atorvastatin (LIPITOR) 10 MG tablet Take 1 tablet (10 mg total) by mouth daily. 01/16/22  Yes [name], MD  azithromycin (ZITHROMAX Z-PAK) 250 MG tablet 2 tablets initially and then 1 tablet daily until completed. 07/01/22  Yes Nyoka Lint, PA-C  Carbidopa-Levodopa ER (SINEMET CR) 25-100 MG tablet controlled release Take 1 tablet by mouth daily. Notes he does not take this daily 04/02/21  Yes [provider]  fluticasone (CUTIVATE) 0.05 % cream  Apply topically 2 (two) times daily as needed. 02/13/22  Yes [provider]  ibuprofen (ADVIL,MOTRIN) 200 MG tablet Take 200 mg by mouth every 6 (six) hours as needed for pain.   Yes [provider]  omeprazole (PRILOSEC) 20 MG capsule Take 1 capsule (20 mg total) by mouth daily. 01/16/22  Yes [name], MD  vitamin C (ASCORBIC ACID) 500 MG tablet Take 500 mg by mouth daily.   Yes [provider]  carbidopa-levodopa (SINEMET IR) 25-100 MG tablet Take by mouth. 01/31/19   [provider]  Multiple  Vitamin (MULTIVITAMIN) tablet Take 1 tablet by mouth daily. Patient not taking: Reported on 03/18/2022    [provider]  sildenafil (VIAGRA) 50 MG tablet Take 1 tablet by mouth daily as needed. Patient not taking: Reported on 02/26/2022    [provider]    Family History Family History  Problem Relation Age of Onset   Hypertension Mother    Thyroid disease Mother    Cancer Father        brain tumor (gleoplastoma)   Diabetes Father    Colon polyps Father    Colon cancer Neg Hx    Stomach cancer Neg Hx    Rectal cancer Neg Hx    Esophageal cancer Neg Hx    Pancreatic cancer Neg Hx     Social History Social History   Tobacco Use   Smoking status: Never    Passive exposure: Never   Smokeless tobacco: Never  Vaping Use   Vaping Use: Never used  Substance Use Topics   Alcohol use: Yes    Comment: socially   Drug use: No     Allergies   Patient has no known allergies.   Review of Systems Review of Systems  HENT:  Positive for postnasal drip, rhinorrhea and sinus pressure.   Respiratory:  Positive for cough.      Physical Exam Triage Vital Signs ED Triage Vitals  Enc Vitals Group     BP 07/01/22 1032 131/78     Pulse Rate 07/01/22 1032 77     Resp 07/01/22 1032 18     Temp 07/01/22 1032 97.8 F (36.6 C)     Temp Source 07/01/22 1032 Oral     SpO2 07/01/22 1032 95 %     Weight 07/01/22 1031 170 lb (77.1 kg)     Height 07/01/22 1031 '5\' 9"'$  (1.753 m)     Head Circumference --      Peak Flow --      Pain Score 07/01/22 1031 0     Pain Loc --      Pain Edu? --      Excl. in [place]? --    No data found.  Updated Vital Signs BP 131/78 (BP Location: Right Arm)   Pulse 77   Temp 97.8 F (36.6 C) (Oral)   Resp 18   Ht '5\' 9"'$  (1.753 m)   Wt 170 lb (77.1 kg)   SpO2 95%   BMI 25.10 kg/m   Visual Acuity Right Eye Distance:   Left Eye Distance:   Bilateral Distance:    Right Eye Near:   Left Eye Near:    Bilateral Near:     Physical  Exam Constitutional:      Appearance: Normal appearance.  HENT:     Right Ear: Ear canal normal. Tympanic membrane is injected.     Left Ear: Ear canal normal. Tympanic membrane is injected.  Mouth/Throat:     Mouth: Mucous membranes are moist.     Pharynx: Oropharynx is clear.  Cardiovascular:     Rate and Rhythm: Normal rate and regular rhythm.     Heart sounds: Normal heart sounds.  Pulmonary:     Effort: Pulmonary effort is normal.     Breath sounds: Normal breath sounds and air entry. No wheezing, rhonchi or rales.  Lymphadenopathy:     Cervical: No cervical adenopathy.  Neurological:     Mental Status: He is alert.      UC Treatments / Results  Labs (all labs ordered are listed, but only abnormal results are displayed) Labs Reviewed - No data to display  EKG   Radiology No results found.  Procedures Incision and Drainage  Date/Time: 07/01/2022 10:54 AM  Performed by: Nyoka Lint, PA-C Authorized by: Nyoka Lint, PA-C    (including critical care time)  Medications Ordered in UC Medications - No data to display  Initial Impression / Assessment and Plan / UC Course  I have reviewed the triage vital signs and the nursing notes.  Pertinent labs & imaging results that were available during my care of the patient were reviewed by me and considered in my medical decision making (see chart for details).    Plan: The diagnosis will be treated with the following: 1.  Upper respiratory infection: A.  Advised to continue Nasacort daily to control sinus and respiratory congestion. 2.  Sinusitis: A.  Zithromax 250 mg, 2 tablets today and then 1 tablet daily until completed to treat sinus infection. 3.  Advised follow-up PCP or return to urgent care as needed. Final Clinical Impressions(s) / UC Diagnoses   Final diagnoses:  Acute upper respiratory infection  Acute non-recurrent maxillary sinusitis     Discharge Instructions      Is to continue using  Nasacort as needed for sinus and upper respiratory congestion. Advised to use DayQuil and NyQuil as needed for cough and congestion. Advised take the Zithromax 250 mg, 2 tablets today and then 1 tablet daily until completed.  Advised follow-up PCP or return to urgent care as needed.    ED Prescriptions     Medication Sig Dispense Auth. Provider   azithromycin (ZITHROMAX Z-PAK) 250 MG tablet 2 tablets initially and then 1 tablet daily until completed. 6 each Nyoka Lint, PA-C      PDMP not reviewed this encounter.   Nyoka Lint, PA-C 07/01/22 1057

## 2022-07-01 NOTE — Discharge Instructions (Signed)
Is to continue using Nasacort as needed for sinus and upper respiratory congestion. Advised to use DayQuil and NyQuil as needed for cough and congestion. Advised take the Zithromax 250 mg, 2 tablets today and then 1 tablet daily until completed.  Advised follow-up PCP or return to urgent care as needed.

## 2022-07-01 NOTE — ED Triage Notes (Signed)
Pt states that he has a cough, chest congestion, and facial pain. X2 weeks

## 2022-07-02 MED ORDER — ALBUTEROL SULFATE HFA 108 (90 BASE) MCG/ACT IN AERS: 1.0000 | INHALATION_SPRAY | RESPIRATORY_TRACT | 0 refills | Status: DC | PRN

## 2022-07-21 DIAGNOSIS — M9901 Segmental and somatic dysfunction of cervical region: Secondary | ICD-10-CM | POA: Diagnosis not present

## 2022-07-21 DIAGNOSIS — M9903 Segmental and somatic dysfunction of lumbar region: Secondary | ICD-10-CM | POA: Diagnosis not present

## 2022-07-21 DIAGNOSIS — M9904 Segmental and somatic dysfunction of sacral region: Secondary | ICD-10-CM | POA: Diagnosis not present

## 2022-07-21 DIAGNOSIS — M5413 Radiculopathy, cervicothoracic region: Secondary | ICD-10-CM | POA: Diagnosis not present

## 2022-07-21 DIAGNOSIS — M461 Sacroiliitis, not elsewhere classified: Secondary | ICD-10-CM | POA: Diagnosis not present

## 2022-07-21 DIAGNOSIS — M5442 Lumbago with sciatica, left side: Secondary | ICD-10-CM | POA: Diagnosis not present

## 2022-07-21 DIAGNOSIS — M5451 Vertebrogenic low back pain: Secondary | ICD-10-CM | POA: Diagnosis not present

## 2022-07-22 ENCOUNTER — Ambulatory Visit: Payer: Medicare HMO | Admitting: Podiatry

## 2022-07-22 DIAGNOSIS — R972 Elevated prostate specific antigen [PSA]: Secondary | ICD-10-CM | POA: Diagnosis not present

## 2022-07-23 ENCOUNTER — Other Ambulatory Visit: Payer: Self-pay | Admitting: Family Medicine

## 2022-07-23 DIAGNOSIS — M9901 Segmental and somatic dysfunction of cervical region: Secondary | ICD-10-CM | POA: Diagnosis not present

## 2022-07-23 DIAGNOSIS — M5413 Radiculopathy, cervicothoracic region: Secondary | ICD-10-CM | POA: Diagnosis not present

## 2022-07-23 DIAGNOSIS — M461 Sacroiliitis, not elsewhere classified: Secondary | ICD-10-CM | POA: Diagnosis not present

## 2022-07-23 DIAGNOSIS — M5451 Vertebrogenic low back pain: Secondary | ICD-10-CM | POA: Diagnosis not present

## 2022-07-23 DIAGNOSIS — M5442 Lumbago with sciatica, left side: Secondary | ICD-10-CM | POA: Diagnosis not present

## 2022-07-23 DIAGNOSIS — M9904 Segmental and somatic dysfunction of sacral region: Secondary | ICD-10-CM | POA: Diagnosis not present

## 2022-07-23 DIAGNOSIS — J9801 Acute bronchospasm: Secondary | ICD-10-CM

## 2022-07-23 DIAGNOSIS — M9903 Segmental and somatic dysfunction of lumbar region: Secondary | ICD-10-CM | POA: Diagnosis not present

## 2022-07-28 DIAGNOSIS — G20A1 Parkinson's disease without dyskinesia, without mention of fluctuations: Secondary | ICD-10-CM | POA: Diagnosis not present

## 2022-07-28 DIAGNOSIS — M9904 Segmental and somatic dysfunction of sacral region: Secondary | ICD-10-CM | POA: Diagnosis not present

## 2022-07-28 DIAGNOSIS — M5442 Lumbago with sciatica, left side: Secondary | ICD-10-CM | POA: Diagnosis not present

## 2022-07-28 DIAGNOSIS — M461 Sacroiliitis, not elsewhere classified: Secondary | ICD-10-CM | POA: Diagnosis not present

## 2022-07-28 DIAGNOSIS — M9903 Segmental and somatic dysfunction of lumbar region: Secondary | ICD-10-CM | POA: Diagnosis not present

## 2022-07-28 DIAGNOSIS — M5413 Radiculopathy, cervicothoracic region: Secondary | ICD-10-CM | POA: Diagnosis not present

## 2022-07-28 DIAGNOSIS — M9901 Segmental and somatic dysfunction of cervical region: Secondary | ICD-10-CM | POA: Diagnosis not present

## 2022-07-28 DIAGNOSIS — M5451 Vertebrogenic low back pain: Secondary | ICD-10-CM | POA: Diagnosis not present

## 2022-07-29 DIAGNOSIS — R3915 Urgency of urination: Secondary | ICD-10-CM | POA: Diagnosis not present

## 2022-07-29 DIAGNOSIS — N401 Enlarged prostate with lower urinary tract symptoms: Secondary | ICD-10-CM | POA: Diagnosis not present

## 2022-07-29 DIAGNOSIS — R972 Elevated prostate specific antigen [PSA]: Secondary | ICD-10-CM | POA: Diagnosis not present

## 2022-07-29 DIAGNOSIS — N5201 Erectile dysfunction due to arterial insufficiency: Secondary | ICD-10-CM | POA: Diagnosis not present

## 2022-07-30 DIAGNOSIS — M9901 Segmental and somatic dysfunction of cervical region: Secondary | ICD-10-CM | POA: Diagnosis not present

## 2022-07-30 DIAGNOSIS — M5413 Radiculopathy, cervicothoracic region: Secondary | ICD-10-CM | POA: Diagnosis not present

## 2022-07-30 DIAGNOSIS — M5442 Lumbago with sciatica, left side: Secondary | ICD-10-CM | POA: Diagnosis not present

## 2022-07-30 DIAGNOSIS — M9903 Segmental and somatic dysfunction of lumbar region: Secondary | ICD-10-CM | POA: Diagnosis not present

## 2022-07-30 DIAGNOSIS — M5451 Vertebrogenic low back pain: Secondary | ICD-10-CM | POA: Diagnosis not present

## 2022-07-30 DIAGNOSIS — M461 Sacroiliitis, not elsewhere classified: Secondary | ICD-10-CM | POA: Diagnosis not present

## 2022-07-30 DIAGNOSIS — M9904 Segmental and somatic dysfunction of sacral region: Secondary | ICD-10-CM | POA: Diagnosis not present

## 2022-08-04 DIAGNOSIS — M9901 Segmental and somatic dysfunction of cervical region: Secondary | ICD-10-CM | POA: Diagnosis not present

## 2022-08-04 DIAGNOSIS — M5413 Radiculopathy, cervicothoracic region: Secondary | ICD-10-CM | POA: Diagnosis not present

## 2022-08-04 DIAGNOSIS — M5451 Vertebrogenic low back pain: Secondary | ICD-10-CM | POA: Diagnosis not present

## 2022-08-04 DIAGNOSIS — M461 Sacroiliitis, not elsewhere classified: Secondary | ICD-10-CM | POA: Diagnosis not present

## 2022-08-04 DIAGNOSIS — M5442 Lumbago with sciatica, left side: Secondary | ICD-10-CM | POA: Diagnosis not present

## 2022-08-04 DIAGNOSIS — M9903 Segmental and somatic dysfunction of lumbar region: Secondary | ICD-10-CM | POA: Diagnosis not present

## 2022-08-04 DIAGNOSIS — M9904 Segmental and somatic dysfunction of sacral region: Secondary | ICD-10-CM | POA: Diagnosis not present

## 2022-08-06 DIAGNOSIS — M5442 Lumbago with sciatica, left side: Secondary | ICD-10-CM | POA: Diagnosis not present

## 2022-08-06 DIAGNOSIS — M9903 Segmental and somatic dysfunction of lumbar region: Secondary | ICD-10-CM | POA: Diagnosis not present

## 2022-08-06 DIAGNOSIS — M461 Sacroiliitis, not elsewhere classified: Secondary | ICD-10-CM | POA: Diagnosis not present

## 2022-08-06 DIAGNOSIS — M5451 Vertebrogenic low back pain: Secondary | ICD-10-CM | POA: Diagnosis not present

## 2022-08-06 DIAGNOSIS — M9904 Segmental and somatic dysfunction of sacral region: Secondary | ICD-10-CM | POA: Diagnosis not present

## 2022-08-06 DIAGNOSIS — M5413 Radiculopathy, cervicothoracic region: Secondary | ICD-10-CM | POA: Diagnosis not present

## 2022-08-06 DIAGNOSIS — M9901 Segmental and somatic dysfunction of cervical region: Secondary | ICD-10-CM | POA: Diagnosis not present

## 2022-08-10 ENCOUNTER — Ambulatory Visit: Payer: Medicare HMO | Admitting: Podiatry

## 2022-08-10 ENCOUNTER — Encounter: Payer: Self-pay | Admitting: Podiatry

## 2022-08-10 DIAGNOSIS — M79674 Pain in right toe(s): Secondary | ICD-10-CM | POA: Diagnosis not present

## 2022-08-10 DIAGNOSIS — M79675 Pain in left toe(s): Secondary | ICD-10-CM | POA: Diagnosis not present

## 2022-08-10 DIAGNOSIS — B351 Tinea unguium: Secondary | ICD-10-CM | POA: Diagnosis not present

## 2022-08-10 NOTE — Progress Notes (Signed)
This patient presents to the office with chief complaint of long thick painful nails.  Patient says the nails are painful walking and wearing shoes.  This patient is unable to self treat.  This patient is unable to trim his  nails since he is unable to reach his nails.  he presents to the office for preventative foot care services. He presents to the office with his wife. Patient also relates callus in nail groove.  General Appearance  Alert, conversant and in no acute stress.  Vascular  Dorsalis pedis and posterior tibial  pulses are palpable  bilaterally.  Capillary return is within normal limits  bilaterally. Temperature is within normal limits  bilaterally.  Neurologic  Senn-Weinstein monofilament wire test within normal limits  bilaterally. Muscle power within normal limits bilaterally.  Nails Thick disfigured discolored nails with subungual debris  hallux nails bilaterally. No evidence of bacterial infection or drainage bilaterally.  Orthopedic  No limitations of motion  feet .  No crepitus or effusions noted.  HAV  B/L.  Hammer toes  B/L.  Skin  normotropic skin with no porokeratosis noted bilaterally.  No signs of infections or ulcers noted.     Onychomycosis  Nails  B/L.  Pain in right toes  Pain in left toes  Debridement of nails both feet followed trimming the nails with dremel tool.    RTC 3 months.   Arnie Clingenpeel DPM   

## 2022-08-11 DIAGNOSIS — M9901 Segmental and somatic dysfunction of cervical region: Secondary | ICD-10-CM | POA: Diagnosis not present

## 2022-08-11 DIAGNOSIS — M9904 Segmental and somatic dysfunction of sacral region: Secondary | ICD-10-CM | POA: Diagnosis not present

## 2022-08-11 DIAGNOSIS — M5413 Radiculopathy, cervicothoracic region: Secondary | ICD-10-CM | POA: Diagnosis not present

## 2022-08-11 DIAGNOSIS — M9903 Segmental and somatic dysfunction of lumbar region: Secondary | ICD-10-CM | POA: Diagnosis not present

## 2022-08-11 DIAGNOSIS — M5451 Vertebrogenic low back pain: Secondary | ICD-10-CM | POA: Diagnosis not present

## 2022-08-11 DIAGNOSIS — M461 Sacroiliitis, not elsewhere classified: Secondary | ICD-10-CM | POA: Diagnosis not present

## 2022-08-11 DIAGNOSIS — M5442 Lumbago with sciatica, left side: Secondary | ICD-10-CM | POA: Diagnosis not present

## 2022-08-13 DIAGNOSIS — M9903 Segmental and somatic dysfunction of lumbar region: Secondary | ICD-10-CM | POA: Diagnosis not present

## 2022-08-13 DIAGNOSIS — M9904 Segmental and somatic dysfunction of sacral region: Secondary | ICD-10-CM | POA: Diagnosis not present

## 2022-08-13 DIAGNOSIS — M5413 Radiculopathy, cervicothoracic region: Secondary | ICD-10-CM | POA: Diagnosis not present

## 2022-08-13 DIAGNOSIS — M5442 Lumbago with sciatica, left side: Secondary | ICD-10-CM | POA: Diagnosis not present

## 2022-08-13 DIAGNOSIS — M9901 Segmental and somatic dysfunction of cervical region: Secondary | ICD-10-CM | POA: Diagnosis not present

## 2022-08-13 DIAGNOSIS — M5451 Vertebrogenic low back pain: Secondary | ICD-10-CM | POA: Diagnosis not present

## 2022-08-13 DIAGNOSIS — M461 Sacroiliitis, not elsewhere classified: Secondary | ICD-10-CM | POA: Diagnosis not present

## 2022-08-17 DIAGNOSIS — M9901 Segmental and somatic dysfunction of cervical region: Secondary | ICD-10-CM | POA: Diagnosis not present

## 2022-08-17 DIAGNOSIS — M5442 Lumbago with sciatica, left side: Secondary | ICD-10-CM | POA: Diagnosis not present

## 2022-08-17 DIAGNOSIS — M5413 Radiculopathy, cervicothoracic region: Secondary | ICD-10-CM | POA: Diagnosis not present

## 2022-08-17 DIAGNOSIS — M9904 Segmental and somatic dysfunction of sacral region: Secondary | ICD-10-CM | POA: Diagnosis not present

## 2022-08-17 DIAGNOSIS — M9903 Segmental and somatic dysfunction of lumbar region: Secondary | ICD-10-CM | POA: Diagnosis not present

## 2022-08-17 DIAGNOSIS — M461 Sacroiliitis, not elsewhere classified: Secondary | ICD-10-CM | POA: Diagnosis not present

## 2022-08-17 DIAGNOSIS — M5451 Vertebrogenic low back pain: Secondary | ICD-10-CM | POA: Diagnosis not present

## 2022-08-20 DIAGNOSIS — M461 Sacroiliitis, not elsewhere classified: Secondary | ICD-10-CM | POA: Diagnosis not present

## 2022-08-20 DIAGNOSIS — M5413 Radiculopathy, cervicothoracic region: Secondary | ICD-10-CM | POA: Diagnosis not present

## 2022-08-20 DIAGNOSIS — M9901 Segmental and somatic dysfunction of cervical region: Secondary | ICD-10-CM | POA: Diagnosis not present

## 2022-08-20 DIAGNOSIS — M9903 Segmental and somatic dysfunction of lumbar region: Secondary | ICD-10-CM | POA: Diagnosis not present

## 2022-08-20 DIAGNOSIS — M9904 Segmental and somatic dysfunction of sacral region: Secondary | ICD-10-CM | POA: Diagnosis not present

## 2022-08-20 DIAGNOSIS — M5451 Vertebrogenic low back pain: Secondary | ICD-10-CM | POA: Diagnosis not present

## 2022-08-20 DIAGNOSIS — M5442 Lumbago with sciatica, left side: Secondary | ICD-10-CM | POA: Diagnosis not present

## 2022-08-24 DIAGNOSIS — M5442 Lumbago with sciatica, left side: Secondary | ICD-10-CM | POA: Diagnosis not present

## 2022-08-24 DIAGNOSIS — M5413 Radiculopathy, cervicothoracic region: Secondary | ICD-10-CM | POA: Diagnosis not present

## 2022-08-24 DIAGNOSIS — M9904 Segmental and somatic dysfunction of sacral region: Secondary | ICD-10-CM | POA: Diagnosis not present

## 2022-08-24 DIAGNOSIS — M9901 Segmental and somatic dysfunction of cervical region: Secondary | ICD-10-CM | POA: Diagnosis not present

## 2022-08-24 DIAGNOSIS — M461 Sacroiliitis, not elsewhere classified: Secondary | ICD-10-CM | POA: Diagnosis not present

## 2022-08-24 DIAGNOSIS — M9903 Segmental and somatic dysfunction of lumbar region: Secondary | ICD-10-CM | POA: Diagnosis not present

## 2022-08-24 DIAGNOSIS — M5451 Vertebrogenic low back pain: Secondary | ICD-10-CM | POA: Diagnosis not present

## 2022-08-25 DIAGNOSIS — Z08 Encounter for follow-up examination after completed treatment for malignant neoplasm: Secondary | ICD-10-CM | POA: Diagnosis not present

## 2022-08-25 DIAGNOSIS — Z85828 Personal history of other malignant neoplasm of skin: Secondary | ICD-10-CM | POA: Diagnosis not present

## 2022-08-27 DIAGNOSIS — M5413 Radiculopathy, cervicothoracic region: Secondary | ICD-10-CM | POA: Diagnosis not present

## 2022-08-27 DIAGNOSIS — M5442 Lumbago with sciatica, left side: Secondary | ICD-10-CM | POA: Diagnosis not present

## 2022-08-27 DIAGNOSIS — M5451 Vertebrogenic low back pain: Secondary | ICD-10-CM | POA: Diagnosis not present

## 2022-08-27 DIAGNOSIS — M9904 Segmental and somatic dysfunction of sacral region: Secondary | ICD-10-CM | POA: Diagnosis not present

## 2022-08-27 DIAGNOSIS — M9901 Segmental and somatic dysfunction of cervical region: Secondary | ICD-10-CM | POA: Diagnosis not present

## 2022-08-27 DIAGNOSIS — M461 Sacroiliitis, not elsewhere classified: Secondary | ICD-10-CM | POA: Diagnosis not present

## 2022-08-27 DIAGNOSIS — M9903 Segmental and somatic dysfunction of lumbar region: Secondary | ICD-10-CM | POA: Diagnosis not present

## 2022-08-31 DIAGNOSIS — M5442 Lumbago with sciatica, left side: Secondary | ICD-10-CM | POA: Diagnosis not present

## 2022-08-31 DIAGNOSIS — M5413 Radiculopathy, cervicothoracic region: Secondary | ICD-10-CM | POA: Diagnosis not present

## 2022-08-31 DIAGNOSIS — M461 Sacroiliitis, not elsewhere classified: Secondary | ICD-10-CM | POA: Diagnosis not present

## 2022-08-31 DIAGNOSIS — M9901 Segmental and somatic dysfunction of cervical region: Secondary | ICD-10-CM | POA: Diagnosis not present

## 2022-08-31 DIAGNOSIS — M9904 Segmental and somatic dysfunction of sacral region: Secondary | ICD-10-CM | POA: Diagnosis not present

## 2022-08-31 DIAGNOSIS — M5451 Vertebrogenic low back pain: Secondary | ICD-10-CM | POA: Diagnosis not present

## 2022-08-31 DIAGNOSIS — M9903 Segmental and somatic dysfunction of lumbar region: Secondary | ICD-10-CM | POA: Diagnosis not present

## 2022-09-03 DIAGNOSIS — M9904 Segmental and somatic dysfunction of sacral region: Secondary | ICD-10-CM | POA: Diagnosis not present

## 2022-09-03 DIAGNOSIS — M461 Sacroiliitis, not elsewhere classified: Secondary | ICD-10-CM | POA: Diagnosis not present

## 2022-09-03 DIAGNOSIS — M5413 Radiculopathy, cervicothoracic region: Secondary | ICD-10-CM | POA: Diagnosis not present

## 2022-09-03 DIAGNOSIS — M9903 Segmental and somatic dysfunction of lumbar region: Secondary | ICD-10-CM | POA: Diagnosis not present

## 2022-09-03 DIAGNOSIS — M5442 Lumbago with sciatica, left side: Secondary | ICD-10-CM | POA: Diagnosis not present

## 2022-09-03 DIAGNOSIS — M9901 Segmental and somatic dysfunction of cervical region: Secondary | ICD-10-CM | POA: Diagnosis not present

## 2022-09-03 DIAGNOSIS — M5451 Vertebrogenic low back pain: Secondary | ICD-10-CM | POA: Diagnosis not present

## 2022-09-17 DIAGNOSIS — M5451 Vertebrogenic low back pain: Secondary | ICD-10-CM | POA: Diagnosis not present

## 2022-09-17 DIAGNOSIS — M9901 Segmental and somatic dysfunction of cervical region: Secondary | ICD-10-CM | POA: Diagnosis not present

## 2022-09-17 DIAGNOSIS — M5442 Lumbago with sciatica, left side: Secondary | ICD-10-CM | POA: Diagnosis not present

## 2022-09-17 DIAGNOSIS — M461 Sacroiliitis, not elsewhere classified: Secondary | ICD-10-CM | POA: Diagnosis not present

## 2022-09-17 DIAGNOSIS — M5413 Radiculopathy, cervicothoracic region: Secondary | ICD-10-CM | POA: Diagnosis not present

## 2022-09-17 DIAGNOSIS — M9904 Segmental and somatic dysfunction of sacral region: Secondary | ICD-10-CM | POA: Diagnosis not present

## 2022-09-17 DIAGNOSIS — M9903 Segmental and somatic dysfunction of lumbar region: Secondary | ICD-10-CM | POA: Diagnosis not present

## 2022-10-13 DIAGNOSIS — M5413 Radiculopathy, cervicothoracic region: Secondary | ICD-10-CM | POA: Diagnosis not present

## 2022-10-13 DIAGNOSIS — M461 Sacroiliitis, not elsewhere classified: Secondary | ICD-10-CM | POA: Diagnosis not present

## 2022-10-13 DIAGNOSIS — M9903 Segmental and somatic dysfunction of lumbar region: Secondary | ICD-10-CM | POA: Diagnosis not present

## 2022-10-13 DIAGNOSIS — M9904 Segmental and somatic dysfunction of sacral region: Secondary | ICD-10-CM | POA: Diagnosis not present

## 2022-10-13 DIAGNOSIS — M5442 Lumbago with sciatica, left side: Secondary | ICD-10-CM | POA: Diagnosis not present

## 2022-10-13 DIAGNOSIS — M9901 Segmental and somatic dysfunction of cervical region: Secondary | ICD-10-CM | POA: Diagnosis not present

## 2022-10-13 DIAGNOSIS — M5451 Vertebrogenic low back pain: Secondary | ICD-10-CM | POA: Diagnosis not present

## 2022-10-27 DIAGNOSIS — R972 Elevated prostate specific antigen [PSA]: Secondary | ICD-10-CM | POA: Diagnosis not present

## 2022-11-10 DIAGNOSIS — M9903 Segmental and somatic dysfunction of lumbar region: Secondary | ICD-10-CM | POA: Diagnosis not present

## 2022-11-10 DIAGNOSIS — M9901 Segmental and somatic dysfunction of cervical region: Secondary | ICD-10-CM | POA: Diagnosis not present

## 2022-11-10 DIAGNOSIS — M5413 Radiculopathy, cervicothoracic region: Secondary | ICD-10-CM | POA: Diagnosis not present

## 2022-11-10 DIAGNOSIS — M9904 Segmental and somatic dysfunction of sacral region: Secondary | ICD-10-CM | POA: Diagnosis not present

## 2022-11-10 DIAGNOSIS — M5442 Lumbago with sciatica, left side: Secondary | ICD-10-CM | POA: Diagnosis not present

## 2022-11-10 DIAGNOSIS — M5451 Vertebrogenic low back pain: Secondary | ICD-10-CM | POA: Diagnosis not present

## 2022-11-10 DIAGNOSIS — M461 Sacroiliitis, not elsewhere classified: Secondary | ICD-10-CM | POA: Diagnosis not present

## 2022-11-23 ENCOUNTER — Telehealth: Payer: Self-pay

## 2022-11-25 NOTE — Telephone Encounter (Signed)
Reached out to patient to set up follow up visit with provider to discuss chronic conditions.  Telephone encounter attempt : 2   A HIPAA compliant voice message was left requesting a return call.  Instructed patient to call office or to call me at [phone].  Elijio Miles [facility]

## 2022-12-03 ENCOUNTER — Ambulatory Visit: Payer: Medicare HMO | Admitting: *Deleted

## 2022-12-03 DIAGNOSIS — Z0000 Encounter for general adult medical examination without abnormal findings: Secondary | ICD-10-CM | POA: Diagnosis not present

## 2022-12-03 NOTE — Progress Notes (Signed)
Subjective:   Alan Ball is a 72 y.o. male who presents for Medicare Annual/Subsequent preventive examination.  Visit Complete: Virtual  I connected with  SHAMIR CHACKO on 12/03/22 by a audio enabled telemedicine application and verified that I am speaking with the correct person using two identifiers.  Patient Location: Home  Provider Location: Home Office  I discussed the limitations of evaluation and management by telemedicine. The patient expressed understanding and agreed to proceed.  Vital Signs: Unable to obtain new vitals due to this being a telehealth visit.   Review of Systems     Cardiac Risk Factors include: advanced age (>40men, >63 women);male gender     Objective:    There were no vitals filed for this visit. There is no height or weight on file to calculate BMI.     12/03/2022   10:13 AM 11/26/2021    9:12 AM 08/23/2020   11:23 AM 04/04/2020   11:10 AM 03/13/2019    1:39 PM 02/13/2019    1:30 PM 08/25/2018   11:12 AM  Advanced Directives  Does Patient Have a Medical Advance Directive? Yes Yes Yes No;Yes Yes No No  Type of Estate agent of [employer] Power of [place];Living will Living will;Healthcare Power of [employer] Power of [employer] Power of Attorney    Does patient want to make changes to medical advance directive?   No - Patient declined  No - Patient declined    Copy of Healthcare Power of Attorney in Chart? No - copy requested No - copy requested   No - copy requested    Would patient like information on creating a medical advance directive?    No - Patient declined  Yes (Inpatient - patient requests chaplain consult to create a medical advance directive) No - Patient declined    Current Medications (verified) Outpatient Encounter Medications as of 12/03/2022  Medication Sig   amantadine (SYMMETREL) 100 MG capsule Take 100 mg by mouth 2 (two) times daily.   amLODipine (NORVASC) 5 MG tablet Take 1  tablet (5 mg total) by mouth daily.   amLODipine (NORVASC) 5 MG tablet Take 1 tablet by mouth daily.   atorvastatin (LIPITOR) 10 MG tablet Take 1 tablet (10 mg total) by mouth daily.   carbidopa-levodopa (SINEMET IR) 25-100 MG tablet Take by mouth.   fluticasone (CUTIVATE) 0.05 % cream Apply topically 2 (two) times daily as needed.   ibuprofen (ADVIL,MOTRIN) 200 MG tablet Take 200 mg by mouth every 6 (six) hours as needed for pain.   Multiple Vitamin (MULTIVITAMIN) tablet Take 1 tablet by mouth daily.   omeprazole (PRILOSEC) 20 MG capsule Take 1 capsule (20 mg total) by mouth daily.   sildenafil (VIAGRA) 50 MG tablet Take 1 tablet by mouth daily as needed.   VENTOLIN HFA 108 (90 Base) MCG/ACT inhaler INHALE 1 TO 2 PUFFS INTO THE LUNGS BY MOUTH  EVERY 4 HOURS AS NEEDED FOR WHEEZING OR SHORTNESS OF BREATH   vitamin C (ASCORBIC ACID) 500 MG tablet Take 500 mg by mouth daily.   amantadine (SYMMETREL) 100 MG capsule Take by mouth.   amoxicillin-clavulanate (AUGMENTIN) 875-125 MG tablet Take 1 tablet by mouth every 12 (twelve) hours. (Patient not taking: Reported on 12/03/2022)   azithromycin (ZITHROMAX Z-PAK) 250 MG tablet 2 tablets initially and then 1 tablet daily until completed.   Carbidopa-Levodopa ER (SINEMET CR) 25-100 MG tablet controlled release Take 1 tablet by mouth daily. Notes he does not take this  daily   No facility-administered encounter medications on file as of 12/03/2022.    Allergies (verified) Patient has no known allergies.   History: Past Medical History:  Diagnosis Date   Allergy    Asthma    Hyperlipemia    Hypertension    Melanoma (HCC) 02/2022   on left side of nose   Tremor    Past Surgical History:  Procedure Laterality Date   CARPAL TUNNEL RELEASE Bilateral 2019, 2020   CATARACT EXTRACTION BILATERAL W/ ANTERIOR VITRECTOMY Bilateral    cateracts     HERNIA REPAIR Bilateral    inguinal   SHOULDER SURGERY     Family History  Problem Relation Age of Onset    Hypertension Mother    Thyroid disease Mother    Cancer Father        brain tumor (gleoplastoma)   Diabetes Father    Colon polyps Father    Colon cancer Neg Hx    Stomach cancer Neg Hx    Rectal cancer Neg Hx    Esophageal cancer Neg Hx    Pancreatic cancer Neg Hx    Social History   Socioeconomic History   Marital status: Married    Spouse name: Carney Bern   Number of children: Not on file   Years of education: Not on file   Highest education level: Bachelor's degree (e.g., BA, AB, BS)  Occupational History    Comment: retired  Tobacco Use   Smoking status: Never    Passive exposure: Never   Smokeless tobacco: Never  Vaping Use   Vaping status: Never Used  Substance and Sexual Activity   Alcohol use: Yes    Comment: socially   Drug use: No   Sexual activity: Never  Other Topics Concern   Not on file  Social History Narrative   Lives with wife   Caffeine- coffee 3 cups daily, maybe a soda   Social Determinants of Health   Financial Resource Strain: Low Risk  (12/03/2022)   Overall Financial Resource Strain (CARDIA)    Difficulty of Paying Living Expenses: Not hard at all  Food Insecurity: No Food Insecurity (12/03/2022)   Hunger Vital Sign    Worried About Running Out of Food in the Last Year: Never true    Ran Out of Food in the Last Year: Never true  Transportation Needs: No Transportation Needs (12/03/2022)   PRAPARE - Administrator, Civil Service (Medical): No    Lack of Transportation (Non-Medical): No  Physical Activity: Sufficiently Active (11/26/2021)   Exercise Vital Sign    Days of Exercise per Week: 5 days    Minutes of Exercise per Session: 30 min  Stress: No Stress Concern Present (12/03/2022)   [employer] of Occupational Health - Occupational Stress Questionnaire    Feeling of Stress : Not at all  Social Connections: Moderately Integrated (12/03/2022)   Social Connection and Isolation Panel [NHANES]    Frequency of Communication with  Friends and Family: More than three times a week    Frequency of Social Gatherings with Friends and Family: Three times a week    Attends Religious Services: More than 4 times per year    Active Member of Clubs or Organizations: No    Attends Banker Meetings: Never    Marital Status: Married    Tobacco Counseling Counseling given: Not Answered   Clinical Intake:  Pre-visit preparation completed: Yes  Pain : No/denies pain     Diabetes: No  How often do you need to have someone help you when you read instructions, pamphlets, or other written materials from your doctor or pharmacy?: 1 - Never  Interpreter Needed?: No  Information entered by :: Remi Haggard LPN   Activities of Daily Living    12/03/2022   10:03 AM 01/16/2022   11:06 AM  In your present state of health, do you have any difficulty performing the following activities:  Hearing? 0 0  Vision? 0 0  Difficulty concentrating or making decisions? 0 0  Walking or climbing stairs? 0 0  Dressing or bathing? 0 0  Doing errands, shopping? 0 0  Preparing Food and eating ? N   Using the Toilet? N   In the past six months, have you accidently leaked urine? N   Do you have problems with loss of bowel control? N   Managing your Medications? N   Managing your Finances? N   Housekeeping or managing your Housekeeping? N     Patient Care Team: Shade Flood, MD as PCP - General (Family Medicine) Karie Soda, MD as Consulting Physician (General Surgery) Leane Call, PA-C (Neurology) Wynema Birch, MD as Referring Physician (Neurology) Napoleon Form, MD as Consulting Physician (Gastroenterology) Salvatore Marvel, MD as Consulting Physician (Orthopedic Surgery) Candelaria Stagers, DPM as Consulting Physician (Podiatry)  Indicate any recent Medical Services you may have received from other than [facility] providers in the past year (date may be approximate).     Assessment:   This is a routine  wellness examination for [place].  Hearing/Vision screen Hearing Screening - Comments:: No trouble hearing Vision Screening - Comments:: Up to date Cataracts surgery 2023 Unsure of names  Dietary issues and exercise activities discussed:     Goals Addressed             This Visit's Progress    Patient Stated       Continue current lifestyle       Depression Screen    12/03/2022   10:10 AM 01/16/2022   11:03 AM 12/26/2021   10:17 AM 11/26/2021    9:12 AM 11/26/2021    9:10 AM 06/06/2020    2:10 PM 04/04/2020   11:05 AM  PHQ 2/9 Scores  PHQ - 2 Score 0 0 0 0 0 0 0  PHQ- 9 Score 0 0 2        Fall Risk    12/03/2022   10:02 AM 01/16/2022   11:03 AM 12/26/2021   10:18 AM 11/26/2021    9:12 AM 07/16/2021    9:10 AM  Fall Risk   Falls in the past year? 0 0 0 0 0  Number falls in past yr: 0 0 0 0 0  Injury with Fall? 0 0 0 0 0  Risk for fall due to :  No Fall Risks No Fall Risks  No Fall Risks  Follow up Falls evaluation completed;Education provided;Falls prevention discussed Falls evaluation completed Falls evaluation completed Falls evaluation completed;Education provided Falls evaluation completed    MEDICARE RISK AT HOME:  Medicare Risk at Home - 12/03/22 1003     Any stairs in or around the home? No    If so, are there any without handrails? No    Home free of loose throw rugs in walkways, pet beds, electrical cords, etc? Yes    Adequate lighting in your home to reduce risk of falls? Yes    Life alert? No    Use of  a cane, walker or w/c? No    Grab bars in the bathroom? Yes    Shower chair or bench in shower? Yes    Elevated toilet seat or a handicapped toilet? Yes             TIMED UP AND GO:  Was the test performed?  No    Cognitive Function:        12/03/2022   10:07 AM 01/16/2022   11:04 AM 04/04/2020   11:03 AM 02/13/2019    1:30 PM 08/25/2018   11:15 AM  6CIT Screen  What Year? 0 points 4 points 0 points 0 points 0 points  What month? 0 points 0  points 0 points 0 points 0 points  What time? 0 points 0 points 0 points 0 points 0 points  Count back from 20 0 points 0 points 0 points 0 points 0 points  Months in reverse 2 points 0 points 0 points 2 points 0 points  Repeat phrase 0 points 0 points 0 points 2 points 0 points  Total Score 2 points 4 points 0 points 4 points 0 points    Immunizations Immunization History  Administered Date(s) Administered   Fluad Quad(high Dose 65+) 02/13/2019, 04/01/2020, 01/16/2022   Influenza, High Dose Seasonal PF 02/17/2018   Influenza,inj,Quad PF,6+ Mos 12/25/2013, 05/11/2016, 04/19/2017   Pneumococcal Conjugate-13 12/25/2013   Pneumococcal Polysaccharide-23 03/16/2014   Tdap 09/08/2012   Zoster Recombinant(Shingrix) 04/10/2019, 07/27/2019    TDAP status: Due, Education has been provided regarding the importance of this vaccine. Advised may receive this vaccine at local pharmacy or Health Dept. Aware to provide a copy of the vaccination record if obtained from local pharmacy or Health Dept. Verbalized acceptance and understanding.  Flu Vaccine status: Up to date  Pneumococcal vaccine status: Up to date  Covid-19 vaccine status: Information provided on how to obtain vaccines.   Qualifies for Shingles Vaccine? No   Zostavax completed Yes   Shingrix Completed?: Yes  Screening Tests Health Maintenance  Topic Date Due   DTaP/Tdap/Td (2 - Td or Tdap) 09/09/2022   INFLUENZA VACCINE  12/03/2022   COVID-19 Vaccine (1) 12/19/2022 (Originally 11/23/1955)   Medicare Annual Wellness (AWV)  12/03/2023   Colonoscopy  03/18/2025   Pneumonia Vaccine 38+ Years old  Completed   Hepatitis C Screening  Completed   Zoster Vaccines- Shingrix  Completed   HPV VACCINES  Aged Out    Health Maintenance  Health Maintenance Due  Topic Date Due   DTaP/Tdap/Td (2 - Td or Tdap) 09/09/2022   INFLUENZA VACCINE  12/03/2022    Colorectal cancer screening: Type of screening: Colonoscopy. Completed 2026.  Repeat every 3 years  Lung Cancer Screening: (Low Dose CT Chest recommended if Age 59-80 years, 20 pack-year currently smoking OR have quit w/in 15years.) does not qualify.   Lung Cancer Screening Referral:   Additional Screening:  Hepatitis C Screening: does not qualify; Completed 2018  Vision Screening: Recommended annual ophthalmology exams for early detection of glaucoma and other disorders of the eye. Is the patient up to date with their annual eye exam?  Yes  Who is the provider or what is the name of the office in which the patient attends annual eye exams? unsure If pt is not established with a provider, would they like to be referred to a provider to establish care? No .   Dental Screening: Recommended annual dental exams for proper oral hygiene    Community Resource Referral / Chronic Care  Management: CRR required this visit?  No   CCM required this visit?  No     Plan:     I have personally reviewed and noted the following in the patient's chart:   Medical and social history Use of alcohol, tobacco or illicit drugs  Current medications and supplements including opioid prescriptions. Patient is not currently taking opioid prescriptions. Functional ability and status Nutritional status Physical activity Advanced directives List of other physicians Hospitalizations, surgeries, and ER visits in previous 12 months Vitals Screenings to include cognitive, depression, and falls Referrals and appointments  In addition, I have reviewed and discussed with patient certain preventive protocols, quality metrics, and best practice recommendations. A written personalized care plan for preventive services as well as general preventive health recommendations were provided to patient.     Remi Haggard, LPN   05/09/1094   After Visit Summary: (MyChart) Due to this being a telephonic visit, the after visit summary with patients personalized plan was offered to patient via MyChart    Nurse Notes:

## 2022-12-03 NOTE — Patient Instructions (Signed)
Alan Ball , Thank you for taking time to come for your Medicare Wellness Visit. I appreciate your ongoing commitment to your health goals. Please review the following plan we discussed and let me know if I can assist you in the future.   Screening recommendations/referrals: Colonoscopy: up to date Recommended yearly ophthalmology/optometry visit for glaucoma screening and checkup Recommended yearly dental visit for hygiene and checkup  Vaccinations: Influenza vaccine: up to date Pneumococcal vaccine: up to date Tdap vaccine: education provided Shingles vaccine: up tod ate    Advanced directives: not on file   Preventive Care 65 Years and Older, Male Preventive care refers to lifestyle choices and visits with your health care provider that can promote health and wellness. What does preventive care include? A yearly physical exam. This is also called an annual well check. Dental exams once or twice a year. Routine eye exams. Ask your health care provider how often you should have your eyes checked. Personal lifestyle choices, including: Daily care of your teeth and gums. Regular physical activity. Eating a healthy diet. Avoiding tobacco and drug use. Limiting alcohol use. Practicing safe sex. Taking low doses of aspirin every day. Taking vitamin and mineral supplements as recommended by your health care provider. What happens during an annual well check? The services and screenings done by your health care provider during your annual well check will depend on your age, overall health, lifestyle risk factors, and family history of disease. Counseling  Your health care provider may ask you questions about your: Alcohol use. Tobacco use. Drug use. Emotional well-being. Home and relationship well-being. Sexual activity. Eating habits. History of falls. Memory and ability to understand (cognition). Work and work Astronomer. Screening  You may have the following tests or  measurements: Height, weight, and BMI. Blood pressure. Lipid and cholesterol levels. These may be checked every 5 years, or more frequently if you are over 74 years old. Skin check. Lung cancer screening. You may have this screening every year starting at age 32 if you have a 30-pack-year history of smoking and currently smoke or have quit within the past 15 years. Fecal occult blood test (FOBT) of the stool. You may have this test every year starting at age 25. Flexible sigmoidoscopy or colonoscopy. You may have a sigmoidoscopy every 5 years or a colonoscopy every 10 years starting at age 28. Prostate cancer screening. Recommendations will vary depending on your family history and other risks. Hepatitis C blood test. Hepatitis B blood test. Sexually transmitted disease (STD) testing. Diabetes screening. This is done by checking your blood sugar (glucose) after you have not eaten for a while (fasting). You may have this done every 1-3 years. Abdominal aortic aneurysm (AAA) screening. You may need this if you are a current or former smoker. Osteoporosis. You may be screened starting at age 68 if you are at high risk. Talk with your health care provider about your test results, treatment options, and if necessary, the need for more tests. Vaccines  Your health care provider may recommend certain vaccines, such as: Influenza vaccine. This is recommended every year. Tetanus, diphtheria, and acellular pertussis (Tdap, Td) vaccine. You may need a Td booster every 10 years. Zoster vaccine. You may need this after age 37. Pneumococcal 13-valent conjugate (PCV13) vaccine. One dose is recommended after age 79. Pneumococcal polysaccharide (PPSV23) vaccine. One dose is recommended after age 20. Talk to your health care provider about which screenings and vaccines you need and how often you need them. This  information is not intended to replace advice given to you by your health care provider. Make sure  you discuss any questions you have with your health care provider. Document Released: 05/17/2015 Document Revised: 01/08/2016 Document Reviewed: 02/19/2015 Elsevier Interactive Patient Education  2017 [employer].  Fall Prevention in the Home Falls can cause injuries. They can happen to people of all ages. There are many things you can do to make your home safe and to help prevent falls. What can I do on the outside of my home? Regularly fix the edges of walkways and driveways and fix any cracks. Remove anything that might make you trip as you walk through a door, such as a raised step or threshold. Trim any bushes or trees on the path to your home. Use bright outdoor lighting. Clear any walking paths of anything that might make someone trip, such as rocks or tools. Regularly check to see if handrails are loose or broken. Make sure that both sides of any steps have handrails. Any raised decks and porches should have guardrails on the edges. Have any leaves, snow, or ice cleared regularly. Use sand or salt on walking paths during winter. Clean up any spills in your garage right away. This includes oil or grease spills. What can I do in the bathroom? Use night lights. Install grab bars by the toilet and in the tub and shower. Do not use towel bars as grab bars. Use non-skid mats or decals in the tub or shower. If you need to sit down in the shower, use a plastic, non-slip stool. Keep the floor dry. Clean up any water that spills on the floor as soon as it happens. Remove soap buildup in the tub or shower regularly. Attach bath mats securely with double-sided non-slip rug tape. Do not have throw rugs and other things on the floor that can make you trip. What can I do in the bedroom? Use night lights. Make sure that you have a light by your bed that is easy to reach. Do not use any sheets or blankets that are too big for your bed. They should not hang down onto the floor. Have a firm  chair that has side arms. You can use this for support while you get dressed. Do not have throw rugs and other things on the floor that can make you trip. What can I do in the kitchen? Clean up any spills right away. Avoid walking on wet floors. Keep items that you use a lot in easy-to-reach places. If you need to reach something above you, use a strong step stool that has a grab bar. Keep electrical cords out of the way. Do not use floor polish or wax that makes floors slippery. If you must use wax, use non-skid floor wax. Do not have throw rugs and other things on the floor that can make you trip. What can I do with my stairs? Do not leave any items on the stairs. Make sure that there are handrails on both sides of the stairs and use them. Fix handrails that are broken or loose. Make sure that handrails are as long as the stairways. Check any carpeting to make sure that it is firmly attached to the stairs. Fix any carpet that is loose or worn. Avoid having throw rugs at the top or bottom of the stairs. If you do have throw rugs, attach them to the floor with carpet tape. Make sure that you have a light switch at the top  of the stairs and the bottom of the stairs. If you do not have them, ask someone to add them for you. What else can I do to help prevent falls? Wear shoes that: Do not have high heels. Have rubber bottoms. Are comfortable and fit you well. Are closed at the toe. Do not wear sandals. If you use a stepladder: Make sure that it is fully opened. Do not climb a closed stepladder. Make sure that both sides of the stepladder are locked into place. Ask someone to hold it for you, if possible. Clearly mark and make sure that you can see: Any grab bars or handrails. First and last steps. Where the edge of each step is. Use tools that help you move around (mobility aids) if they are needed. These include: Canes. Walkers. Scooters. Crutches. Turn on the lights when you go  into a dark area. Replace any light bulbs as soon as they burn out. Set up your furniture so you have a clear path. Avoid moving your furniture around. If any of your floors are uneven, fix them. If there are any pets around you, be aware of where they are. Review your medicines with your doctor. Some medicines can make you feel dizzy. This can increase your chance of falling. Ask your doctor what other things that you can do to help prevent falls. This information is not intended to replace advice given to you by your health care provider. Make sure you discuss any questions you have with your health care provider. Document Released: 02/14/2009 Document Revised: 09/26/2015 Document Reviewed: 05/25/2014 Elsevier Interactive Patient Education  2017 [employer].

## 2022-12-10 ENCOUNTER — Ambulatory Visit: Payer: Medicare HMO | Admitting: Podiatry

## 2022-12-17 ENCOUNTER — Encounter (INDEPENDENT_AMBULATORY_CARE_PROVIDER_SITE_OTHER): Payer: Self-pay

## 2022-12-21 ENCOUNTER — Ambulatory Visit: Payer: Medicare HMO | Admitting: Podiatry

## 2022-12-21 ENCOUNTER — Encounter: Payer: Self-pay | Admitting: Podiatry

## 2022-12-21 DIAGNOSIS — B351 Tinea unguium: Secondary | ICD-10-CM | POA: Diagnosis not present

## 2022-12-21 DIAGNOSIS — M79675 Pain in left toe(s): Secondary | ICD-10-CM | POA: Diagnosis not present

## 2022-12-21 DIAGNOSIS — M79674 Pain in right toe(s): Secondary | ICD-10-CM

## 2022-12-21 NOTE — Progress Notes (Signed)
This patient presents to the office with chief complaint of long thick painful nails.  Patient says the nails are painful walking and wearing shoes.  This patient is unable to self treat.  This patient is unable to trim his  nails since he is unable to reach his nails.  he presents to the office for preventative foot care services. He presents to the office with his wife. Patient also relates callus in nail groove.  General Appearance  Alert, conversant and in no acute stress.  Vascular  Dorsalis pedis and posterior tibial  pulses are palpable  bilaterally.  Capillary return is within normal limits  bilaterally. Temperature is within normal limits  bilaterally.  Neurologic  Senn-Weinstein monofilament wire test within normal limits  bilaterally. Muscle power within normal limits bilaterally.  Nails Thick disfigured discolored nails with subungual debris  hallux nails bilaterally. No evidence of bacterial infection or drainage bilaterally.  Orthopedic  No limitations of motion  feet .  No crepitus or effusions noted.  HAV  B/L.  Hammer toes  B/L.  Skin  normotropic skin with no porokeratosis noted bilaterally.  No signs of infections or ulcers noted.     Onychomycosis  Nails  B/L.  Pain in right toes  Pain in left toes  Debridement of nails both feet followed trimming the nails with dremel tool.    RTC 4  months.   Helane Gunther DPM

## 2023-01-13 ENCOUNTER — Encounter: Payer: Self-pay | Admitting: Family Medicine

## 2023-01-13 ENCOUNTER — Ambulatory Visit (INDEPENDENT_AMBULATORY_CARE_PROVIDER_SITE_OTHER): Payer: Medicare HMO | Admitting: Family Medicine

## 2023-01-13 VITALS — BP 142/74 | HR 69 | Temp 98.0°F | Ht 69.0 in | Wt 173.8 lb

## 2023-01-13 DIAGNOSIS — J9801 Acute bronchospasm: Secondary | ICD-10-CM

## 2023-01-13 DIAGNOSIS — K219 Gastro-esophageal reflux disease without esophagitis: Secondary | ICD-10-CM

## 2023-01-13 DIAGNOSIS — E785 Hyperlipidemia, unspecified: Secondary | ICD-10-CM | POA: Diagnosis not present

## 2023-01-13 DIAGNOSIS — Z23 Encounter for immunization: Secondary | ICD-10-CM | POA: Diagnosis not present

## 2023-01-13 DIAGNOSIS — I10 Essential (primary) hypertension: Secondary | ICD-10-CM | POA: Diagnosis not present

## 2023-01-13 DIAGNOSIS — R140 Abdominal distension (gaseous): Secondary | ICD-10-CM

## 2023-01-13 DIAGNOSIS — K429 Umbilical hernia without obstruction or gangrene: Secondary | ICD-10-CM

## 2023-01-13 MED ORDER — OMEPRAZOLE 20 MG PO CPDR: 20.0000 mg | DELAYED_RELEASE_CAPSULE | Freq: Every day | ORAL | 3 refills | Status: DC

## 2023-01-13 MED ORDER — ATORVASTATIN CALCIUM 10 MG PO TABS: 10.0000 mg | ORAL_TABLET | Freq: Every day | ORAL | 3 refills | Status: DC

## 2023-01-13 MED ORDER — ALBUTEROL SULFATE HFA 108 (90 BASE) MCG/ACT IN AERS: 1.0000 | INHALATION_SPRAY | RESPIRATORY_TRACT | 0 refills | Status: AC | PRN

## 2023-01-13 MED ORDER — AMLODIPINE BESYLATE 5 MG PO TABS: 5.0000 mg | ORAL_TABLET | Freq: Every day | ORAL | 3 refills | Status: DC

## 2023-01-13 NOTE — Patient Instructions (Addendum)
Flu vaccine given today.  I will order an ultrasound and labs but follow-up if bloating continues and no sign of bloating on the ultrasound.  Sometimes certain foods can contribute to bloating.  See information below. Blood pressure is borderline elevated today, monitor that at home and if persistently over 130/80 I would recommend increasing medication.  Follow-up in the next 6 months for recheck. You do have an umbilical hernia, I am happy to refer you to a general surgeon to discuss repair if needed, see information below, and as we discussed if any redness, pain, or inability to reduce or press that hernia back in you should be seen right away.  I do not expect this to happen.  Take care.   There is a recommendation for RSV vaccine for patients over age 69.  This can be given at your pharmacy, as well as tetanus vaccine.  Typically would recommend the RSV vaccine as most important for patients over age 39 that have comorbidities that put them at increased risk for severe disease (heart disease such as congestive heart failure, coronary artery disease, lung disease such as asthma or COPD, kidney disease, liver disease, diabetes, chronic or progressive neurologic or muscular conditions, immunosuppressed, or being frail or of advanced age).  For others that do not have these risk factors, there still is some benefit from vaccination since age is one of the main risk factors for developing severe disease however baseline risk of developing severe disease and requiring hospitalization is likely to be lower compared to those that have comorbidities in addition to age.   CDC does have some information as well: [employer]  Abdominal Bloating When you have abdominal bloating, your abdomen may feel full, tight, or painful. It may also look bigger than normal or swollen (distended). Common causes of abdominal bloating include: Swallowing  air. Constipation. Problems digesting food. Eating too much. Irritable bowel syndrome. This is a condition that affects the large intestine. Lactose intolerance. This is an inability to digest lactose, a natural sugar in dairy products. Celiac disease. This is a condition that affects the ability to digest gluten, a protein found in some grains. Gastroparesis. This is a condition that slows down the movement of food in the stomach and small intestine. It is more common in people with diabetes mellitus. Gastroesophageal reflux disease (GERD). This is a condition that makes stomach acid flow back into the esophagus. Urinary retention. This means that the body is holding onto urine, and the bladder cannot be emptied all the way. Follow these instructions at home: Eating and drinking Avoid eating too much. Try not to swallow air while talking or eating. Avoid eating while lying down. Avoid these foods and drinks: Foods that cause gas, such as broccoli, cabbage, cauliflower, and baked beans. Carbonated drinks. Hard candy. Chewing gum. Medicines Take over-the-counter and prescription medicines only as told by your health care provider. Take probiotic medicines. These medicines contain live bacteria or yeasts that can help digestion. Take coated peppermint oil capsules. General instructions Try to exercise regularly. Exercise may help to relieve bloating that is caused by gas and relieve constipation. Keep all follow-up visits. This is important. Contact a health care provider if: You have nausea and vomiting. You have diarrhea. You have abdominal pain. You have unusual weight loss or weight gain. You have severe pain, and medicines do not help. Get help right away if: You have chest pain. You have trouble breathing. You have shortness of breath. You have trouble urinating. You  have darker urine than normal. You have blood in your stools or have dark, tarry stools. These symptoms may  represent a serious problem that is an emergency. Do not wait to see if the symptoms will go away. Get medical help right away. Call your local emergency services (911 in the [place] Do not drive yourself to the hospital. Summary Abdominal bloating means that the abdomen is swollen. Common causes of abdominal bloating are swallowing air, constipation, and problems digesting food. Avoid eating too much and avoid swallowing air. Avoid foods that cause gas, carbonated drinks, hard candy, and chewing gum. This information is not intended to replace advice given to you by your health care provider. Make sure you discuss any questions you have with your health care provider. Document Revised: 11/21/2019 Document Reviewed: 11/21/2019 Elsevier Patient Education  2024 [employer]   Umbilical Hernia, Adult  A hernia is a lump of tissue that pushes through an opening in the muscles. An umbilical hernia happens in the belly, near the belly button. The hernia may contain tissues from the small or large intestine. It may also have fatty tissue that covers the intestines. Umbilical hernias in adults may get worse over time. They need to be treated with surgery. There are several types of umbilical hernias. They include: Indirect hernia. This occurs just above or below the belly button. It's the most common type of umbilical hernia in adults. Direct hernia. This type occurs in an opening that's formed by the belly button. Reducible hernia. This hernia comes and goes. You may see it only when you strain, cough, or lift something heavy. This type of hernia can be pushed back into the belly (reduced). Incarcerated hernia. This traps the hernia in the wall of the belly. This type of hernia can't be pushed back into the belly. It can cause a strangulated hernia. Strangulated hernia. This hernia cuts off blood flow to the tissues inside the hernia. The tissues can die if this happens. This type of hernia must be  treated right away. What are the causes? An umbilical hernia happens when tissue inside the belly pushes through an opening in the muscles of the belly. What increases the risk? You're more likely to get this hernia if: You strain while lifting or pushing heavy objects. You've had several pregnancies. You have a condition that puts pressure on your belly, and you've had it for a long time. These include: Obesity. A buildup of fluid inside your belly. Vomiting or coughing all the time. Trouble pooping (constipation). You've had surgery that weakened the muscles in the belly. What are the signs or symptoms? The main symptom of this condition is a bulge at the belly button or near it. The bulge does not cause pain. Other symptoms depend on the type of hernia you have. A reducible hernia may be seen only when you strain, cough, or lift something heavy. Other symptoms may include: Dull pain. A feeling of pressure. An incarcerated hernia may cause very bad pain. Also, you may: Vomit or feel like you may vomit. Not be able to pass gas. A strangulated hernia may cause: Pain that gets worse and worse. Vomiting, or feeling like you may vomit. Pain when you press on the hernia. Change of color on the skin over the hernia. The skin may become red or purple. Trouble pooping. Blood in the poop. How is this diagnosed? This condition may be diagnosed based on: Your symptoms and medical history. A physical exam. You may be asked  to cough or strain while standing. These actions will put pressure inside your belly. The pressure can force the hernia through the opening in your muscles. Your health care provider may try to push the hernia back into your belly (reduce). How is this treated? Surgery is the only treatment for an umbilical hernia. Surgery for a strangulated hernia must be done right away. If you have a small hernia that's not incarcerated, you may need to lose weight before the surgery  is done. Follow these instructions at home: Managing constipation You may need to take these actions to prevent trouble pooping. This will help to prevent straining. Drink enough fluid to keep your pee (urine) pale yellow. Take over-the-counter or prescription medicines. Eat foods that are high in fiber, such as beans, whole grains, and fresh fruits and vegetables. Limit foods that are high in fat and sugars, such as fried or sweet foods. General instructions Do not try to push the hernia back in. Lose weight, if told by your provider. Watch your hernia for any changes in color or size. Tell your provider if any changes occur. You may need to avoid activities that put pressure on your hernia. You may have to avoid lifting. Ask your provider how much you can safely lift. Take over-the-counter and prescription medicines only as told by your provider. Contact a health care provider if: Your hernia gets larger or feels hard. Your hernia becomes painful. You get a fever or chills. Get help right away if: You get very bad pain near the area of the hernia, and the pain comes on suddenly. You have pain and you vomit or feel like you may vomit. The skin over your hernia changes color. These symptoms may be an emergency. Get help right away. Call 911. Do not wait to see if the symptoms go away. Do not drive yourself to the hospital. This information is not intended to replace advice given to you by your health care provider. Make sure you discuss any questions you have with your health care provider. Document Revised: 08/11/2022 Document Reviewed: 08/11/2022 Elsevier Patient Education  2024 [employer].

## 2023-01-13 NOTE — Progress Notes (Signed)
Subjective:  Patient ID: Alan Ball, male    DOB: 1951-04-01  Age: 72 y.o. MRN: [id number]  CC:  Chief Complaint  Patient presents with   Medication Refill    HPI Alan Ball presents for chronic medication follow-up.  He is followed by neurology at [facility] with Parkinson's.  Hypertension: Amlodipine 5 mg daily. No missed doses. No side effects. No new leg swelling, CP/HA/dyspnea.  Home readings: 125-132/78-80. BP Readings from Last 3 Encounters:  01/13/23 (!) 142/74  07/01/22 131/78  06/14/22 (!) 149/78   Lab Results  Component Value Date   CREATININE 0.94 01/16/2022   Hyperlipidemia: Lipitor 10mg  every day. No new myalgias/side effects.  Lab Results  Component Value Date   CHOL 161 01/16/2022   HDL 54.10 01/16/2022   LDLCALC 91 01/16/2022   TRIG 78.0 01/16/2022   CHOLHDL 3 01/16/2022   Lab Results  Component Value Date   ALT 5 01/16/2022   AST 21 01/16/2022   ALKPHOS 62 01/16/2022   BILITOT 0.9 01/16/2022    GERD Treated with omeprazole - managing symptoms, not having breakthrough sx's. No abdominal pain. Some soreness in upper stomach with leg exercises. Stomach appears bloated at end of day - past few years, no n/v. Normal bowel movements. EGD and colonoscopy in 03/2022.  Umbilicus outward in past few years. No pain, no redness or skin changes.   Mild intermittent asthma Rare need for albuterol in past - once every few months with dust or in winter. Doing well.   HM:  Declines covid vaccine, tdap - recommended at his pharmacy.  Flu vaccine today.   Immunization History  Administered Date(s) Administered   Fluad Quad(high Dose 65+) 02/13/2019, 04/01/2020, 01/16/2022   Influenza, High Dose Seasonal PF 02/17/2018   Influenza,inj,Quad PF,6+ Mos 12/25/2013, 05/11/2016, 04/19/2017   Pneumococcal Conjugate-13 12/25/2013   Pneumococcal Polysaccharide-23 03/16/2014   Tdap 09/08/2012   Zoster Recombinant(Shingrix) 04/10/2019,  07/27/2019     History Patient Active Problem List   Diagnosis Date Noted   Mass of soft tissue of left buttock 07/29/2020   Umbilical hernia 07/29/2020   Gastritis 07/29/2020   Parkinsonism 10/11/2019   Cervical radiculopathy 02/21/2019   DDD (degenerative disc disease), cervical 02/21/2019   Bilateral carpal tunnel syndrome 05/19/2018   Trigger middle finger of right hand 12/20/2017   De Quervain's tenosynovitis, right 12/20/2017   Carpal tunnel syndrome of right wrist 12/20/2017   Renal cyst, right 09/05/2015   Inguinal hernia 10/04/2012   Past Medical History:  Diagnosis Date   Allergy    Asthma    Hyperlipemia    Hypertension    Melanoma (HCC) 02/2022   on left side of nose   Tremor    Past Surgical History:  Procedure Laterality Date   CARPAL TUNNEL RELEASE Bilateral 2019, 2020   CATARACT EXTRACTION BILATERAL W/ ANTERIOR VITRECTOMY Bilateral    cateracts     HERNIA REPAIR Bilateral    inguinal   SHOULDER SURGERY     No Known Allergies Prior to Admission medications   Medication Sig Start Date End Date Taking? Authorizing Provider  amantadine (SYMMETREL) 100 MG capsule Take 100 mg by mouth 2 (two) times daily. 01/27/22  Yes [provider]  amLODipine (NORVASC) 5 MG tablet Take 1 tablet (5 mg total) by mouth daily. 01/16/22  Yes Alan Flood, MD  atorvastatin (LIPITOR) 10 MG tablet Take 1 tablet (10 mg total) by mouth daily. 01/16/22  Yes Alan Flood, MD  carbidopa-levodopa (SINEMET IR) 25-100 MG tablet Take by mouth. 01/31/19  Yes [provider]  fluticasone (CUTIVATE) 0.05 % cream Apply topically 2 (two) times daily as needed. 02/13/22  Yes [provider]  ibuprofen (ADVIL,MOTRIN) 200 MG tablet Take 200 mg by mouth every 6 (six) hours as needed for pain.   Yes [provider]  Multiple Vitamin (MULTIVITAMIN) tablet Take 1 tablet by mouth daily.   Yes [provider]  omeprazole (PRILOSEC) 20 MG capsule Take  1 capsule (20 mg total) by mouth daily. 01/16/22  Yes Alan Flood, MD  sildenafil (VIAGRA) 50 MG tablet Take 1 tablet by mouth daily as needed.   Yes [provider]  VENTOLIN HFA 108 (90 Base) MCG/ACT inhaler INHALE 1 TO 2 PUFFS INTO THE LUNGS BY MOUTH  EVERY 4 HOURS AS NEEDED FOR WHEEZING OR SHORTNESS OF BREATH 07/23/22  Yes Alan Flood, MD  vitamin C (ASCORBIC ACID) 500 MG tablet Take 500 mg by mouth daily.   Yes [provider]  amantadine (SYMMETREL) 100 MG capsule Take by mouth. 01/27/22   [provider]  amLODipine (NORVASC) 5 MG tablet Take 1 tablet by mouth daily. 02/13/19   [provider]  amoxicillin-clavulanate (AUGMENTIN) 875-125 MG tablet Take 1 tablet by mouth every 12 (twelve) hours. Patient not taking: Reported on 12/03/2022 06/14/22   Alan Bryant, FNP  azithromycin (ZITHROMAX Z-PAK) 250 MG tablet 2 tablets initially and then 1 tablet daily until completed. Patient not taking: Reported on 01/13/2023 07/01/22   Alan Lennox, PA-C   Social History   Socioeconomic History   Marital status: Married    Spouse name: Alan Ball   Number of children: Not on file   Years of education: Not on file   Highest education level: Bachelor's degree (e.g., BA, AB, BS)  Occupational History    Comment: retired  Tobacco Use   Smoking status: Never    Passive exposure: Never   Smokeless tobacco: Never  Vaping Use   Vaping status: Never Used  Substance and Sexual Activity   Alcohol use: Yes    Comment: socially   Drug use: No   Sexual activity: Never  Other Topics Concern   Not on file  Social History Narrative   Lives with wife   Caffeine- coffee 3 cups daily, maybe a soda   Social Determinants of Health   Financial Resource Strain: Low Risk  (12/03/2022)   Overall Financial Resource Strain (CARDIA)    Difficulty of Paying Living Expenses: Not hard at all  Food Insecurity: No Food Insecurity (12/03/2022)   Hunger Vital Sign    Worried About  Running Out of Food in the Last Year: Never true    Ran Out of Food in the Last Year: Never true  Transportation Needs: No Transportation Needs (12/03/2022)   PRAPARE - Administrator, Civil Service (Medical): No    Lack of Transportation (Non-Medical): No  Physical Activity: Sufficiently Active (11/26/2021)   Exercise Vital Sign    Days of Exercise per Week: 5 days    Minutes of Exercise per Session: 30 min  Stress: No Stress Concern Present (12/03/2022)   [employer] of Occupational Health - Occupational Stress Questionnaire    Feeling of Stress : Not at all  Social Connections: Moderately Integrated (12/03/2022)   Social Connection and Isolation Panel [NHANES]    Frequency of Communication with Friends and Family: More than three times a week    Frequency of Social  Gatherings with Friends and Family: Three times a week    Attends Religious Services: More than 4 times per year    Active Member of Clubs or Organizations: No    Attends Banker Meetings: Never    Marital Status: Married  Catering manager Violence: Not At Risk (12/03/2022)   Humiliation, Afraid, Rape, and Kick questionnaire    Fear of Current or Ex-Partner: No    Emotionally Abused: No    Physically Abused: No    Sexually Abused: No    Review of Systems Per HPI.   Objective:   Vitals:   01/13/23 1059  BP: (!) 142/74  Pulse: 69  Temp: 98 F (36.7 C)  TempSrc: Temporal  SpO2: 100%  Weight: 173 lb 12.8 oz (78.8 kg)  Height: 5\' 9"  (1.753 m)     Physical Exam Vitals reviewed.  Constitutional:      Appearance: He is well-developed.  HENT:     Head: Normocephalic and atraumatic.  Neck:     Vascular: No carotid bruit or JVD.  Cardiovascular:     Rate and Rhythm: Normal rate and regular rhythm.     Heart sounds: Normal heart sounds. No murmur heard. Pulmonary:     Effort: Pulmonary effort is normal.     Breath sounds: Normal breath sounds. No rales.  Abdominal:     General:  Abdomen is flat. There is no distension.     Hernia: A hernia (umbilical, easily reducible. nontender.) is present.  Musculoskeletal:     Right lower leg: No edema.     Left lower leg: No edema.  Skin:    General: Skin is warm and dry.  Neurological:     Mental Status: He is alert and oriented to person, place, and time.  Psychiatric:        Mood and Affect: Mood normal.      Over 44 minutes spent during visit, including chart review, counseling and assimilation of information, exam, discussion of plan including for bloating, hernia, and chart completion.   Assessment & Plan:  Alan Ball is a 72 y.o. male . Essential hypertension - Plan: amLODipine (NORVASC) 5 MG tablet, Comprehensive metabolic panel  - borderline control, home monitoring, option of higher dose amlodipine if remains elevated.   Hyperlipidemia, unspecified hyperlipidemia type - Plan: atorvastatin (LIPITOR) 10 MG tablet, Comprehensive metabolic panel, Lipid panel  -  Stable, tolerating current regimen. Medications refilled. Labs pending as above.   Gastroesophageal reflux disease, unspecified whether esophagitis present - Plan: omeprazole (PRILOSEC) 20 MG capsule  - stable, continue PPI.   Bronchospasm - Plan: albuterol (VENTOLIN HFA) 108 (90 Base) MCG/ACT inhaler  - rare need for albuterol. Continue same with RTC precautions.   Abdominal bloating - Plan: US Abdomen Complete  - intermittent symptoms - check ultrasound, labs above, handout given. Consider GI eval, dietary adjustments depending on results.   Umbilical hernia without obstruction and without gangrene  - hernia precautions given, including s/sx of incarcerated or strangulated hernia. Option to meet with general surgery discussed.   Need for influenza vaccination - Plan: Flu Vaccine Trivalent High Dose (Fluad)   Meds ordered this encounter  Medications   amLODipine (NORVASC) 5 MG tablet    Sig: Take 1 tablet (5 mg total) by mouth daily.     Dispense:  90 tablet    Refill:  3   atorvastatin (LIPITOR) 10 MG tablet    Sig: Take 1 tablet (10 mg total) by mouth daily.  Dispense:  90 tablet    Refill:  3   omeprazole (PRILOSEC) 20 MG capsule    Sig: Take 1 capsule (20 mg total) by mouth daily.    Dispense:  90 capsule    Refill:  3   albuterol (VENTOLIN HFA) 108 (90 Base) MCG/ACT inhaler    Sig: Inhale 1-2 puffs into the lungs every 4 (four) hours as needed for wheezing or shortness of breath.    Dispense:  18 g    Refill:  0   Patient Instructions  Flu vaccine given today.  I will order an ultrasound and labs but follow-up if bloating continues and no sign of bloating on the ultrasound.  Sometimes certain foods can contribute to bloating.  See information below. Blood pressure is borderline elevated today, monitor that at home and if persistently over 130/80 I would recommend increasing medication.  Follow-up in the next 6 months for recheck. You do have an umbilical hernia, I am happy to refer you to a general surgeon to discuss repair if needed, see information below, and as we discussed if any redness, pain, or inability to reduce or press that hernia back in you should be seen right away.  I do not expect this to happen.  Take care.   There is a recommendation for RSV vaccine for patients over age 74.  This can be given at your pharmacy, as well as tetanus vaccine.  Typically would recommend the RSV vaccine as most important for patients over age 70 that have comorbidities that put them at increased risk for severe disease (heart disease such as congestive heart failure, coronary artery disease, lung disease such as asthma or COPD, kidney disease, liver disease, diabetes, chronic or progressive neurologic or muscular conditions, immunosuppressed, or being frail or of advanced age).  For others that do not have these risk factors, there still is some benefit from vaccination since age is one of the main risk factors for developing  severe disease however baseline risk of developing severe disease and requiring hospitalization is likely to be lower compared to those that have comorbidities in addition to age.   CDC does have some information as well: [employer]  Abdominal Bloating When you have abdominal bloating, your abdomen may feel full, tight, or painful. It may also look bigger than normal or swollen (distended). Common causes of abdominal bloating include: Swallowing air. Constipation. Problems digesting food. Eating too much. Irritable bowel syndrome. This is a condition that affects the large intestine. Lactose intolerance. This is an inability to digest lactose, a natural sugar in dairy products. Celiac disease. This is a condition that affects the ability to digest gluten, a protein found in some grains. Gastroparesis. This is a condition that slows down the movement of food in the stomach and small intestine. It is more common in people with diabetes mellitus. Gastroesophageal reflux disease (GERD). This is a condition that makes stomach acid flow back into the esophagus. Urinary retention. This means that the body is holding onto urine, and the bladder cannot be emptied all the way. Follow these instructions at home: Eating and drinking Avoid eating too much. Try not to swallow air while talking or eating. Avoid eating while lying down. Avoid these foods and drinks: Foods that cause gas, such as broccoli, cabbage, cauliflower, and baked beans. Carbonated drinks. Hard candy. Chewing gum. Medicines Take over-the-counter and prescription medicines only as told by your health care provider. Take probiotic medicines. These medicines contain live bacteria or yeasts that  can help digestion. Take coated peppermint oil capsules. General instructions Try to exercise regularly. Exercise may help to relieve bloating that is caused by gas and relieve  constipation. Keep all follow-up visits. This is important. Contact a health care provider if: You have nausea and vomiting. You have diarrhea. You have abdominal pain. You have unusual weight loss or weight gain. You have severe pain, and medicines do not help. Get help right away if: You have chest pain. You have trouble breathing. You have shortness of breath. You have trouble urinating. You have darker urine than normal. You have blood in your stools or have dark, tarry stools. These symptoms may represent a serious problem that is an emergency. Do not wait to see if the symptoms will go away. Get medical help right away. Call your local emergency services (911 in the [place] Do not drive yourself to the hospital. Summary Abdominal bloating means that the abdomen is swollen. Common causes of abdominal bloating are swallowing air, constipation, and problems digesting food. Avoid eating too much and avoid swallowing air. Avoid foods that cause gas, carbonated drinks, hard candy, and chewing gum. This information is not intended to replace advice given to you by your health care provider. Make sure you discuss any questions you have with your health care provider. Document Revised: 11/21/2019 Document Reviewed: 11/21/2019 Elsevier Patient Education  2024 Elsevier Inc.   Umbilical Hernia, Adult  A hernia is a lump of tissue that pushes through an opening in the muscles. An umbilical hernia happens in the belly, near the belly button. The hernia may contain tissues from the small or large intestine. It may also have fatty tissue that covers the intestines. Umbilical hernias in adults may get worse over time. They need to be treated with surgery. There are several types of umbilical hernias. They include: Indirect hernia. This occurs just above or below the belly button. It's the most common type of umbilical hernia in adults. Direct hernia. This type occurs in an opening that's formed  by the belly button. Reducible hernia. This hernia comes and goes. You may see it only when you strain, cough, or lift something heavy. This type of hernia can be pushed back into the belly (reduced). Incarcerated hernia. This traps the hernia in the wall of the belly. This type of hernia can't be pushed back into the belly. It can cause a strangulated hernia. Strangulated hernia. This hernia cuts off blood flow to the tissues inside the hernia. The tissues can die if this happens. This type of hernia must be treated right away. What are the causes? An umbilical hernia happens when tissue inside the belly pushes through an opening in the muscles of the belly. What increases the risk? You're more likely to get this hernia if: You strain while lifting or pushing heavy objects. You've had several pregnancies. You have a condition that puts pressure on your belly, and you've had it for a long time. These include: Obesity. A buildup of fluid inside your belly. Vomiting or coughing all the time. Trouble pooping (constipation). You've had surgery that weakened the muscles in the belly. What are the signs or symptoms? The main symptom of this condition is a bulge at the belly button or near it. The bulge does not cause pain. Other symptoms depend on the type of hernia you have. A reducible hernia may be seen only when you strain, cough, or lift something heavy. Other symptoms may include: Dull pain. A feeling of pressure. An  incarcerated hernia may cause very bad pain. Also, you may: Vomit or feel like you may vomit. Not be able to pass gas. A strangulated hernia may cause: Pain that gets worse and worse. Vomiting, or feeling like you may vomit. Pain when you press on the hernia. Change of color on the skin over the hernia. The skin may become red or purple. Trouble pooping. Blood in the poop. How is this diagnosed? This condition may be diagnosed based on: Your symptoms and medical  history. A physical exam. You may be asked to cough or strain while standing. These actions will put pressure inside your belly. The pressure can force the hernia through the opening in your muscles. Your health care provider may try to push the hernia back into your belly (reduce). How is this treated? Surgery is the only treatment for an umbilical hernia. Surgery for a strangulated hernia must be done right away. If you have a small hernia that's not incarcerated, you may need to lose weight before the surgery is done. Follow these instructions at home: Managing constipation You may need to take these actions to prevent trouble pooping. This will help to prevent straining. Drink enough fluid to keep your pee (urine) pale yellow. Take over-the-counter or prescription medicines. Eat foods that are high in fiber, such as beans, whole grains, and fresh fruits and vegetables. Limit foods that are high in fat and sugars, such as fried or sweet foods. General instructions Do not try to push the hernia back in. Lose weight, if told by your provider. Watch your hernia for any changes in color or size. Tell your provider if any changes occur. You may need to avoid activities that put pressure on your hernia. You may have to avoid lifting. Ask your provider how much you can safely lift. Take over-the-counter and prescription medicines only as told by your provider. Contact a health care provider if: Your hernia gets larger or feels hard. Your hernia becomes painful. You get a fever or chills. Get help right away if: You get very bad pain near the area of the hernia, and the pain comes on suddenly. You have pain and you vomit or feel like you may vomit. The skin over your hernia changes color. These symptoms may be an emergency. Get help right away. Call 911. Do not wait to see if the symptoms go away. Do not drive yourself to the hospital. This information is not intended to replace advice  given to you by your health care provider. Make sure you discuss any questions you have with your health care provider. Document Revised: 08/11/2022 Document Reviewed: 08/11/2022 Elsevier Patient Education  2024 Elsevier Inc.     Signed,   Meredith Staggers, MD [facility] 01/13/23 11:07 AM

## 2023-01-14 LAB — COMPREHENSIVE METABOLIC PANEL
ALT: 4 U/L (ref 0–53)
AST: 19 U/L (ref 0–37)
Albumin: 4.2 g/dL (ref 3.5–5.2)
Alkaline Phosphatase: 75 U/L (ref 39–117)
BUN: 19 mg/dL (ref 6–23)
CO2: 27 meq/L (ref 19–32)
Calcium: 9.6 mg/dL (ref 8.4–10.5)
Chloride: 102 meq/L (ref 96–112)
Creatinine, Ser: 0.97 mg/dL (ref 0.40–1.50)
GFR: 77.1 mL/min (ref >60.00)
Glucose, Bld: 68 mg/dL — ABNORMAL LOW (ref 70–99)
Potassium: 4.6 meq/L (ref 3.5–5.1)
Sodium: 138 meq/L (ref 135–145)
Total Bilirubin: 0.9 mg/dL (ref 0.2–1.2)
Total Protein: 6.9 g/dL (ref 6.0–8.3)

## 2023-01-14 LAB — LIPID PANEL
Cholesterol: 143 mg/dL (ref 0–200)
HDL: 60.1 mg/dL (ref >39.00)
LDL Cholesterol: 70 mg/dL (ref 0–99)
NonHDL: 83.01
Total CHOL/HDL Ratio: 2
Triglycerides: 63 mg/dL (ref 0.0–149.0)
VLDL: 12.6 mg/dL (ref 0.0–40.0)

## 2023-01-15 ENCOUNTER — Telehealth: Payer: Self-pay

## 2023-01-15 ENCOUNTER — Ambulatory Visit
Admission: EM | Admit: 2023-01-15 | Discharge: 2023-01-15 | Disposition: A | Discharge status: Home / Self Care | Payer: Medicare HMO | Attending: Physician Assistant | Admitting: Physician Assistant

## 2023-01-15 DIAGNOSIS — H01001 Unspecified blepharitis right upper eyelid: Secondary | ICD-10-CM | POA: Diagnosis not present

## 2023-01-15 MED ORDER — ERYTHROMYCIN 5 MG/GM OP OINT: TOPICAL_OINTMENT | OPHTHALMIC | 0 refills | Status: DC

## 2023-01-15 NOTE — ED Triage Notes (Signed)
"  I had my PE at my PCP office on Wednesday and Thursday woke up with Right eye swelling, redness". Some discharge "white sticky stuff". No fever. No injury known.

## 2023-01-15 NOTE — Telephone Encounter (Signed)
Pharmacy calling to confirm Rx written today: Erythromycin "and as one time dose and treatment".   Discussed with Provider Billee Cashing PA-C)  B. Roten CMA

## 2023-01-15 NOTE — ED Provider Notes (Signed)
[name]    CSN: [id number] Arrival date & time: 01/15/23  1013      History   Chief Complaint Chief Complaint  Patient presents with   Eye Problem    HPI Alan Ball is a 72 y.o. male.   Patient here today for evaluation of swelling to his right upper eyelid that started yesterday.  He states he did have some drainage from it.  He has not any fever.  He denies any pain to the eye other than mild tenderness to the eyelid itself.  He has not had any injury.  The history is provided by the patient.  Eye Problem Associated symptoms: discharge   Associated symptoms: no numbness and no redness     Past Medical History:  Diagnosis Date   Allergy    Asthma    Hyperlipemia    Hypertension    Melanoma (HCC) 02/2022   on left side of nose   Tremor     Patient Active Problem List   Diagnosis Date Noted   Mass of soft tissue of left buttock 07/29/2020   Umbilical hernia 07/29/2020   Gastritis 07/29/2020   Parkinsonism 10/11/2019   Cervical radiculopathy 02/21/2019   DDD (degenerative disc disease), cervical 02/21/2019   Bilateral carpal tunnel syndrome 05/19/2018   Trigger middle finger of right hand 12/20/2017   De Quervain's tenosynovitis, right 12/20/2017   Carpal tunnel syndrome of right wrist 12/20/2017   Renal cyst, right 09/05/2015   Inguinal hernia 10/04/2012    Past Surgical History:  Procedure Laterality Date   CARPAL TUNNEL RELEASE Bilateral 2019, 2020   CATARACT EXTRACTION BILATERAL W/ ANTERIOR VITRECTOMY Bilateral    cateracts     HERNIA REPAIR Bilateral    inguinal   SHOULDER SURGERY         Home Medications    Prior to Admission medications   Medication Sig Start Date End Date Taking? Authorizing Provider  amantadine (SYMMETREL) 100 MG capsule Take 100 mg by mouth 2 (two) times daily. 01/27/22  Yes [provider]  amLODipine (NORVASC) 5 MG tablet Take 1 tablet (5 mg total) by mouth daily. 01/13/23  Yes Shade Flood, MD  atorvastatin (LIPITOR) 10 MG tablet Take 1 tablet (10 mg total) by mouth daily. 01/13/23  Yes Shade Flood, MD  carbidopa-levodopa (SINEMET IR) 25-100 MG tablet Take 1 tablet by mouth 3 (three) times daily. 01/31/19  Yes [provider]  erythromycin ophthalmic ointment Place a 1/2 inch ribbon of ointment into the lower eyelid. 01/15/23  Yes Tomi Bamberger, PA-C  fluticasone (CUTIVATE) 0.05 % cream Apply topically 2 (two) times daily as needed. 02/13/22  Yes [provider]  Multiple Vitamin (MULTIVITAMIN) tablet Take 1 tablet by mouth daily.   Yes [provider]  omeprazole (PRILOSEC) 20 MG capsule Take 1 capsule (20 mg total) by mouth daily. 01/13/23  Yes Shade Flood, MD  vitamin C (ASCORBIC ACID) 500 MG tablet Take 500 mg by mouth daily.   Yes [provider]  albuterol (VENTOLIN HFA) 108 (90 Base) MCG/ACT inhaler Inhale 1-2 puffs into the lungs every 4 (four) hours as needed for wheezing or shortness of breath. 01/13/23   Shade Flood, MD  ibuprofen (ADVIL,MOTRIN) 200 MG tablet Take 200 mg by mouth every 6 (six) hours as needed for pain.    [provider]  sildenafil (VIAGRA) 50 MG tablet Take 1 tablet by mouth daily as needed.    [provider]    Family History Family History  Problem Relation Age of Onset   Hypertension Mother    Thyroid disease Mother    Cancer Father        brain tumor (gleoplastoma)   Diabetes Father    Colon polyps Father    Colon cancer Neg Hx    Stomach cancer Neg Hx    Rectal cancer Neg Hx    Esophageal cancer Neg Hx    Pancreatic cancer Neg Hx     Social History Social History   Tobacco Use   Smoking status: Never    Passive exposure: Never   Smokeless tobacco: Never  Vaping Use   Vaping status: Never Used  Substance Use Topics   Alcohol use: Yes    Comment: socially, seldom.   Drug use: Never     Allergies   Patient has no known allergies.   Review of  Systems Review of Systems  Constitutional:  Negative for chills and fever.  HENT:  Negative for congestion.   Eyes:  Positive for discharge. Negative for pain, redness and visual disturbance.  Respiratory:  Negative for shortness of breath.   Neurological:  Negative for numbness.     Physical Exam Triage Vital Signs ED Triage Vitals  Encounter Vitals Group     BP 01/15/23 1036 130/71     Systolic BP Percentile --      Diastolic BP Percentile --      Pulse Rate 01/15/23 1036 88     Resp 01/15/23 1036 18     Temp 01/15/23 1036 98.4 F (36.9 C)     Temp Source 01/15/23 1036 Oral     SpO2 01/15/23 1036 97 %     Weight 01/15/23 1032 173 lb (78.5 kg)     Height 01/15/23 1032 5\' 8"  (1.727 m)     Head Circumference --      Peak Flow --      Pain Score 01/15/23 1030 0     Pain Loc --      Pain Education --      Exclude from Growth Chart --    No data found.  Updated Vital Signs BP 130/71 (BP Location: Left Arm)   Pulse 88   Temp 98.4 F (36.9 C) (Oral)   Resp 18   Ht 5\' 8"  (1.727 m)   Wt 173 lb (78.5 kg)   SpO2 97%   BMI 26.30 kg/m   Visual Acuity Right Eye Distance: 20/25 (Uncorrected) Left Eye Distance: 20/25 (Uncorrected) Bilateral Distance:      Physical Exam Vitals and nursing note reviewed.  Constitutional:      General: He is not in acute distress.    Appearance: Normal appearance. He is not ill-appearing.  HENT:     Head: Normocephalic and atraumatic.     Nose: Nose normal. No congestion or rhinorrhea.  Eyes:     Extraocular Movements: Extraocular movements intact.     Conjunctiva/sclera: Conjunctivae normal.     Pupils: Pupils are equal, round, and reactive to light.     Comments: Right upper eyelid with mild swelling and erythema.  No active drainage appreciated.  Cardiovascular:     Rate and Rhythm: Normal rate.  Pulmonary:     Effort: Pulmonary effort is normal.  Neurological:     Mental Status: He is alert.  Psychiatric:        Mood and  Affect: Mood normal.        Behavior: Behavior normal.  Thought Content: Thought content normal.      UC Treatments / Results  Labs (all labs ordered are listed, but only abnormal results are displayed) Labs Reviewed - No data to display  EKG   Radiology No results found.  Procedures Procedures (including critical care time)  Medications Ordered in UC Medications - No data to display  Initial Impression / Assessment and Plan / UC Course  I have reviewed the triage vital signs and the nursing notes.  Pertinent labs & imaging results that were available during my care of the patient were reviewed by me and considered in my medical decision making (see chart for details).    Suspect likely blepharitis and will treat with warm compresses and antibiotic ointment.  Recommended follow-up with ophthalmologist if no improvement over the weekend.  Encouraged sooner follow-up with any further concerns.  Final Clinical Impressions(s) / UC Diagnoses   Final diagnoses:  Blepharitis of right upper eyelid, unspecified type   Discharge Instructions   None    ED Prescriptions     Medication Sig Dispense Auth. Provider   erythromycin ophthalmic ointment Place a 1/2 inch ribbon of ointment into the lower eyelid. 3.5 g Tomi Bamberger, PA-C      PDMP not reviewed this encounter.   Tomi Bamberger, PA-C 01/15/23 1407

## 2023-01-20 ENCOUNTER — Ambulatory Visit
Admission: RE | Admit: 2023-01-20 | Discharge: 2023-01-20 | Disposition: A | Discharge status: Home / Self Care | Payer: Medicare HMO | Source: Ambulatory Visit | Attending: Family Medicine | Admitting: Family Medicine

## 2023-01-20 DIAGNOSIS — R140 Abdominal distension (gaseous): Secondary | ICD-10-CM

## 2023-01-26 ENCOUNTER — Telehealth: Payer: Self-pay

## 2023-01-26 ENCOUNTER — Encounter: Payer: Self-pay | Admitting: Family Medicine

## 2023-01-26 NOTE — Telephone Encounter (Signed)
-----   Message from Shade Flood sent at 01/26/2023  8:09 AM EDT ----- Noted. If still having bloating, please have him follow up to discuss next step.

## 2023-01-27 NOTE — Telephone Encounter (Signed)
See message regarding results on September 18.  Were there are other questions?  It appears that those results have been discussed with patient.  Let me know if I can help further.

## 2023-02-28 ENCOUNTER — Ambulatory Visit
Admission: EM | Admit: 2023-02-28 | Discharge: 2023-02-28 | Disposition: A | Discharge status: Home / Self Care | Payer: Medicare HMO

## 2023-02-28 DIAGNOSIS — J40 Bronchitis, not specified as acute or chronic: Secondary | ICD-10-CM | POA: Diagnosis not present

## 2023-02-28 DIAGNOSIS — J329 Chronic sinusitis, unspecified: Secondary | ICD-10-CM

## 2023-02-28 MED ORDER — AZITHROMYCIN 250 MG PO TABS: 250.0000 mg | ORAL_TABLET | Freq: Every day | ORAL | 0 refills | Status: DC

## 2023-02-28 MED ORDER — AMOXICILLIN 500 MG PO CAPS: 500.0000 mg | ORAL_CAPSULE | Freq: Three times a day (TID) | ORAL | 0 refills | Status: DC

## 2023-02-28 NOTE — ED Triage Notes (Signed)
"  Starting about 2 wks ago with sinus congestion, pressure and cough at times with cough increasing at night". No fever.

## 2023-02-28 NOTE — ED Provider Notes (Signed)
[name]    CSN: [id number] Arrival date & time: 02/28/23  1248      History   Chief Complaint Chief Complaint  Patient presents with   Sinus Problem    HPI Alan Ball is a 72 y.o. male.   Patient here today for evaluation of sinus congestion and pressure that started 2 weeks ago.  He notes he does have cough at times as well which is productive on occasion.  He has not had any fever.  He reports sore throat initially but this is resolved.  He has tried OTC treatment without significant improvement.  He also has albuterol inhaler for prior reactive airway issues that occur regularly but states this has not been significantly helpful.  The history is provided by the patient.  Sinus Problem Pertinent negatives include no abdominal pain and no shortness of breath.    Past Medical History:  Diagnosis Date   Allergy    Asthma    Hyperlipemia    Hypertension    Melanoma (HCC) 02/2022   on left side of nose   Tremor     Patient Active Problem List   Diagnosis Date Noted   Mass of soft tissue of left buttock 07/29/2020   Umbilical hernia 07/29/2020   Gastritis 07/29/2020   Parkinsonism (HCC) 10/11/2019   Cervical radiculopathy 02/21/2019   DDD (degenerative disc disease), cervical 02/21/2019   Bilateral carpal tunnel syndrome 05/19/2018   Trigger middle finger of right hand 12/20/2017   De Quervain's tenosynovitis, right 12/20/2017   Carpal tunnel syndrome of right wrist 12/20/2017   Renal cyst, right 09/05/2015   Inguinal hernia 10/04/2012    Past Surgical History:  Procedure Laterality Date   CARPAL TUNNEL RELEASE Bilateral 2019, 2020   CATARACT EXTRACTION BILATERAL W/ ANTERIOR VITRECTOMY Bilateral    cateracts     HERNIA REPAIR Bilateral    inguinal   SHOULDER SURGERY         Home Medications    Prior to Admission medications   Medication Sig Start Date End Date Taking? Authorizing Provider  albuterol (VENTOLIN HFA) 108 (90 Base)  MCG/ACT inhaler Inhale 1-2 puffs into the lungs every 4 (four) hours as needed for wheezing or shortness of breath. 01/13/23  Yes Shade Flood, MD  amantadine (SYMMETREL) 100 MG capsule Take 100 mg by mouth 2 (two) times daily. 01/27/22  Yes [provider]  amLODipine (NORVASC) 5 MG tablet Take 1 tablet (5 mg total) by mouth daily. 01/13/23  Yes Shade Flood, MD  amoxicillin (AMOXIL) 500 MG capsule Take 1 capsule (500 mg total) by mouth 3 (three) times daily. 02/28/23  Yes Tomi Bamberger, PA-C  atorvastatin (LIPITOR) 10 MG tablet Take 1 tablet (10 mg total) by mouth daily. 01/13/23  Yes Shade Flood, MD  azithromycin (ZITHROMAX) 250 MG tablet Take 1 tablet (250 mg total) by mouth daily. Take first 2 tablets together, then 1 every day until finished. 02/28/23  Yes Tomi Bamberger, PA-C  Multiple Vitamin (MULTIVITAMIN) tablet Take 1 tablet by mouth daily.   Yes [provider]  omeprazole (PRILOSEC) 20 MG capsule Take 1 capsule (20 mg total) by mouth daily. 01/13/23  Yes Shade Flood, MD  trihexyphenidyl (ARTANE) 2 MG tablet Take 4 mg by mouth 2 (two) times daily.   Yes [provider]  vitamin C (ASCORBIC ACID) 500 MG tablet Take 500 mg by mouth daily.   Yes [provider]  carbidopa-levodopa (SINEMET IR) 25-100  MG tablet Take 1 tablet by mouth 3 (three) times daily. 01/31/19   [provider]  erythromycin ophthalmic ointment Place a 1/2 inch ribbon of ointment into the lower eyelid. 01/15/23   Tomi Bamberger, PA-C  fluticasone (CUTIVATE) 0.05 % cream Apply topically 2 (two) times daily as needed. 02/13/22   [provider]  ibuprofen (ADVIL,MOTRIN) 200 MG tablet Take 200 mg by mouth every 6 (six) hours as needed for pain.    [provider]  sildenafil (VIAGRA) 50 MG tablet Take 1 tablet by mouth daily as needed.    [provider]    Family History Family History  Problem Relation Age of Onset    Hypertension Mother    Thyroid disease Mother    Cancer Father        brain tumor (gleoplastoma)   Diabetes Father    Colon polyps Father    Colon cancer Neg Hx    Stomach cancer Neg Hx    Rectal cancer Neg Hx    Esophageal cancer Neg Hx    Pancreatic cancer Neg Hx     Social History Social History   Tobacco Use   Smoking status: Never    Passive exposure: Never   Smokeless tobacco: Never  Vaping Use   Vaping status: Never Used  Substance Use Topics   Alcohol use: Yes    Comment: socially, seldom.   Drug use: Never     Allergies   Patient has no known allergies.   Review of Systems Review of Systems  Constitutional:  Negative for chills and fever.  HENT:  Positive for congestion. Negative for ear pain and sore throat.   Eyes:  Negative for discharge and redness.  Respiratory:  Positive for cough. Negative for shortness of breath.   Gastrointestinal:  Negative for abdominal pain, diarrhea, nausea and vomiting.     Physical Exam Triage Vital Signs ED Triage Vitals  Encounter Vitals Group     BP 02/28/23 1330 131/72     Systolic BP Percentile --      Diastolic BP Percentile --      Pulse Rate 02/28/23 1330 68     Resp 02/28/23 1330 18     Temp 02/28/23 1330 (!) 97.5 F (36.4 C)     Temp Source 02/28/23 1330 Oral     SpO2 02/28/23 1330 96 %     Weight 02/28/23 1327 170 lb (77.1 kg)     Height 02/28/23 1327 5\' 8"  (1.727 m)     Head Circumference --      Peak Flow --      Pain Score 02/28/23 1325 0     Pain Loc --      Pain Education --      Exclude from Growth Chart --    No data found.  Updated Vital Signs BP 131/72 (BP Location: Left Arm)   Pulse 68   Temp (!) 97.5 F (36.4 C) (Oral)   Resp 18   Ht 5\' 8"  (1.727 m)   Wt 170 lb (77.1 kg)   SpO2 96%   BMI 25.85 kg/m      Physical Exam Vitals and nursing note reviewed.  Constitutional:      General: He is not in acute distress.    Appearance: Normal appearance. He is not ill-appearing.   HENT:     Head: Normocephalic and atraumatic.     Nose: Congestion present.     Mouth/Throat:     Mouth: Mucous  membranes are moist.     Pharynx: Oropharynx is clear. No oropharyngeal exudate or posterior oropharyngeal erythema.  Eyes:     Conjunctiva/sclera: Conjunctivae normal.  Cardiovascular:     Rate and Rhythm: Normal rate and regular rhythm.     Heart sounds: Normal heart sounds. No murmur heard. Pulmonary:     Effort: Pulmonary effort is normal. No respiratory distress.     Breath sounds: Wheezing and rhonchi present. No rales.     Comments: Rare scattered wheezes and rhonchi Skin:    General: Skin is warm and dry.  Neurological:     Mental Status: He is alert.  Psychiatric:        Mood and Affect: Mood normal.        Thought Content: Thought content normal.      UC Treatments / Results  Labs (all labs ordered are listed, but only abnormal results are displayed) Labs Reviewed - No data to display  EKG   Radiology No results found.  Procedures Procedures (including critical care time)  Medications Ordered in UC Medications - No data to display  Initial Impression / Assessment and Plan / UC Course  I have reviewed the triage vital signs and the nursing notes.  Pertinent labs & imaging results that were available during my care of the patient were reviewed by me and considered in my medical decision making (see chart for details).   Z-Pak and amoxicillin prescribed for treatment of sinobronchitis- PNA coverages.  Recommended follow-up if no gradual improvement with any further concerns.   Final Clinical Impressions(s) / UC Diagnoses   Final diagnoses:  Sinobronchitis   Discharge Instructions   None    ED Prescriptions     Medication Sig Dispense Auth. Provider   azithromycin (ZITHROMAX) 250 MG tablet Take 1 tablet (250 mg total) by mouth daily. Take first 2 tablets together, then 1 every day until finished. 6 tablet Erma Pinto F, PA-C    amoxicillin (AMOXIL) 500 MG capsule Take 1 capsule (500 mg total) by mouth 3 (three) times daily. 21 capsule Tomi Bamberger, PA-C      PDMP not reviewed this encounter.   Tomi Bamberger, PA-C 02/28/23 1521

## 2023-04-11 DIAGNOSIS — Z008 Encounter for other general examination: Secondary | ICD-10-CM | POA: Diagnosis not present

## 2023-04-22 ENCOUNTER — Encounter: Payer: Self-pay | Admitting: Podiatry

## 2023-04-22 ENCOUNTER — Ambulatory Visit: Payer: Medicare HMO | Admitting: Podiatry

## 2023-04-22 DIAGNOSIS — M79675 Pain in left toe(s): Secondary | ICD-10-CM

## 2023-04-22 DIAGNOSIS — B351 Tinea unguium: Secondary | ICD-10-CM

## 2023-04-22 DIAGNOSIS — M79674 Pain in right toe(s): Secondary | ICD-10-CM | POA: Diagnosis not present

## 2023-04-22 NOTE — Progress Notes (Signed)
This patient presents to the office with chief complaint of long thick painful nails.  Patient says the nails are painful walking and wearing shoes.  This patient is unable to self treat.  This patient is unable to trim his  nails since he is unable to reach his nails.  he presents to the office for preventative foot care services. He presents to the office with his wife. Patient also relates callus in nail groove.  General Appearance  Alert, conversant and in no acute stress.  Vascular  Dorsalis pedis and posterior tibial  pulses are palpable  bilaterally.  Capillary return is within normal limits  bilaterally. Temperature is within normal limits  bilaterally.  Neurologic  Senn-Weinstein monofilament wire test within normal limits  bilaterally. Muscle power within normal limits bilaterally.  Nails Thick disfigured discolored nails with subungual debris  hallux nails bilaterally. No evidence of bacterial infection or drainage bilaterally.  Orthopedic  No limitations of motion  feet .  No crepitus or effusions noted.  HAV  B/L.  Hammer toes  B/L.  Skin  normotropic skin with no porokeratosis noted bilaterally.  No signs of infections or ulcers noted.     Onychomycosis  Nails  B/L.  Pain in right toes  Pain in left toes  Debridement of nails both feet followed trimming the nails with dremel tool.    RTC 4  months.   Helane Gunther DPM

## 2023-04-30 DIAGNOSIS — C4441 Basal cell carcinoma of skin of scalp and neck: Secondary | ICD-10-CM | POA: Diagnosis not present

## 2023-05-03 ENCOUNTER — Ambulatory Visit
Admission: EM | Admit: 2023-05-03 | Discharge: 2023-05-03 | Disposition: A | Discharge status: Home / Self Care | Payer: Medicare HMO

## 2023-05-03 DIAGNOSIS — J069 Acute upper respiratory infection, unspecified: Secondary | ICD-10-CM

## 2023-05-03 NOTE — Discharge Instructions (Addendum)
Most likely you have a viral illness: no antibiotic is indicated at this time, May treat with OTC meds of choice. Make sure to drink plenty of fluids to stay hydrated(gatorade, water, popsicles,jello,etc), avoid caffeine products. Follow up with PCP. Return as needed.

## 2023-05-03 NOTE — ED Triage Notes (Signed)
"  Cold symptoms started on Friday after being exposed to sick grandchildren and others". "Now I am having Cough, Congestion, Body aches, Fever (unknown degree)".

## 2023-05-03 NOTE — ED Provider Notes (Signed)
[name]    CSN: [id number] Arrival date & time: 05/03/23  1021      History   Chief Complaint Chief Complaint  Patient presents with   Cough   Fever   Generalized Body Aches    HPI Alan Ball is a 72 y.o. male.   72 year old male pt, Alan Ball, presents to urgent care for evaluation of cough,congestion, body aches, fever x 3 days.  Patient reports his grandchildren are sick and had the sniffles when visiting for holidays.  The history is provided by the patient. No language interpreter was used.    Past Medical History:  Diagnosis Date   Allergy    Asthma    Hyperlipemia    Hypertension    Melanoma (HCC) 02/2022   on left side of nose   Tremor     Patient Active Problem List   Diagnosis Date Noted   Viral URI with cough 05/03/2023   Mass of soft tissue of left buttock 07/29/2020   Umbilical hernia 07/29/2020   Gastritis 07/29/2020   Parkinsonism (HCC) 10/11/2019   Cervical radiculopathy 02/21/2019   DDD (degenerative disc disease), cervical 02/21/2019   Bilateral carpal tunnel syndrome 05/19/2018   Trigger middle finger of right hand 12/20/2017   De Quervain's tenosynovitis, right 12/20/2017   Carpal tunnel syndrome of right wrist 12/20/2017   Renal cyst, right 09/05/2015   Inguinal hernia 10/04/2012    Past Surgical History:  Procedure Laterality Date   CARPAL TUNNEL RELEASE Bilateral 2019, 2020   CATARACT EXTRACTION BILATERAL W/ ANTERIOR VITRECTOMY Bilateral    cateracts     HERNIA REPAIR Bilateral    inguinal   SHOULDER SURGERY         Home Medications    Prior to Admission medications   Medication Sig Start Date End Date Taking? Authorizing Provider  albuterol (VENTOLIN HFA) 108 (90 Base) MCG/ACT inhaler Inhale 1-2 puffs into the lungs every 4 (four) hours as needed for wheezing or shortness of breath. 01/13/23  Yes Shade Flood, MD  amLODipine (NORVASC) 5 MG tablet Take 1 tablet (5 mg total) by mouth daily.  01/13/23  Yes Shade Flood, MD  atorvastatin (LIPITOR) 10 MG tablet Take 1 tablet (10 mg total) by mouth daily. 01/13/23  Yes Shade Flood, MD  amantadine (SYMMETREL) 100 MG capsule Take 100 mg by mouth 2 (two) times daily. 01/27/22   [provider]  amoxicillin (AMOXIL) 500 MG capsule Take 1 capsule (500 mg total) by mouth 3 (three) times daily. 02/28/23   Tomi Bamberger, PA-C  azithromycin (ZITHROMAX) 250 MG tablet Take 1 tablet (250 mg total) by mouth daily. Take first 2 tablets together, then 1 every day until finished. 02/28/23   Tomi Bamberger, PA-C  carbidopa-levodopa (SINEMET IR) 25-100 MG tablet Take 1 tablet by mouth 3 (three) times daily. 01/31/19   [provider]  erythromycin ophthalmic ointment Place a 1/2 inch ribbon of ointment into the lower eyelid. 01/15/23   Tomi Bamberger, PA-C  fluticasone (CUTIVATE) 0.05 % cream Apply topically 2 (two) times daily as needed. 02/13/22   [provider]  ibuprofen (ADVIL,MOTRIN) 200 MG tablet Take 200 mg by mouth every 6 (six) hours as needed for pain.    [provider]  Multiple Vitamin (MULTIVITAMIN) tablet Take 1 tablet by mouth daily.    [provider]  omeprazole (PRILOSEC) 20 MG capsule Take 1 capsule (20 mg total) by mouth daily. 01/13/23  Shade Flood, MD  sildenafil (VIAGRA) 50 MG tablet Take 1 tablet by mouth daily as needed.    [provider]  trihexyphenidyl (ARTANE) 2 MG tablet Take 4 mg by mouth 2 (two) times daily.    [provider]  vitamin C (ASCORBIC ACID) 500 MG tablet Take 500 mg by mouth daily.    [provider]    Family History Family History  Problem Relation Age of Onset   Hypertension Mother    Thyroid disease Mother    Cancer Father        brain tumor (gleoplastoma)   Diabetes Father    Colon polyps Father    Colon cancer Neg Hx    Stomach cancer Neg Hx    Rectal cancer Neg Hx    Esophageal cancer Neg Hx     Pancreatic cancer Neg Hx     Social History Social History   Tobacco Use   Smoking status: Never    Passive exposure: Never   Smokeless tobacco: Never  Vaping Use   Vaping status: Never Used  Substance Use Topics   Alcohol use: Yes    Comment: socially, seldom.   Drug use: Never     Allergies   Patient has no known allergies.   Review of Systems Review of Systems  Constitutional:  Positive for fever.  HENT:  Positive for congestion.   Respiratory:  Positive for cough.   Musculoskeletal:  Positive for myalgias.  All other systems reviewed and are negative.    Physical Exam Triage Vital Signs ED Triage Vitals  Encounter Vitals Group     BP 05/03/23 1039 (!) 157/83     Systolic BP Percentile --      Diastolic BP Percentile --      Pulse Rate 05/03/23 1039 78     Resp 05/03/23 1039 (!) 22     Temp 05/03/23 1039 98.3 F (36.8 C)     Temp Source 05/03/23 1039 Oral     SpO2 05/03/23 1039 94 %     Weight 05/03/23 1036 169 lb 15.6 oz (77.1 kg)     Height 05/03/23 1036 5\' 8"  (1.727 m)     Head Circumference --      Peak Flow --      Pain Score 05/03/23 1035 5     Pain Loc --      Pain Education --      Exclude from Growth Chart --    No data found.  Updated Vital Signs BP (!) 157/83 (BP Location: Right Arm) Comment: "Never that high anymore, you can recheck later during visit".  Pulse 78   Temp 98.3 F (36.8 C) (Oral)   Resp (!) 22   Ht 5\' 8"  (1.727 m)   Wt 169 lb 15.6 oz (77.1 kg)   SpO2 94%   BMI 25.84 kg/m   Visual Acuity Right Eye Distance:   Left Eye Distance:   Bilateral Distance:    Right Eye Near:   Left Eye Near:    Bilateral Near:     Physical Exam Vitals and nursing note reviewed.  Constitutional:      General: He is not in acute distress.    Appearance: He is well-developed. He is not ill-appearing or toxic-appearing.  HENT:     Head: Normocephalic.     Right Ear: Tympanic membrane is retracted.     Left Ear: Tympanic membrane is  retracted.     Nose: Mucosal edema and congestion  present.     Mouth/Throat:     Mouth: Mucous membranes are moist.     Pharynx: Uvula midline.  Eyes:     General: Lids are normal.     Conjunctiva/sclera: Conjunctivae normal.     Pupils: Pupils are equal, round, and reactive to light.  Cardiovascular:     Rate and Rhythm: Normal rate and regular rhythm.     Heart sounds: Normal heart sounds.  Pulmonary:     Effort: Pulmonary effort is normal. No respiratory distress.     Breath sounds: Normal breath sounds and air entry. No decreased breath sounds or wheezing.  Abdominal:     General: There is no distension.     Palpations: Abdomen is soft.  Musculoskeletal:        General: Normal range of motion.     Cervical back: Normal range of motion.  Skin:    General: Skin is warm and dry.     Findings: No rash.  Neurological:     General: No focal deficit present.     Mental Status: He is alert and oriented to person, place, and time.     GCS: GCS eye subscore is 4. GCS verbal subscore is 5. GCS motor subscore is 6.     Cranial Nerves: No cranial nerve deficit.     Sensory: No sensory deficit.  Psychiatric:        Attention and Perception: Attention normal.        Mood and Affect: Mood normal.        Speech: Speech normal.        Behavior: Behavior normal. Behavior is cooperative.      UC Treatments / Results  Labs (all labs ordered are listed, but only abnormal results are displayed) Labs Reviewed - No data to display  EKG   Radiology No results found.  Procedures Procedures (including critical care time)  Medications Ordered in UC Medications - No data to display  Initial Impression / Assessment and Plan / UC Course  I have reviewed the triage vital signs and the nursing notes.  Pertinent labs & imaging results that were available during my care of the patient were reviewed by me and considered in my medical decision making (see chart for details).  Clinical  Course as of 05/03/23 1136  Mon May 03, 2023  1107 Patient offered flu and COVID testing, declined [JD]    Clinical Course User Index [JD] Younique Casad, Para March, NP  Discussed exam findings and plan of care with patient, strict go to ER precautions given.   Patient verbalized understanding to this provider.  Ddx: Viral URI w cough, allergies Final Clinical Impressions(s) / UC Diagnoses   Final diagnoses:  Viral URI with cough     Discharge Instructions      Most likely you have a viral illness: no antibiotic is indicated at this time, May treat with OTC meds of choice. Make sure to drink plenty of fluids to stay hydrated(gatorade, water, popsicles,jello,etc), avoid caffeine products. Follow up with PCP. Return as needed.     ED Prescriptions   None    PDMP not reviewed this encounter.   Clancy Gourd, NP 05/03/23 1136

## 2023-05-10 ENCOUNTER — Ambulatory Visit
Admission: EM | Admit: 2023-05-10 | Discharge: 2023-05-10 | Disposition: A | Discharge status: Home / Self Care | Payer: Medicare HMO | Attending: Emergency Medicine | Admitting: Emergency Medicine

## 2023-05-10 DIAGNOSIS — M5451 Vertebrogenic low back pain: Secondary | ICD-10-CM | POA: Diagnosis not present

## 2023-05-10 DIAGNOSIS — M5442 Lumbago with sciatica, left side: Secondary | ICD-10-CM | POA: Diagnosis not present

## 2023-05-10 DIAGNOSIS — M5413 Radiculopathy, cervicothoracic region: Secondary | ICD-10-CM | POA: Diagnosis not present

## 2023-05-10 DIAGNOSIS — M9903 Segmental and somatic dysfunction of lumbar region: Secondary | ICD-10-CM | POA: Diagnosis not present

## 2023-05-10 DIAGNOSIS — M461 Sacroiliitis, not elsewhere classified: Secondary | ICD-10-CM | POA: Diagnosis not present

## 2023-05-10 DIAGNOSIS — J069 Acute upper respiratory infection, unspecified: Secondary | ICD-10-CM

## 2023-05-10 DIAGNOSIS — M9904 Segmental and somatic dysfunction of sacral region: Secondary | ICD-10-CM | POA: Diagnosis not present

## 2023-05-10 DIAGNOSIS — M9901 Segmental and somatic dysfunction of cervical region: Secondary | ICD-10-CM | POA: Diagnosis not present

## 2023-05-10 MED ORDER — AZITHROMYCIN 250 MG PO TABS: 250.0000 mg | ORAL_TABLET | Freq: Every day | ORAL | 0 refills | Status: DC

## 2023-05-10 NOTE — ED Provider Notes (Signed)
 Alan Ball    CSN: [id number] Arrival date & time: 05/10/23  1120      History   Chief Complaint Chief Complaint  Patient presents with   Cough   Nasal Congestion   Wheezing    HPI ALHASSAN EVERINGHAM is a 73 y.o. male.  Patient presents with 2-week history of congestion and cough.  He reports occasional wheezing.  He has been treating his symptoms with Coricidin and Mucinex DM.  He has an albuterol  inhaler and has used it periodically.  No fever or shortness of breath.  Patient was seen at [facility] urgent care on 05/03/2023; diagnosed with viral URI with cough; treated symptomatically.  He was seen at [facility] urgent care on 02/28/2023; diagnosed with sinobronchitis; treated with amoxicillin  and Zithromax .  He denies asthma although it is listed in his medical history.  The history is provided by the patient and medical records.    Past Medical History:  Diagnosis Date   Allergy    Asthma    Hyperlipemia    Hypertension    Melanoma (HCC) 02/2022   on left side of nose   Tremor     Patient Active Problem List   Diagnosis Date Noted   Viral URI with cough 05/03/2023   Mass of soft tissue of left buttock 07/29/2020   Umbilical hernia 07/29/2020   Gastritis 07/29/2020   Parkinsonism (HCC) 10/11/2019   Cervical radiculopathy 02/21/2019   DDD (degenerative disc disease), cervical 02/21/2019   Bilateral carpal tunnel syndrome 05/19/2018   Trigger middle finger of right hand 12/20/2017   De Quervain's tenosynovitis, right 12/20/2017   Carpal tunnel syndrome of right wrist 12/20/2017   Renal cyst, right 09/05/2015   Inguinal hernia 10/04/2012    Past Surgical History:  Procedure Laterality Date   CARPAL TUNNEL RELEASE Bilateral 2019, 2020   CATARACT EXTRACTION BILATERAL W/ ANTERIOR VITRECTOMY Bilateral    cateracts     HERNIA REPAIR Bilateral    inguinal   SHOULDER SURGERY         Home Medications    Prior to Admission medications   Medication Sig Start  Date End Date Taking? Authorizing Provider  azithromycin  (ZITHROMAX ) 250 MG tablet Take 1 tablet (250 mg total) by mouth daily. Take first 2 tablets together, then 1 every day until finished. 05/10/23  Yes Corlis Burnard DEL, NP  albuterol  (VENTOLIN  HFA) 108 (90 Base) MCG/ACT inhaler Inhale 1-2 puffs into the lungs every 4 (four) hours as needed for wheezing or shortness of breath. 01/13/23   Levora Reyes SAUNDERS, MD  amantadine (SYMMETREL) 100 MG capsule Take 100 mg by mouth 2 (two) times daily. 01/27/22   [provider]  amLODipine  (NORVASC ) 5 MG tablet Take 1 tablet (5 mg total) by mouth daily. 01/13/23   Levora Reyes SAUNDERS, MD  atorvastatin  (LIPITOR) 10 MG tablet Take 1 tablet (10 mg total) by mouth daily. 01/13/23   Levora Reyes SAUNDERS, MD  carbidopa -levodopa  (SINEMET  IR) 25-100 MG tablet Take 1 tablet by mouth 3 (three) times daily. 01/31/19   [provider]  erythromycin  ophthalmic ointment Place a 1/2 inch ribbon of ointment into the lower eyelid. Patient not taking: Reported on 05/10/2023 01/15/23   Billy Asberry FALCON, PA-C  fluticasone (CUTIVATE) 0.05 % cream Apply topically 2 (two) times daily as needed. 02/13/22   [provider]  ibuprofen (ADVIL,MOTRIN) 200 MG tablet Take 200 mg by mouth every 6 (six) hours as needed for pain.    [provider]  Multiple Vitamin (MULTIVITAMIN) tablet Take 1 tablet by mouth daily.    [provider]  omeprazole  (PRILOSEC) 20 MG capsule Take 1 capsule (20 mg total) by mouth daily. 01/13/23   Levora Reyes SAUNDERS, MD  sildenafil (VIAGRA) 50 MG tablet Take 1 tablet by mouth daily as needed.    [provider]  trihexyphenidyl (ARTANE) 2 MG tablet Take 4 mg by mouth 2 (two) times daily.    [provider]  vitamin C (ASCORBIC ACID) 500 MG tablet Take 500 mg by mouth daily.    [provider]    Family History Family History  Problem Relation Age of Onset   Hypertension Mother    Thyroid  disease Mother     Cancer Father        brain tumor (gleoplastoma)   Diabetes Father    Colon polyps Father    Colon cancer Neg Hx    Stomach cancer Neg Hx    Rectal cancer Neg Hx    Esophageal cancer Neg Hx    Pancreatic cancer Neg Hx     Social History Social History   Tobacco Use   Smoking status: Never    Passive exposure: Never   Smokeless tobacco: Never  Vaping Use   Vaping status: Never Used  Substance Use Topics   Alcohol use: Yes    Comment: socially, seldom.   Drug use: Never     Allergies   Patient has no known allergies.   Review of Systems Review of Systems  Constitutional:  Negative for chills and fever.  HENT:  Positive for congestion. Negative for ear pain and sore throat.   Respiratory:  Positive for cough and wheezing. Negative for shortness of breath.   Cardiovascular:  Negative for chest pain and palpitations.     Physical Exam Triage Vital Signs ED Triage Vitals  Encounter Vitals Group     BP 05/10/23 1245 133/76     Systolic BP Percentile --      Diastolic BP Percentile --      Pulse Rate 05/10/23 1242 77     Resp 05/10/23 1242 18     Temp 05/10/23 1242 97.9 F (36.6 C)     Temp src --      SpO2 05/10/23 1245 96 %     Weight --      Height --      Head Circumference --      Peak Flow --      Pain Score 05/10/23 1236 2     Pain Loc --      Pain Education --      Exclude from Growth Chart --    No data found.  Updated Vital Signs BP 133/76   Pulse 77   Temp 97.9 F (36.6 C)   Resp 18   SpO2 96%   Visual Acuity Right Eye Distance:   Left Eye Distance:   Bilateral Distance:    Right Eye Near:   Left Eye Near:    Bilateral Near:     Physical Exam Constitutional:      General: He is not in acute distress. HENT:     Right Ear: Tympanic membrane normal.     Left Ear: Tympanic membrane normal.     Nose: Nose normal.     Mouth/Throat:     Mouth: Mucous membranes are moist.     Pharynx: Oropharynx is clear.  Cardiovascular:     Rate  and Rhythm: Normal rate and regular  rhythm.     Heart sounds: Normal heart sounds.  Pulmonary:     Effort: Pulmonary effort is normal. No respiratory distress.     Breath sounds: Normal breath sounds. No wheezing.  Neurological:     Mental Status: He is alert.      UC Treatments / Results  Labs (all labs ordered are listed, but only abnormal results are displayed) Labs Reviewed - No data to display  EKG   Radiology No results found.  Procedures Procedures (including critical care time)  Medications Ordered in UC Medications - No data to display  Initial Impression / Assessment and Plan / UC Course  I have reviewed the triage vital signs and the nursing notes.  Pertinent labs & imaging results that were available during my care of the patient were reviewed by me and considered in my medical decision making (see chart for details).   Acute upper respiratory infection.  Patient has been symptomatic for 2 weeks.  His lungs are clear at this time and O2 sat is 96%.  He reports his albuterol  inhaler is in date and has plenty of activations.  He declines prednisone  at this time.  Treating today with Zithromax .  Instructed him to schedule a follow-up appointment with his PCP.  Education provided on upper respiratory infection.  He agrees to plan of care.    Final Clinical Impressions(s) / UC Diagnoses   Final diagnoses:  Acute upper respiratory infection     Discharge Instructions      Take the Zithromax  as directed.  Follow up with your primary care provider.        ED Prescriptions     Medication Sig Dispense Auth. Provider   azithromycin  (ZITHROMAX ) 250 MG tablet Take 1 tablet (250 mg total) by mouth daily. Take first 2 tablets together, then 1 every day until finished. 6 tablet Corlis Burnard DEL, NP      PDMP not reviewed this encounter.   Corlis Burnard DEL, NP 05/10/23 [phone]

## 2023-05-10 NOTE — Discharge Instructions (Addendum)
 Take the Zithromax as directed.  Follow up with your primary care provider.

## 2023-05-10 NOTE — ED Triage Notes (Addendum)
 Patient to Urgent Care with complaints of  lingering nasal congestion/ wheezing/ cough. Denies any known fevers.  Symptoms started over two weeks ago. Reports cough is productive with green sputum.  Meds: coricidin/ Mucinex DM- no change in symptoms.

## 2023-05-14 ENCOUNTER — Ambulatory Visit: Payer: Medicare HMO | Admitting: Family Medicine

## 2023-05-14 ENCOUNTER — Encounter: Payer: Self-pay | Admitting: Family Medicine

## 2023-05-14 VITALS — BP 124/70 | HR 87 | Temp 99.0°F | Ht 68.0 in | Wt 169.0 lb

## 2023-05-14 DIAGNOSIS — R062 Wheezing: Secondary | ICD-10-CM | POA: Diagnosis not present

## 2023-05-14 DIAGNOSIS — J22 Unspecified acute lower respiratory infection: Secondary | ICD-10-CM | POA: Diagnosis not present

## 2023-05-14 DIAGNOSIS — J0100 Acute maxillary sinusitis, unspecified: Secondary | ICD-10-CM

## 2023-05-14 DIAGNOSIS — R052 Subacute cough: Secondary | ICD-10-CM

## 2023-05-14 MED ORDER — AMOXICILLIN-POT CLAVULANATE 875-125 MG PO TABS
1.0000 | ORAL_TABLET | Freq: Two times a day (BID) | ORAL | 0 refills | Status: DC
Start: 2023-05-14 — End: 2023-07-14

## 2023-05-14 MED ORDER — PREDNISONE 20 MG PO TABS
40.0000 mg | ORAL_TABLET | Freq: Every day | ORAL | 0 refills | Status: DC
Start: 2023-05-14 — End: 2023-07-14

## 2023-05-14 NOTE — Patient Instructions (Signed)
 Glad to hear that some of your chest symptoms have improved but I do hear some persistent wheezing.  Prednisone  2 pills/day for the next few days should take care of that, but okay to use the albuterol  as needed for now.  If wheezing and cough does not improve into next week, follow-up for recheck.  I suspect you may have a sinus infection but azithromycin  sometimes can be effective.  If no improvement in sinus symptoms in the next few days, I did print out additional antibiotic Augmentin  which is often more effective for a true sinus infection.  If any new side effects on that medication or any worsening symptoms let me know.  Follow-up late next week if not improving, sooner if worse.  Hope you feel better soon.  Sinus Infection, Adult A sinus infection, also called sinusitis, is inflammation of your sinuses. Sinuses are hollow spaces in the bones around your face. Your sinuses are located: Around your eyes. In the middle of your forehead. Behind your nose. In your cheekbones. Mucus normally drains out of your sinuses. When your nasal tissues become inflamed or swollen, mucus can become trapped or blocked. This allows bacteria, viruses, and fungi to grow, which leads to infection. Most infections of the sinuses are caused by a virus. A sinus infection can develop quickly. It can last for up to 4 weeks (acute) or for more than 12 weeks (chronic). A sinus infection often develops after a cold. What are the causes? This condition is caused by anything that creates swelling in the sinuses or stops mucus from draining. This includes: Allergies. Asthma. Infection from bacteria or viruses. Deformities or blockages in your nose or sinuses. Abnormal growths in the nose (nasal polyps). Pollutants, such as chemicals or irritants in the air. Infection from fungi. This is rare. What increases the risk? You are more likely to develop this condition if you: Have a weak body defense system (immune  system). Do a lot of swimming or diving. Overuse nasal sprays. Smoke. What are the signs or symptoms? The main symptoms of this condition are pain and a feeling of pressure around the affected sinuses. Other symptoms include: Stuffy nose or congestion that makes it difficult to breathe through your nose. Thick yellow or greenish drainage from your nose. Tenderness, swelling, and warmth over the affected sinuses. A cough that may get worse at night. Decreased sense of smell and taste. Extra mucus that collects in the throat or the back of the nose (postnasal drip) causing a sore throat or bad breath. Tiredness (fatigue). Fever. How is this diagnosed? This condition is diagnosed based on: Your symptoms. Your medical history. A physical exam. Tests to find out if your condition is acute or chronic. This may include: Checking your nose for nasal polyps. Viewing your sinuses using a device that has a light (endoscope). Testing for allergies or bacteria. Imaging tests, such as an MRI or CT scan. In rare cases, a bone biopsy may be done to rule out more serious types of fungal sinus disease. How is this treated? Treatment for a sinus infection depends on the cause and whether your condition is chronic or acute. If caused by a virus, your symptoms should go away on their own within 10 days. You may be given medicines to relieve symptoms. They include: Medicines that shrink swollen nasal passages (decongestants). A spray that eases inflammation of the nostrils (topical intranasal corticosteroids). Rinses that help get rid of thick mucus in your nose (nasal saline washes). Medicines  that treat allergies (antihistamines). Over-the-counter pain relievers. If caused by bacteria, your health care provider may recommend waiting to see if your symptoms improve. Most bacterial infections will get better without antibiotic medicine. You may be given antibiotics if you have: A severe infection. A  weak immune system. If caused by narrow nasal passages or nasal polyps, surgery may be needed. Follow these instructions at home: Medicines Take, use, or apply over-the-counter and prescription medicines only as told by your health care provider. These may include nasal sprays. If you were prescribed an antibiotic medicine, take it as told by your health care provider. Do not stop taking the antibiotic even if you start to feel better. Hydrate and humidify  Drink enough fluid to keep your urine pale yellow. Staying hydrated will help to thin your mucus. Use a cool mist humidifier to keep the humidity level in your home above 50%. Inhale steam for 10-15 minutes, 3-4 times a day, or as told by your health care provider. You can do this in the bathroom while a hot shower is running. Limit your exposure to cool or dry air. Rest Rest as much as possible. Sleep with your head raised (elevated). Make sure you get enough sleep each night. General instructions  Apply a warm, moist washcloth to your face 3-4 times a day or as told by your health care provider. This will help with discomfort. Use nasal saline washes as often as told by your health care provider. Wash your hands often with soap and water to reduce your exposure to germs. If soap and water are not available, use hand sanitizer. Do not smoke. Avoid being around people who are smoking (secondhand smoke). Keep all follow-up visits. This is important. Contact a health care provider if: You have a fever. Your symptoms get worse. Your symptoms do not improve within 10 days. Get help right away if: You have a severe headache. You have persistent vomiting. You have severe pain or swelling around your face or eyes. You have vision problems. You develop confusion. Your neck is stiff. You have trouble breathing. These symptoms may be an emergency. Get help right away. Call 911. Do not wait to see if the symptoms will go away. Do not  drive yourself to the hospital. Summary A sinus infection is soreness and inflammation of your sinuses. Sinuses are hollow spaces in the bones around your face. This condition is caused by nasal tissues that become inflamed or swollen. The swelling traps or blocks the flow of mucus. This allows bacteria, viruses, and fungi to grow, which leads to infection. If you were prescribed an antibiotic medicine, take it as told by your health care provider. Do not stop taking the antibiotic even if you start to feel better. Keep all follow-up visits. This is important. This information is not intended to replace advice given to you by your health care provider. Make sure you discuss any questions you have with your health care provider. Document Revised: 03/25/2021 Document Reviewed: 03/25/2021 Elsevier Patient Education  2024 [employer]   Cough, Adult Coughing is a reflex that clears your throat and airways (respiratory system). It helps heal and protect your lungs. It is normal to cough from time to time. A cough that happens with other symptoms or that lasts a long time may be a sign of a condition that needs treatment. A short-term (acute) cough may only last 2-3 weeks. A long-term (chronic) cough may last 8 or more weeks. Coughing is often caused by:  Diseases, such as: An infection of the respiratory system. Asthma or other heart or lung diseases. Gastroesophageal reflux. This is when acid comes back up from the stomach. Breathing in things that irritate your lungs. Allergies. Postnasal drip. This is when mucus runs down the back of your throat. Smoking. Some medicines. Follow these instructions at home: Medicines Take over-the-counter and prescription medicines only as told by your health care provider. Talk with your provider before you take cough medicine (cough suppressants). Eating and drinking Do not drink alcohol. Avoid caffeine. Drink enough fluid to keep your pee (urine) pale  yellow. Lifestyle Avoid cigarette smoke. Do not use any products that contain nicotine or tobacco. These products include cigarettes, chewing tobacco, and vaping devices, such as e-cigarettes. If you need help quitting, ask your provider. Avoid things that make you cough. These may include perfumes, candles, cleaning products, or campfire smoke. General instructions  Watch for any changes to your cough. Tell your provider about them. Always cover your mouth when you cough. If the air is dry in your bedroom or home, use a cool mist vaporizer or humidifier. If your cough is worse at night, try to sleep in a semi-upright position. Rest as needed. Contact a health care provider if: You have new symptoms, or your symptoms get worse. You cough up pus. You have a fever that does not go away or a cough that does not get better after 2-3 weeks. You cannot control your cough with medicine, and you are losing sleep. You have pain that gets worse or is not helped with medicine. You lose weight for no clear reason. You have night sweats. Get help right away if: You cough up blood. You have trouble breathing. Your heart is beating very fast. These symptoms may be an emergency. Get help right away. Call 911. Do not wait to see if the symptoms will go away. Do not drive yourself to the hospital. This information is not intended to replace advice given to you by your health care provider. Make sure you discuss any questions you have with your health care provider. Document Revised: 12/19/2021 Document Reviewed: 12/19/2021 Elsevier Patient Education  2024 [employer].

## 2023-05-14 NOTE — Progress Notes (Signed)
 Subjective:  Patient ID: Alan Ball, male    DOB: [birth year]  Age: 73 y.o. MRN: [id number]  CC:  Chief Complaint  Patient presents with   Hospitalization Follow-up    Pt has been sick since christmas went to urgent care, pt went on Monday this week, was given a Zpak notes he feels pretty much the same     HPI Alan Ball presents for   Cough, URI Chart reviewed.  Urgent care visit noted from December 30.  At that time had cough, congestion, body aches and fever for 3 days.  Sick contacts with grandchildren who are visiting over the holidays.  Flu and COVID testing was declined.  Diagnosed with likely viral illness, symptomatic care recommended.  He was seen a second time in urgent care on January 6.  At that time had been having persistent congestion and cough and occasional wheeze treated with Coricidin and Mucinex DM.  Intermittent use of albuterol .  No fever or dyspnea.  Lungs are clear on exam at that time and O2 sat was 96%.  Prednisone  discussed but declined, started on Zithromax , and follow-up with me.  Has been taking azithromycin  as prescribed. On last dose today.  Still some nasal congestion. Still some headache, teeth hurt, sinus pressure. Still with green nasal d/c. Sinus symptoms feel overall the same.  No fever.  Cough has eased up past few days. Wheezing has lessened. Last used inhaler a few days ago.  Eating and drinking ok.  No confusion/chest pain.  Using some otc nasal spray - unsure name. ? Nasocort.      History Patient Active Problem List   Diagnosis Date Noted   Viral URI with cough 05/03/2023   Mass of soft tissue of left buttock 07/29/2020   Umbilical hernia 07/29/2020   Gastritis 07/29/2020   Parkinsonism (HCC) 10/11/2019   Cervical radiculopathy 02/21/2019   DDD (degenerative disc disease), cervical 02/21/2019   Bilateral carpal tunnel syndrome 05/19/2018   Trigger middle finger of right hand 12/20/2017   De Quervain's tenosynovitis, right  12/20/2017   Carpal tunnel syndrome of right wrist 12/20/2017   Renal cyst, right 09/05/2015   Inguinal hernia 10/04/2012   Past Medical History:  Diagnosis Date   Allergy    Asthma    Hyperlipemia    Hypertension    Melanoma (HCC) 02/2022   on left side of nose   Tremor    Past Surgical History:  Procedure Laterality Date   CARPAL TUNNEL RELEASE Bilateral 2019, 2020   CATARACT EXTRACTION BILATERAL W/ ANTERIOR VITRECTOMY Bilateral    cateracts     HERNIA REPAIR Bilateral    inguinal   SHOULDER SURGERY     No Known Allergies Prior to Admission medications   Medication Sig Start Date End Date Taking? Authorizing Provider  albuterol  (VENTOLIN  HFA) 108 (90 Base) MCG/ACT inhaler Inhale 1-2 puffs into the lungs every 4 (four) hours as needed for wheezing or shortness of breath. 01/13/23  Yes Levora Reyes SAUNDERS, MD  amantadine (SYMMETREL) 100 MG capsule Take 100 mg by mouth 2 (two) times daily. 01/27/22  Yes [provider]  amLODipine  (NORVASC ) 5 MG tablet Take 1 tablet (5 mg total) by mouth daily. 01/13/23  Yes Levora Reyes SAUNDERS, MD  atorvastatin  (LIPITOR) 10 MG tablet Take 1 tablet (10 mg total) by mouth daily. 01/13/23  Yes Levora Reyes SAUNDERS, MD  carbidopa -levodopa  (SINEMET  IR) 25-100 MG tablet Take 1 tablet by mouth 3 (three) times daily. 01/31/19  Yes  [provider]  erythromycin  ophthalmic ointment Place a 1/2 inch ribbon of ointment into the lower eyelid. 01/15/23  Yes Billy Asberry FALCON, PA-C  fluticasone (CUTIVATE) 0.05 % cream Apply topically 2 (two) times daily as needed. 02/13/22  Yes [provider]  ibuprofen (ADVIL,MOTRIN) 200 MG tablet Take 200 mg by mouth every 6 (six) hours as needed for pain.   Yes [provider]  Multiple Vitamin (MULTIVITAMIN) tablet Take 1 tablet by mouth daily.   Yes [provider]  omeprazole  (PRILOSEC) 20 MG capsule Take 1 capsule (20 mg total) by mouth daily. 01/13/23  Yes Levora Reyes SAUNDERS, MD  sildenafil  (VIAGRA) 50 MG tablet Take 1 tablet by mouth daily as needed.   Yes [provider]  trihexyphenidyl (ARTANE) 2 MG tablet Take 4 mg by mouth 2 (two) times daily.   Yes [provider]  vitamin C (ASCORBIC ACID) 500 MG tablet Take 500 mg by mouth daily.   Yes [provider]  azithromycin  (ZITHROMAX ) 250 MG tablet Take 1 tablet (250 mg total) by mouth daily. Take first 2 tablets together, then 1 every day until finished. Patient not taking: Reported on 05/14/2023 05/10/23   Corlis Burnard DEL, NP   Social History   Socioeconomic History   Marital status: Married    Spouse name: Cy   Number of children: Not on file   Years of education: Not on file   Highest education level: Bachelor's degree (e.g., BA, AB, BS)  Occupational History    Comment: retired  Tobacco Use   Smoking status: Never    Passive exposure: Never   Smokeless tobacco: Never  Vaping Use   Vaping status: Never Used  Substance and Sexual Activity   Alcohol use: Yes    Comment: socially, seldom.   Drug use: Never   Sexual activity: Not Currently  Other Topics Concern   Not on file  Social History Narrative   Lives with wife   Caffeine- coffee 3 cups daily, maybe a soda   Social Drivers of Health   Financial Resource Strain: Low Risk  (12/03/2022)   Overall Financial Resource Strain (CARDIA)    Difficulty of Paying Living Expenses: Not hard at all  Food Insecurity: No Food Insecurity (12/03/2022)   Hunger Vital Sign    Worried About Running Out of Food in the Last Year: Never true    Ran Out of Food in the Last Year: Never true  Transportation Needs: No Transportation Needs (12/03/2022)   PRAPARE - Administrator, Civil Service (Medical): No    Lack of Transportation (Non-Medical): No  Physical Activity: Sufficiently Active (11/26/2021)   Exercise Vital Sign    Days of Exercise per Week: 5 days    Minutes of Exercise per Session: 30 min  Stress: No Stress Concern Present (12/03/2022)    [employer] of Occupational Health - Occupational Stress Questionnaire    Feeling of Stress : Not at all  Social Connections: Moderately Integrated (12/03/2022)   Social Connection and Isolation Panel [NHANES]    Frequency of Communication with Friends and Family: More than three times a week    Frequency of Social Gatherings with Friends and Family: Three times a week    Attends Religious Services: More than 4 times per year    Active Member of Clubs or Organizations: No    Attends Banker Meetings: Never    Marital Status: Married  Catering Manager Violence: Not At Risk (12/03/2022)  Humiliation, Afraid, Rape, and Kick questionnaire    Fear of Current or Ex-Partner: No    Emotionally Abused: No    Physically Abused: No    Sexually Abused: No    Review of Systems  Per hPI.  Objective:   Vitals:   05/14/23 1059  BP: 124/70  Pulse: 87  Temp: 99 F (37.2 C)  TempSrc: Temporal  SpO2: 97%  Weight: 169 lb (76.7 kg)  Height: 5' 8 (1.727 m)     Physical Exam Vitals reviewed.  Constitutional:      Appearance: He is well-developed.  HENT:     Head: Normocephalic and atraumatic.     Right Ear: Tympanic membrane, ear canal and external ear normal.     Left Ear: Tympanic membrane, ear canal and external ear normal.     Nose: No rhinorrhea.     Comments: Maxillary sinus tenderness bilaterally.    Mouth/Throat:     Pharynx: No oropharyngeal exudate or posterior oropharyngeal erythema.  Eyes:     Conjunctiva/sclera: Conjunctivae normal.     Pupils: Pupils are equal, round, and reactive to light.  Cardiovascular:     Rate and Rhythm: Normal rate and regular rhythm.     Heart sounds: Normal heart sounds. No murmur heard. Pulmonary:     Effort: Pulmonary effort is normal.     Breath sounds: Wheezing (Few scattered and expiratory wheezes.  Normal effort, no distress.  No appreciable rhonchi.) present. No rhonchi or rales.  Abdominal:     Palpations: Abdomen  is soft.     Tenderness: There is no abdominal tenderness.  Musculoskeletal:     Cervical back: Neck supple.  Lymphadenopathy:     Cervical: No cervical adenopathy.  Skin:    General: Skin is warm and dry.     Findings: No rash.  Neurological:     Mental Status: He is alert and oriented to person, place, and time.  Psychiatric:        Behavior: Behavior normal.       Assessment & Plan:  Alan Ball is a 73 y.o. male . Subacute cough - Plan: predniSONE  (DELTASONE ) 20 MG tablet  LRTI (lower respiratory tract infection) - Plan: predniSONE  (DELTASONE ) 20 MG tablet  Wheeze - Plan: predniSONE  (DELTASONE ) 20 MG tablet  Acute maxillary sinusitis, recurrence not specified - Plan: amoxicillin -clavulanate (AUGMENTIN ) 875-125 MG tablet  Probable initial viral infection with persistent cough, intermittent wheeze.  Wheeze noted on exam.  Some improvement with chest symptoms but persistent sinusitis symptoms.  He is on day 5 of azithromycin , discussed long-acting nature that medication, and possible improvement in sinus symptoms with a few more days of treatment.  Will start prednisone  for persistent wheeze, Augmentin  printed if not improving the next few days for sinusitis and additional chest coverage with potential additive side effects and RTC precautions given.  Saline nasal spray discussed for symptoms as well as continued use of albuterol  if needed.   Meds ordered this encounter  Medications   predniSONE  (DELTASONE ) 20 MG tablet    Sig: Take 2 tablets (40 mg total) by mouth daily with breakfast.    Dispense:  6 tablet    Refill:  0   amoxicillin -clavulanate (AUGMENTIN ) 875-125 MG tablet    Sig: Take 1 tablet by mouth 2 (two) times daily.    Dispense:  20 tablet    Refill:  0   Patient Instructions  Glad to hear that some of your chest symptoms have improved but I do  hear some persistent wheezing.  Prednisone  2 pills/day for the next few days should take care of that, but okay to  use the albuterol  as needed for now.  If wheezing and cough does not improve into next week, follow-up for recheck.  I suspect you may have a sinus infection but azithromycin  sometimes can be effective.  If no improvement in sinus symptoms in the next few days, I did print out additional antibiotic Augmentin  which is often more effective for a true sinus infection.  If any new side effects on that medication or any worsening symptoms let me know.  Follow-up late next week if not improving, sooner if worse.  Hope you feel better soon.  Sinus Infection, Adult A sinus infection, also called sinusitis, is inflammation of your sinuses. Sinuses are hollow spaces in the bones around your face. Your sinuses are located: Around your eyes. In the middle of your forehead. Behind your nose. In your cheekbones. Mucus normally drains out of your sinuses. When your nasal tissues become inflamed or swollen, mucus can become trapped or blocked. This allows bacteria, viruses, and fungi to grow, which leads to infection. Most infections of the sinuses are caused by a virus. A sinus infection can develop quickly. It can last for up to 4 weeks (acute) or for more than 12 weeks (chronic). A sinus infection often develops after a cold. What are the causes? This condition is caused by anything that creates swelling in the sinuses or stops mucus from draining. This includes: Allergies. Asthma. Infection from bacteria or viruses. Deformities or blockages in your nose or sinuses. Abnormal growths in the nose (nasal polyps). Pollutants, such as chemicals or irritants in the air. Infection from fungi. This is rare. What increases the risk? You are more likely to develop this condition if you: Have a weak body defense system (immune system). Do a lot of swimming or diving. Overuse nasal sprays. Smoke. What are the signs or symptoms? The main symptoms of this condition are pain and a feeling of pressure around the  affected sinuses. Other symptoms include: Stuffy nose or congestion that makes it difficult to breathe through your nose. Thick yellow or greenish drainage from your nose. Tenderness, swelling, and warmth over the affected sinuses. A cough that may get worse at night. Decreased sense of smell and taste. Extra mucus that collects in the throat or the back of the nose (postnasal drip) causing a sore throat or bad breath. Tiredness (fatigue). Fever. How is this diagnosed? This condition is diagnosed based on: Your symptoms. Your medical history. A physical exam. Tests to find out if your condition is acute or chronic. This may include: Checking your nose for nasal polyps. Viewing your sinuses using a device that has a light (endoscope). Testing for allergies or bacteria. Imaging tests, such as an MRI or CT scan. In rare cases, a bone biopsy may be done to rule out more serious types of fungal sinus disease. How is this treated? Treatment for a sinus infection depends on the cause and whether your condition is chronic or acute. If caused by a virus, your symptoms should go away on their own within 10 days. You may be given medicines to relieve symptoms. They include: Medicines that shrink swollen nasal passages (decongestants). A spray that eases inflammation of the nostrils (topical intranasal corticosteroids). Rinses that help get rid of thick mucus in your nose (nasal saline washes). Medicines that treat allergies (antihistamines). Over-the-counter pain relievers. If caused by bacteria, your health  care provider may recommend waiting to see if your symptoms improve. Most bacterial infections will get better without antibiotic medicine. You may be given antibiotics if you have: A severe infection. A weak immune system. If caused by narrow nasal passages or nasal polyps, surgery may be needed. Follow these instructions at home: Medicines Take, use, or apply over-the-counter and  prescription medicines only as told by your health care provider. These may include nasal sprays. If you were prescribed an antibiotic medicine, take it as told by your health care provider. Do not stop taking the antibiotic even if you start to feel better. Hydrate and humidify  Drink enough fluid to keep your urine pale yellow. Staying hydrated will help to thin your mucus. Use a cool mist humidifier to keep the humidity level in your home above 50%. Inhale steam for 10-15 minutes, 3-4 times a day, or as told by your health care provider. You can do this in the bathroom while a hot shower is running. Limit your exposure to cool or dry air. Rest Rest as much as possible. Sleep with your head raised (elevated). Make sure you get enough sleep each night. General instructions  Apply a warm, moist washcloth to your face 3-4 times a day or as told by your health care provider. This will help with discomfort. Use nasal saline washes as often as told by your health care provider. Wash your hands often with soap and water to reduce your exposure to germs. If soap and water are not available, use hand sanitizer. Do not smoke. Avoid being around people who are smoking (secondhand smoke). Keep all follow-up visits. This is important. Contact a health care provider if: You have a fever. Your symptoms get worse. Your symptoms do not improve within 10 days. Get help right away if: You have a severe headache. You have persistent vomiting. You have severe pain or swelling around your face or eyes. You have vision problems. You develop confusion. Your neck is stiff. You have trouble breathing. These symptoms may be an emergency. Get help right away. Call 911. Do not wait to see if the symptoms will go away. Do not drive yourself to the hospital. Summary A sinus infection is soreness and inflammation of your sinuses. Sinuses are hollow spaces in the bones around your face. This condition is caused  by nasal tissues that become inflamed or swollen. The swelling traps or blocks the flow of mucus. This allows bacteria, viruses, and fungi to grow, which leads to infection. If you were prescribed an antibiotic medicine, take it as told by your health care provider. Do not stop taking the antibiotic even if you start to feel better. Keep all follow-up visits. This is important. This information is not intended to replace advice given to you by your health care provider. Make sure you discuss any questions you have with your health care provider. Document Revised: 03/25/2021 Document Reviewed: 03/25/2021 Elsevier Patient Education  2024 Else[employer] Inc.   Cough, Adult Coughing is a reflex that clears your throat and airways (respiratory system). It helps heal and protect your lungs. It is normal to cough from time to time. A cough that happens with other symptoms or that lasts a long time may be a sign of a condition that needs treatment. A short-term (acute) cough may only last 2-3 weeks. A long-term (chronic) cough may last 8 or more weeks. Coughing is often caused by: Diseases, such as: An infection of the respiratory system. Asthma or other heart  or lung diseases. Gastroesophageal reflux. This is when acid comes back up from the stomach. Breathing in things that irritate your lungs. Allergies. Postnasal drip. This is when mucus runs down the back of your throat. Smoking. Some medicines. Follow these instructions at home: Medicines Take over-the-counter and prescription medicines only as told by your health care provider. Talk with your provider before you take cough medicine (cough suppressants). Eating and drinking Do not drink alcohol. Avoid caffeine. Drink enough fluid to keep your pee (urine) pale yellow. Lifestyle Avoid cigarette smoke. Do not use any products that contain nicotine or tobacco. These products include cigarettes, chewing tobacco, and vaping devices, such as  e-cigarettes. If you need help quitting, ask your provider. Avoid things that make you cough. These may include perfumes, candles, cleaning products, or campfire smoke. General instructions  Watch for any changes to your cough. Tell your provider about them. Always cover your mouth when you cough. If the air is dry in your bedroom or home, use a cool mist vaporizer or humidifier. If your cough is worse at night, try to sleep in a semi-upright position. Rest as needed. Contact a health care provider if: You have new symptoms, or your symptoms get worse. You cough up pus. You have a fever that does not go away or a cough that does not get better after 2-3 weeks. You cannot control your cough with medicine, and you are losing sleep. You have pain that gets worse or is not helped with medicine. You lose weight for no clear reason. You have night sweats. Get help right away if: You cough up blood. You have trouble breathing. Your heart is beating very fast. These symptoms may be an emergency. Get help right away. Call 911. Do not wait to see if the symptoms will go away. Do not drive yourself to the hospital. This information is not intended to replace advice given to you by your health care provider. Make sure you discuss any questions you have with your health care provider. Document Revised: 12/19/2021 Document Reviewed: 12/19/2021 Elsevier Patient Education  2024 Elsevier Inc.     Signed,   Reyes Pines, MD [facility], [facility] 05/14/23 11:48 AM

## 2023-06-15 DIAGNOSIS — M9903 Segmental and somatic dysfunction of lumbar region: Secondary | ICD-10-CM | POA: Diagnosis not present

## 2023-06-15 DIAGNOSIS — M5442 Lumbago with sciatica, left side: Secondary | ICD-10-CM | POA: Diagnosis not present

## 2023-06-15 DIAGNOSIS — M9904 Segmental and somatic dysfunction of sacral region: Secondary | ICD-10-CM | POA: Diagnosis not present

## 2023-06-15 DIAGNOSIS — M5451 Vertebrogenic low back pain: Secondary | ICD-10-CM | POA: Diagnosis not present

## 2023-06-15 DIAGNOSIS — M9901 Segmental and somatic dysfunction of cervical region: Secondary | ICD-10-CM | POA: Diagnosis not present

## 2023-06-15 DIAGNOSIS — M5413 Radiculopathy, cervicothoracic region: Secondary | ICD-10-CM | POA: Diagnosis not present

## 2023-06-15 DIAGNOSIS — M461 Sacroiliitis, not elsewhere classified: Secondary | ICD-10-CM | POA: Diagnosis not present

## 2023-06-29 DIAGNOSIS — Z85828 Personal history of other malignant neoplasm of skin: Secondary | ICD-10-CM | POA: Diagnosis not present

## 2023-06-29 DIAGNOSIS — Z08 Encounter for follow-up examination after completed treatment for malignant neoplasm: Secondary | ICD-10-CM | POA: Diagnosis not present

## 2023-07-14 ENCOUNTER — Ambulatory Visit: Payer: Medicare HMO | Admitting: Family Medicine

## 2023-07-14 ENCOUNTER — Encounter: Payer: Self-pay | Admitting: Family Medicine

## 2023-07-14 VITALS — BP 124/68 | HR 74 | Temp 99.4°F | Ht 68.0 in | Wt 175.4 lb

## 2023-07-14 DIAGNOSIS — J9801 Acute bronchospasm: Secondary | ICD-10-CM | POA: Diagnosis not present

## 2023-07-14 DIAGNOSIS — E785 Hyperlipidemia, unspecified: Secondary | ICD-10-CM | POA: Diagnosis not present

## 2023-07-14 DIAGNOSIS — K219 Gastro-esophageal reflux disease without esophagitis: Secondary | ICD-10-CM

## 2023-07-14 DIAGNOSIS — I10 Essential (primary) hypertension: Secondary | ICD-10-CM | POA: Diagnosis not present

## 2023-07-14 LAB — LIPID PANEL
Cholesterol: 172 mg/dL (ref 0–200)
HDL: 68 mg/dL (ref >39.00)
LDL Cholesterol: 90 mg/dL (ref 0–99)
NonHDL: 103.84
Total CHOL/HDL Ratio: 3
Triglycerides: 70 mg/dL (ref 0.0–149.0)
VLDL: 14 mg/dL (ref 0.0–40.0)

## 2023-07-14 LAB — COMPREHENSIVE METABOLIC PANEL
ALT: 8 U/L (ref 0–53)
AST: 24 U/L (ref 0–37)
Albumin: 4.5 g/dL (ref 3.5–5.2)
Alkaline Phosphatase: 80 U/L (ref 39–117)
BUN: 18 mg/dL (ref 6–23)
CO2: 31 meq/L (ref 19–32)
Calcium: 9.8 mg/dL (ref 8.4–10.5)
Chloride: 101 meq/L (ref 96–112)
Creatinine, Ser: 0.98 mg/dL (ref 0.40–1.50)
GFR: 75.89 mL/min (ref >60.00)
Glucose, Bld: 84 mg/dL (ref 70–99)
Potassium: 4.6 meq/L (ref 3.5–5.1)
Sodium: 138 meq/L (ref 135–145)
Total Bilirubin: 0.7 mg/dL (ref 0.2–1.2)
Total Protein: 7.2 g/dL (ref 6.0–8.3)

## 2023-07-14 NOTE — Patient Instructions (Signed)
 Thank you for coming in today.  No change in medications for now.  If you do require albuterol more frequently let me know.  Blood pressure looks good today.  If any concerns on labs I will let you know, follow-up in 6 months for physical, sooner if needed.  Let me know if you do need a referral to a new neurologist.  Take care!

## 2023-07-14 NOTE — Progress Notes (Signed)
 Subjective:  Patient ID: Alan Ball, male    DOB: 10-06-1950  Age: 73 y.o. MRN: [id number]  CC:  Chief Complaint  Patient presents with   Medical Management of Chronic Issues    Pt is well     HPI Alan Ball presents for   Subacute cough, with history of mild intermittent asthma. Typically albuterol used infrequently.  He was seen for cough in January.  On treatment for sinusitis at that time with some underlying wheeze.  Started on prednisone, Augmentin prescription printed if not improving within a few days for sinusitis and additional lung coverage.  Saline nasal spray discussed for congestion as well as albuterol if needed. Cough has cleared. Rare use of albuterol - not needed recently.  Nasacort nasal spray for allergies.   Will be seeing new neurologist. On carbidopa/levodopa. Followed by [facility]. Considering local neuro - he will let me know if referral needed.   Hyperlipidemia: Lipitor 10 mg daily without new myalgias/side effects.  Lab Results  Component Value Date   CHOL 143 01/13/2023   HDL 60.10 01/13/2023   LDLCALC 70 01/13/2023   TRIG 63.0 01/13/2023   CHOLHDL 2 01/13/2023   Lab Results  Component Value Date   ALT 4 01/13/2023   AST 19 01/13/2023   ALKPHOS 75 01/13/2023   BILITOT 0.9 01/13/2023   GERD Treated with omeprazole.  Managing symptoms.  EGD and colonoscopy in November 2023.taking PPI daily.   Hypertension: Amlodipine 5 mg daily without missed doses.  Denies new side effects including new leg swelling, chest pain, headache or dyspnea. Home readings:130/78.   BP Readings from Last 3 Encounters:  07/14/23 124/68  05/14/23 124/70  05/10/23 133/76   Lab Results  Component Value Date   CREATININE 0.97 01/13/2023      History Patient Active Problem List   Diagnosis Date Noted   Viral URI with cough 05/03/2023   Mass of soft tissue of left buttock 07/29/2020   Umbilical hernia 07/29/2020   Gastritis 07/29/2020   Parkinsonism (HCC)  10/11/2019   Cervical radiculopathy 02/21/2019   DDD (degenerative disc disease), cervical 02/21/2019   Bilateral carpal tunnel syndrome 05/19/2018   Trigger middle finger of right hand 12/20/2017   De Quervain's tenosynovitis, right 12/20/2017   Carpal tunnel syndrome of right wrist 12/20/2017   Renal cyst, right 09/05/2015   Inguinal hernia 10/04/2012   Past Medical History:  Diagnosis Date   Allergy    Asthma    Hyperlipemia    Hypertension    Melanoma (HCC) 02/2022   on left side of nose   Tremor    Past Surgical History:  Procedure Laterality Date   CARPAL TUNNEL RELEASE Bilateral 2019, 2020   CATARACT EXTRACTION BILATERAL W/ ANTERIOR VITRECTOMY Bilateral    cateracts     HERNIA REPAIR Bilateral    inguinal   SHOULDER SURGERY     No Known Allergies Prior to Admission medications   Medication Sig Start Date End Date Taking? Authorizing Provider  albuterol (VENTOLIN HFA) 108 (90 Base) MCG/ACT inhaler Inhale 1-2 puffs into the lungs every 4 (four) hours as needed for wheezing or shortness of breath. 01/13/23  Yes Shade Flood, MD  amLODipine (NORVASC) 5 MG tablet Take 1 tablet (5 mg total) by mouth daily. 01/13/23  Yes Shade Flood, MD  atorvastatin (LIPITOR) 10 MG tablet Take 1 tablet (10 mg total) by mouth daily. 01/13/23  Yes Shade Flood, MD  azithromycin (ZITHROMAX) 250 MG tablet Take  1 tablet (250 mg total) by mouth daily. Take first 2 tablets together, then 1 every day until finished. 05/10/23  Yes Mickie Bail, NP  carbidopa-levodopa (SINEMET IR) 25-100 MG tablet Take 1 tablet by mouth 3 (three) times daily. 01/31/19  Yes [provider]  erythromycin ophthalmic ointment Place a 1/2 inch ribbon of ointment into the lower eyelid. 01/15/23  Yes Tomi Bamberger, PA-C  fluticasone (CUTIVATE) 0.05 % cream Apply topically 2 (two) times daily as needed. 02/13/22  Yes [provider]  ibuprofen (ADVIL,MOTRIN) 200 MG tablet Take 200 mg by mouth  every 6 (six) hours as needed for pain.   Yes [provider]  Multiple Vitamin (MULTIVITAMIN) tablet Take 1 tablet by mouth daily.   Yes [provider]  omeprazole (PRILOSEC) 20 MG capsule Take 1 capsule (20 mg total) by mouth daily. 01/13/23  Yes Shade Flood, MD  predniSONE (DELTASONE) 20 MG tablet Take 2 tablets (40 mg total) by mouth daily with breakfast. 05/14/23  Yes Shade Flood, MD  sildenafil (VIAGRA) 50 MG tablet Take 1 tablet by mouth daily as needed.   Yes [provider]  vitamin C (ASCORBIC ACID) 500 MG tablet Take 500 mg by mouth daily.   Yes [provider]  amantadine (SYMMETREL) 100 MG capsule Take 100 mg by mouth 2 (two) times daily. Patient not taking: Reported on 07/14/2023 01/27/22   [provider]  trihexyphenidyl (ARTANE) 2 MG tablet Take 4 mg by mouth 2 (two) times daily. Patient not taking: Reported on 07/14/2023    [provider]   Social History   Socioeconomic History   Marital status: Married    Spouse name: Carney Bern   Number of children: Not on file   Years of education: Not on file   Highest education level: Bachelor's degree (e.g., BA, AB, BS)  Occupational History    Comment: retired  Tobacco Use   Smoking status: Never    Passive exposure: Never   Smokeless tobacco: Never  Vaping Use   Vaping status: Never Used  Substance and Sexual Activity   Alcohol use: Yes    Comment: socially, seldom.   Drug use: Never   Sexual activity: Not Currently  Other Topics Concern   Not on file  Social History Narrative   Lives with wife   Caffeine- coffee 3 cups daily, maybe a soda   Social Drivers of Health   Financial Resource Strain: Low Risk  (12/03/2022)   Overall Financial Resource Strain (CARDIA)    Difficulty of Paying Living Expenses: Not hard at all  Food Insecurity: No Food Insecurity (12/03/2022)   Hunger Vital Sign    Worried About Running Out of Food in the Last Year: Never true    Ran  Out of Food in the Last Year: Never true  Transportation Needs: No Transportation Needs (12/03/2022)   PRAPARE - Administrator, Civil Service (Medical): No    Lack of Transportation (Non-Medical): No  Physical Activity: Sufficiently Active (11/26/2021)   Exercise Vital Sign    Days of Exercise per Week: 5 days    Minutes of Exercise per Session: 30 min  Stress: No Stress Concern Present (12/03/2022)   [employer] of Occupational Health - Occupational Stress Questionnaire    Feeling of Stress : Not at all  Social Connections: Moderately Integrated (12/03/2022)   Social Connection and Isolation Panel [NHANES]    Frequency of Communication with Friends and Family: More than three times  a week    Frequency of Social Gatherings with Friends and Family: Three times a week    Attends Religious Services: More than 4 times per year    Active Member of Clubs or Organizations: No    Attends Banker Meetings: Never    Marital Status: Married  Catering manager Violence: Not At Risk (12/03/2022)   Humiliation, Afraid, Rape, and Kick questionnaire    Fear of Current or Ex-Partner: No    Emotionally Abused: No    Physically Abused: No    Sexually Abused: No    Review of Systems Per HPI  Objective:   Vitals:   07/14/23 1056  BP: 124/68  Pulse: 74  Temp: 99.4 F (37.4 C)  TempSrc: Temporal  SpO2: 96%  Weight: 175 lb 6.4 oz (79.6 kg)  Height: 5\' 8"  (1.727 m)     Physical Exam Vitals reviewed.  Constitutional:      Appearance: He is well-developed.  HENT:     Head: Normocephalic and atraumatic.  Neck:     Vascular: No carotid bruit or JVD.  Cardiovascular:     Rate and Rhythm: Normal rate and regular rhythm.     Heart sounds: Normal heart sounds. No murmur heard. Pulmonary:     Effort: Pulmonary effort is normal.     Breath sounds: Normal breath sounds. No rales.  Musculoskeletal:     Right lower leg: No edema.     Left lower leg: No edema.  Skin:     General: Skin is warm and dry.  Neurological:     Mental Status: He is alert and oriented to person, place, and time.     Comments: Tremor noted.   Psychiatric:        Mood and Affect: Mood normal.        Assessment & Plan:  ULYSESS WITZ is a 73 y.o. male . Gastroesophageal reflux disease, unspecified whether esophagitis present  -Stable current med regimen, continue same, RTC precautions.  Hyperlipidemia, unspecified hyperlipidemia type - Plan: Comprehensive metabolic panel, Lipid panel  -Check labs, adjust regimen accordingly.  Continue Lipitor same dose for now.  Essential hypertension - Plan: Comprehensive metabolic panel  -Tolerating low-dose Norvasc, continue same, check labs and adjust plan accordingly  Bronchospasm Improved.  RTC precautions given if frequent use/need of albuterol.  No orders of the defined types were placed in this encounter.  Patient Instructions  Thank you for coming in today.  No change in medications for now.  If you do require albuterol more frequently let me know.  Blood pressure looks good today.  If any concerns on labs I will let you know, follow-up in 6 months for physical, sooner if needed.  Let me know if you do need a referral to a new neurologist.  Take care!    Signed,   Meredith Staggers, MD  Primary Care, [facility] 07/14/23 11:36 AM

## 2023-07-15 DIAGNOSIS — M9901 Segmental and somatic dysfunction of cervical region: Secondary | ICD-10-CM | POA: Diagnosis not present

## 2023-07-15 DIAGNOSIS — M5442 Lumbago with sciatica, left side: Secondary | ICD-10-CM | POA: Diagnosis not present

## 2023-07-15 DIAGNOSIS — M461 Sacroiliitis, not elsewhere classified: Secondary | ICD-10-CM | POA: Diagnosis not present

## 2023-07-15 DIAGNOSIS — M9903 Segmental and somatic dysfunction of lumbar region: Secondary | ICD-10-CM | POA: Diagnosis not present

## 2023-07-15 DIAGNOSIS — M9904 Segmental and somatic dysfunction of sacral region: Secondary | ICD-10-CM | POA: Diagnosis not present

## 2023-07-15 DIAGNOSIS — M5413 Radiculopathy, cervicothoracic region: Secondary | ICD-10-CM | POA: Diagnosis not present

## 2023-07-15 DIAGNOSIS — M5451 Vertebrogenic low back pain: Secondary | ICD-10-CM | POA: Diagnosis not present

## 2023-07-17 ENCOUNTER — Encounter: Payer: Self-pay | Admitting: Family Medicine

## 2023-07-21 DIAGNOSIS — M1712 Unilateral primary osteoarthritis, left knee: Secondary | ICD-10-CM | POA: Diagnosis not present

## 2023-08-03 DIAGNOSIS — M1712 Unilateral primary osteoarthritis, left knee: Secondary | ICD-10-CM | POA: Diagnosis not present

## 2023-08-11 DIAGNOSIS — M1712 Unilateral primary osteoarthritis, left knee: Secondary | ICD-10-CM | POA: Diagnosis not present

## 2023-08-12 DIAGNOSIS — M9904 Segmental and somatic dysfunction of sacral region: Secondary | ICD-10-CM | POA: Diagnosis not present

## 2023-08-12 DIAGNOSIS — M9903 Segmental and somatic dysfunction of lumbar region: Secondary | ICD-10-CM | POA: Diagnosis not present

## 2023-08-12 DIAGNOSIS — M5413 Radiculopathy, cervicothoracic region: Secondary | ICD-10-CM | POA: Diagnosis not present

## 2023-08-12 DIAGNOSIS — M9901 Segmental and somatic dysfunction of cervical region: Secondary | ICD-10-CM | POA: Diagnosis not present

## 2023-08-12 DIAGNOSIS — M461 Sacroiliitis, not elsewhere classified: Secondary | ICD-10-CM | POA: Diagnosis not present

## 2023-08-12 DIAGNOSIS — M5451 Vertebrogenic low back pain: Secondary | ICD-10-CM | POA: Diagnosis not present

## 2023-08-12 DIAGNOSIS — M5442 Lumbago with sciatica, left side: Secondary | ICD-10-CM | POA: Diagnosis not present

## 2023-08-18 DIAGNOSIS — M1712 Unilateral primary osteoarthritis, left knee: Secondary | ICD-10-CM | POA: Diagnosis not present

## 2023-08-23 ENCOUNTER — Ambulatory Visit: Payer: Medicare HMO | Admitting: Podiatry

## 2023-09-09 DIAGNOSIS — M9901 Segmental and somatic dysfunction of cervical region: Secondary | ICD-10-CM | POA: Diagnosis not present

## 2023-09-09 DIAGNOSIS — M9903 Segmental and somatic dysfunction of lumbar region: Secondary | ICD-10-CM | POA: Diagnosis not present

## 2023-09-09 DIAGNOSIS — M5413 Radiculopathy, cervicothoracic region: Secondary | ICD-10-CM | POA: Diagnosis not present

## 2023-09-09 DIAGNOSIS — M5442 Lumbago with sciatica, left side: Secondary | ICD-10-CM | POA: Diagnosis not present

## 2023-09-09 DIAGNOSIS — M9904 Segmental and somatic dysfunction of sacral region: Secondary | ICD-10-CM | POA: Diagnosis not present

## 2023-09-09 DIAGNOSIS — M5451 Vertebrogenic low back pain: Secondary | ICD-10-CM | POA: Diagnosis not present

## 2023-09-09 DIAGNOSIS — M461 Sacroiliitis, not elsewhere classified: Secondary | ICD-10-CM | POA: Diagnosis not present

## 2023-10-04 ENCOUNTER — Ambulatory Visit: Admitting: Podiatry

## 2023-10-04 ENCOUNTER — Encounter: Payer: Self-pay | Admitting: Podiatry

## 2023-10-04 DIAGNOSIS — M79675 Pain in left toe(s): Secondary | ICD-10-CM

## 2023-10-04 DIAGNOSIS — B351 Tinea unguium: Secondary | ICD-10-CM

## 2023-10-04 DIAGNOSIS — M79674 Pain in right toe(s): Secondary | ICD-10-CM

## 2023-10-04 NOTE — Progress Notes (Signed)
 This patient presents to the office with chief complaint of long thick painful nails.  Patient says the nails are painful walking and wearing shoes.  This patient is unable to self treat.  This patient is unable to trim his  nails since he is unable to reach his nails.  he presents to the office for preventative foot care services. He presents to the office with his wife. Patient also relates callus in nail groove.  General Appearance  Alert, conversant and in no acute stress.  Vascular  Dorsalis pedis and posterior tibial  pulses are palpable  bilaterally.  Capillary return is within normal limits  bilaterally. Temperature is within normal limits  bilaterally.  Neurologic  Senn-Weinstein monofilament wire test within normal limits  bilaterally. Muscle power within normal limits bilaterally.  Nails Thick disfigured discolored nails with subungual debris  hallux nails bilaterally. No evidence of bacterial infection or drainage bilaterally.  Orthopedic  No limitations of motion  feet .  No crepitus or effusions noted.  IPJ hallux  B/L.  Skin  normotropic skin with no porokeratosis noted bilaterally.  No signs of infections or ulcers noted.     Onychomycosis  Nails  B/L.  Pain in right toes  Pain in left toes  Debridement of nails both feet followed trimming the nails with dremel tool.    RTC 6   months.   Ruffin Cotton DPM

## 2023-10-05 DIAGNOSIS — G20A1 Parkinson's disease without dyskinesia, without mention of fluctuations: Secondary | ICD-10-CM | POA: Diagnosis not present

## 2023-10-07 DIAGNOSIS — M5442 Lumbago with sciatica, left side: Secondary | ICD-10-CM | POA: Diagnosis not present

## 2023-10-07 DIAGNOSIS — M5451 Vertebrogenic low back pain: Secondary | ICD-10-CM | POA: Diagnosis not present

## 2023-10-07 DIAGNOSIS — G20C Parkinsonism, unspecified: Secondary | ICD-10-CM | POA: Diagnosis not present

## 2023-10-07 DIAGNOSIS — M9903 Segmental and somatic dysfunction of lumbar region: Secondary | ICD-10-CM | POA: Diagnosis not present

## 2023-10-13 ENCOUNTER — Encounter: Payer: Self-pay | Admitting: Family Medicine

## 2023-10-14 ENCOUNTER — Ambulatory Visit (INDEPENDENT_AMBULATORY_CARE_PROVIDER_SITE_OTHER): Admitting: Student in an Organized Health Care Education/Training Program

## 2023-10-14 ENCOUNTER — Ambulatory Visit (HOSPITAL_BASED_OUTPATIENT_CLINIC_OR_DEPARTMENT_OTHER)
Admission: RE | Admit: 2023-10-14 | Discharge: 2023-10-14 | Disposition: A | Discharge status: Home / Self Care | Source: Ambulatory Visit | Attending: Student in an Organized Health Care Education/Training Program | Admitting: Student in an Organized Health Care Education/Training Program

## 2023-10-14 ENCOUNTER — Encounter: Payer: Self-pay | Admitting: Student in an Organized Health Care Education/Training Program

## 2023-10-14 VITALS — BP 131/75 | HR 80 | Temp 98.0°F | Wt 179.0 lb

## 2023-10-14 DIAGNOSIS — R053 Chronic cough: Secondary | ICD-10-CM | POA: Insufficient documentation

## 2023-10-14 MED ORDER — AMOXICILLIN-POT CLAVULANATE 875-125 MG PO TABS
1.0000 | ORAL_TABLET | Freq: Two times a day (BID) | ORAL | 0 refills | Status: AC
Start: 2023-10-14 — End: 2023-10-21

## 2023-10-14 NOTE — Assessment & Plan Note (Signed)
 Productive cough for 6 weeks.  Started around [place] time after he returned from travel to [place], visiting his daughter.  Denies any fevers or chills.  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  Exam is pretty reassuring, there are some mild coarse crackles in the right midlung.  Differential diagnosis includes, things like chronic bronchitis or reflux.  He is mildly immunosuppressed because of advanced age, I think there is some risk for other infections including mycobacterial, psitacosis, cryptococcus or histoplasmosis.  Does not have much systemic symptoms to suggest brucellosis or Q fever.  Will get a chest x-ray today to evaluate for infiltrates.  We talked about treatment of chronic bronchitis typically being supportive.  Given the amount and purulence of sputum I think there is some risk of bacterial bronchitis so we will treat with Augmentin .  Recommended against prednisone  at this point given known reactive airway disease history.

## 2023-10-14 NOTE — Progress Notes (Signed)
 Acute Office Visit  Subjective:     Patient ID: Alan Ball, male    DOB: 1950/11/17, 73 y.o.   MRN: [id number]  Chief Complaint  Patient presents with   Cough    Patient states this has been a on and off situation. States this time it started 3 weeks ago. Cough,runny nose. Tried OTC medication but nothing is helping. Cough has seemed to worsen. No fevers. States he took 40mg  of steroids this am that was left over from previous visit with Dr.Greene.     HPI  73 year old person living with Parkinson's disease here for management of chronic coughing.  This has been a problem for the patient ever since around [place] time, little over 6 weeks ago.  He was recently on a trip to [place] to visit his daughter who is a Producer, television/film/video.  He says he has been able to shake this cough, been using it over-the-counter medications.  Thought that it was getting better but then over the last 4 days the cough has gotten worse.  It is productive of the large amount of green sputum especially in the morning time.  Denies any shortness of breath.  No fevers or chills.  Eating and drinking well.  No fatigue or malaise.  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  Reports otherwise his health has been good, no recent changes in his medications.  No ACE inhibitor's.  Reports his reflux has been stable.  Denies any aspiration events.      Objective:    BP 131/75   Pulse 80   Temp 98 F (36.7 C) (Oral)   Wt 179 lb (81.2 kg)   SpO2 97%   BMI 27.22 kg/m    Physical Exam  Gen: Well-appearing Ears: Bilateral tympanic membranes are normal Mouth: Posterior oropharynx is mildly inflamed Lungs: Unlabored, mild coarse crackles heard at the right midlung, otherwise clear, no wheezing Ext: Warm, no edema Skin: No rash      Assessment & Plan:   Problem List Items Addressed This Visit       Unprioritized   Chronic cough - Primary   Productive cough for 6  weeks.  Started around [place] time after he returned from travel to [place], visiting his daughter.  Denies any fevers or chills.  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  Exam is pretty reassuring, there are some mild coarse crackles in the right midlung.  Differential diagnosis includes, things like chronic bronchitis or reflux.  He is mildly immunosuppressed because of advanced age, I think there is some risk for other infections including mycobacterial, psitacosis, cryptococcus or histoplasmosis.  Does not have much systemic symptoms to suggest brucellosis or Q fever.  Will get a chest x-ray today to evaluate for infiltrates.  We talked about treatment of chronic bronchitis typically being supportive.  Given the amount and purulence of sputum I think there is some risk of bacterial bronchitis so we will treat with Augmentin .  Recommended against prednisone  at this point given known reactive airway disease history.      Relevant Medications   amoxicillin -clavulanate (AUGMENTIN ) 875-125 MG tablet   Other Relevant Orders   DG Chest 2 View    Meds ordered this encounter  Medications   amoxicillin -clavulanate (AUGMENTIN ) 875-125 MG tablet    Sig: Take 1 tablet by mouth 2 (two) times daily for 7 days.    Dispense:  14 tablet    Refill:  0    Return if symptoms worsen or fail to improve.  Ether Hercules, MD

## 2023-10-20 ENCOUNTER — Ambulatory Visit: Admitting: Family Medicine

## 2023-10-22 ENCOUNTER — Ambulatory Visit: Payer: Self-pay | Admitting: Family Medicine

## 2023-10-26 DIAGNOSIS — M9903 Segmental and somatic dysfunction of lumbar region: Secondary | ICD-10-CM | POA: Diagnosis not present

## 2023-10-26 DIAGNOSIS — M5451 Vertebrogenic low back pain: Secondary | ICD-10-CM | POA: Diagnosis not present

## 2023-10-26 DIAGNOSIS — G20C Parkinsonism, unspecified: Secondary | ICD-10-CM | POA: Diagnosis not present

## 2023-10-26 DIAGNOSIS — M5442 Lumbago with sciatica, left side: Secondary | ICD-10-CM | POA: Diagnosis not present

## 2023-11-18 DIAGNOSIS — M5451 Vertebrogenic low back pain: Secondary | ICD-10-CM | POA: Diagnosis not present

## 2023-11-18 DIAGNOSIS — M9903 Segmental and somatic dysfunction of lumbar region: Secondary | ICD-10-CM | POA: Diagnosis not present

## 2023-11-18 DIAGNOSIS — M5442 Lumbago with sciatica, left side: Secondary | ICD-10-CM | POA: Diagnosis not present

## 2023-11-18 DIAGNOSIS — G20C Parkinsonism, unspecified: Secondary | ICD-10-CM | POA: Diagnosis not present

## 2023-12-09 ENCOUNTER — Other Ambulatory Visit: Payer: Self-pay | Admitting: Family Medicine

## 2023-12-09 DIAGNOSIS — K219 Gastro-esophageal reflux disease without esophagitis: Secondary | ICD-10-CM

## 2023-12-22 ENCOUNTER — Other Ambulatory Visit: Payer: Self-pay | Admitting: Family Medicine

## 2023-12-22 DIAGNOSIS — E785 Hyperlipidemia, unspecified: Secondary | ICD-10-CM

## 2023-12-23 DIAGNOSIS — G20C Parkinsonism, unspecified: Secondary | ICD-10-CM | POA: Diagnosis not present

## 2023-12-23 DIAGNOSIS — M5451 Vertebrogenic low back pain: Secondary | ICD-10-CM | POA: Diagnosis not present

## 2023-12-23 DIAGNOSIS — M9903 Segmental and somatic dysfunction of lumbar region: Secondary | ICD-10-CM | POA: Diagnosis not present

## 2023-12-23 DIAGNOSIS — M5442 Lumbago with sciatica, left side: Secondary | ICD-10-CM | POA: Diagnosis not present

## 2024-01-11 DIAGNOSIS — L648 Other androgenic alopecia: Secondary | ICD-10-CM | POA: Diagnosis not present

## 2024-01-11 DIAGNOSIS — Z85828 Personal history of other malignant neoplasm of skin: Secondary | ICD-10-CM | POA: Diagnosis not present

## 2024-01-11 DIAGNOSIS — Z08 Encounter for follow-up examination after completed treatment for malignant neoplasm: Secondary | ICD-10-CM | POA: Diagnosis not present

## 2024-01-20 ENCOUNTER — Encounter: Admitting: Family Medicine

## 2024-01-20 DIAGNOSIS — M5451 Vertebrogenic low back pain: Secondary | ICD-10-CM | POA: Diagnosis not present

## 2024-01-20 DIAGNOSIS — G20C Parkinsonism, unspecified: Secondary | ICD-10-CM | POA: Diagnosis not present

## 2024-01-20 DIAGNOSIS — M9903 Segmental and somatic dysfunction of lumbar region: Secondary | ICD-10-CM | POA: Diagnosis not present

## 2024-01-20 DIAGNOSIS — M5442 Lumbago with sciatica, left side: Secondary | ICD-10-CM | POA: Diagnosis not present

## 2024-01-21 ENCOUNTER — Encounter: Payer: Self-pay | Admitting: Family Medicine

## 2024-01-21 ENCOUNTER — Ambulatory Visit (INDEPENDENT_AMBULATORY_CARE_PROVIDER_SITE_OTHER): Admitting: Family Medicine

## 2024-01-21 VITALS — BP 120/80 | HR 69 | Temp 97.7°F | Ht 68.0 in | Wt 180.2 lb

## 2024-01-21 DIAGNOSIS — I10 Essential (primary) hypertension: Secondary | ICD-10-CM | POA: Diagnosis not present

## 2024-01-21 DIAGNOSIS — E785 Hyperlipidemia, unspecified: Secondary | ICD-10-CM | POA: Diagnosis not present

## 2024-01-21 DIAGNOSIS — M5440 Lumbago with sciatica, unspecified side: Secondary | ICD-10-CM | POA: Diagnosis not present

## 2024-01-21 DIAGNOSIS — G8929 Other chronic pain: Secondary | ICD-10-CM

## 2024-01-21 DIAGNOSIS — Z23 Encounter for immunization: Secondary | ICD-10-CM | POA: Diagnosis not present

## 2024-01-21 DIAGNOSIS — K219 Gastro-esophageal reflux disease without esophagitis: Secondary | ICD-10-CM | POA: Diagnosis not present

## 2024-01-21 DIAGNOSIS — G20C Parkinsonism, unspecified: Secondary | ICD-10-CM | POA: Diagnosis not present

## 2024-01-21 DIAGNOSIS — R5383 Other fatigue: Secondary | ICD-10-CM

## 2024-01-21 DIAGNOSIS — Z0000 Encounter for general adult medical examination without abnormal findings: Secondary | ICD-10-CM

## 2024-01-21 LAB — CBC
HCT: 43.6 % (ref 39.0–52.0)
Hemoglobin: 14.3 g/dL (ref 13.0–17.0)
MCHC: 32.7 g/dL (ref 30.0–36.0)
MCV: 86.4 fl (ref 78.0–100.0)
Platelets: 300 K/uL (ref 150.0–400.0)
RBC: 5.05 Mil/uL (ref 4.22–5.81)
RDW: 13.8 % (ref 11.5–15.5)
WBC: 6.9 K/uL (ref 4.0–10.5)

## 2024-01-21 LAB — COMPREHENSIVE METABOLIC PANEL WITH GFR
ALT: 4 U/L (ref 0–53)
AST: 23 U/L (ref 0–37)
Albumin: 4.4 g/dL (ref 3.5–5.2)
Alkaline Phosphatase: 67 U/L (ref 39–117)
BUN: 18 mg/dL (ref 6–23)
CO2: 30 meq/L (ref 19–32)
Calcium: 9.4 mg/dL (ref 8.4–10.5)
Chloride: 106 meq/L (ref 96–112)
Creatinine, Ser: 0.92 mg/dL (ref 0.40–1.50)
GFR: 81.57 mL/min (ref >60.00)
Glucose, Bld: 90 mg/dL (ref 70–99)
Potassium: 4.3 meq/L (ref 3.5–5.1)
Sodium: 141 meq/L (ref 135–145)
Total Bilirubin: 0.9 mg/dL (ref 0.2–1.2)
Total Protein: 6.9 g/dL (ref 6.0–8.3)

## 2024-01-21 LAB — LIPID PANEL
Cholesterol: 160 mg/dL (ref 0–200)
HDL: 56.7 mg/dL (ref >39.00)
LDL Cholesterol: 91 mg/dL (ref 0–99)
NonHDL: 103.33
Total CHOL/HDL Ratio: 3
Triglycerides: 64 mg/dL (ref 0.0–149.0)
VLDL: 12.8 mg/dL (ref 0.0–40.0)

## 2024-01-21 LAB — TSH: TSH: 1.75 u[IU]/mL (ref 0.35–5.50)

## 2024-01-21 NOTE — Progress Notes (Signed)
 Subjective:  Patient ID: Alan Ball, male    DOB: 09-28-1950  Age: 73 y.o. MRN: [id number]  CC:  Chief Complaint  Patient presents with   Annual Exam    Physical; haven't been feel well lately; walking less; sleeping more; shaking more; parkisons moving to right side; want to discuss a referral to new Neurologist, old one is no longer in practice   Back Pain    Sciatica; only doing chiropractic but want to discuss a specialty doctor to help    HPI Alan Ball presents for Annual Exam and other concerns above.   PCP, me Neurology, Dr. Corlis prior, Twana Seip PA at movement disorders clinic, [facility]. Gastroenterology, Dr. Shila Ortho/spine, Dr. Burnetta Podiatry, Dr. Tobie Urology - Dr. Watt - on Cialis for BPH - monitoring PSA - appt next month for PSA that had increased slightly.   Parkinson's disease. Followed by Twana Seip, PA at [facility]. Unsure of primary neurologist  Last visit 10/05/23 - per that note  tremor medically refractory.  no difference with amantadine - off for past 2 months. Consideration of trying dopamine agonist once coming off artane/amantadine. Surgical options discussed - deferred at this time. Off amantadine, still on sinemet  CR 25/100 2 tabs tid, and artane 2mg  BID.  Would like to see new neurologist - prefers local.   Back Pain: Some chronic back pain, sciatica past 2 years, left leg at times numb,  pain in leg with walking. Warm pain in low back, radiating pain into left leg. Symptoms for years.  Has seen Dr. Burnetta for cervical issues in past, but not for back.  Under care of chiropractor on and off. Some relief with chiro treatments, then symptoms return.  Last XR of lumbar spine in 2016: IMPRESSION: Osteoarthritic change at multiple levels, most marked at L4-5 and L5-S1. Slight spondylolisthesis at L4-5. No fracture.   Fatigue: Noted past 6 months. No dark stools/BRBPR.  No chest pain, palpitations, no dyspnea.   Gets tired with activity. Favoring back.  Hyperlipidemia treated with Lipitor 10 mg daily GERD, seen by GI previously, treated with omeprazole .  EGD and colonoscopy in November 2023.  PPI daily.effective - heartburn controlled.  Amlodipine  5 mg daily for hypertension. No recent need for albuterol .   Lab Results  Component Value Date   ALT 8 07/14/2023   AST 24 07/14/2023   ALKPHOS 80 07/14/2023   BILITOT 0.7 07/14/2023   Lab Results  Component Value Date   CREATININE 0.98 07/14/2023   BP Readings from Last 3 Encounters:  01/21/24 120/80  10/14/23 131/75  07/14/23 124/68   Lab Results  Component Value Date   CHOL 172 07/14/2023   CHOL 143 01/13/2023   CHOL 161 01/16/2022   Lab Results  Component Value Date   HDL 68.00 07/14/2023   HDL 60.10 01/13/2023   HDL 54.10 01/16/2022        01/21/2024   11:44 AM 07/14/2023   10:53 AM 05/14/2023   10:59 AM 01/13/2023   11:41 AM 12/03/2022   10:10 AM  Depression screen PHQ 2/9  Decreased Interest 1 0 0 0 0  Down, Depressed, Hopeless 0 0 0 0 0  PHQ - 2 Score 1 0 0 0 0  Altered sleeping 0 0 0 0 0  Tired, decreased energy 1 0 0 0 0  Change in appetite 0 0 0 0 0  Feeling bad or failure about yourself  0 0 0 0 0  Trouble concentrating 0 0 0 0 0  Moving slowly or fidgety/restless 3 0 0 0 0  Suicidal thoughts 0 0 0 0 0  PHQ-9 Score 5 0 0 0 0  Difficult doing work/chores Somewhat difficult    Not difficult at all    Health Maintenance  Topic Date Due   COVID-19 Vaccine (1) Never done   DTaP/Tdap/Td (2 - Td or Tdap) 09/09/2022   Influenza Vaccine  12/03/2023   Medicare Annual Wellness (AWV)  12/03/2023   Colonoscopy  03/18/2025   Pneumococcal Vaccine: 50+ Years  Completed   Hepatitis C Screening  Completed   Zoster Vaccines- Shingrix   Completed   HPV VACCINES  Aged Out   Meningococcal B Vaccine  Aged Out  Colonoscopy - 2023 - repeat in 3 years. Prostate: followed by urology as above.   Immunization History   Administered Date(s) Administered   Fluad Quad(high Dose 65+) 02/13/2019, 04/01/2020, 01/16/2022   Fluad Trivalent(High Dose 65+) 01/13/2023   INFLUENZA, HIGH DOSE SEASONAL PF 02/17/2018   Influenza,inj,Quad PF,6+ Mos 12/25/2013, 05/11/2016, 04/19/2017   Influenza,inj,quad, With Preservative 02/06/2019   Pneumococcal Conjugate-13 12/25/2013   Pneumococcal Polysaccharide-23 03/16/2014   Tdap 09/08/2012   Zoster Recombinant(Shingrix ) 04/10/2019, 07/27/2019  Flu vaccine - today.  Prevnar 20 today.  Covid booster - declines.   No results found. Had cataract surgery - yearly visits.   Dental: appt next month  Alcohol: none.   Tobacco: none  Exercise: limited by back, other above.    History Patient Active Problem List   Diagnosis Date Noted   Chronic cough 10/14/2023   Mass of soft tissue of left buttock 07/29/2020   Umbilical hernia 07/29/2020   Gastritis 07/29/2020   Parkinsonism (HCC) 10/11/2019   Cervical radiculopathy 02/21/2019   DDD (degenerative disc disease), cervical 02/21/2019   Bilateral carpal tunnel syndrome 05/19/2018   Trigger middle finger of right hand 12/20/2017   De Quervain's tenosynovitis, right 12/20/2017   Carpal tunnel syndrome of right wrist 12/20/2017   Renal cyst, right 09/05/2015   Inguinal hernia 10/04/2012   Past Medical History:  Diagnosis Date   Allergy    Asthma    Hyperlipemia    Hypertension    Melanoma (HCC) 02/2022   on left side of nose   Tremor    Past Surgical History:  Procedure Laterality Date   CARPAL TUNNEL RELEASE Bilateral 2019, 2020   CATARACT EXTRACTION BILATERAL W/ ANTERIOR VITRECTOMY Bilateral    cateracts     HERNIA REPAIR Bilateral    inguinal   SHOULDER SURGERY     No Known Allergies Prior to Admission medications   Medication Sig Start Date End Date Taking? Authorizing Provider  albuterol  (VENTOLIN  HFA) 108 (90 Base) MCG/ACT inhaler Inhale 1-2 puffs into the lungs every 4 (four) hours as needed for  wheezing or shortness of breath. 01/13/23  Yes Levora Reyes SAUNDERS, MD  amLODipine  (NORVASC ) 5 MG tablet Take 1 tablet (5 mg total) by mouth daily. 01/13/23  Yes Levora Reyes SAUNDERS, MD  atorvastatin  (LIPITOR) 10 MG tablet TAKE ONE TABLET BY MOUTH ONCE A DAY 12/22/23  Yes Levora Reyes SAUNDERS, MD  carbidopa -levodopa  (SINEMET  IR) 25-100 MG tablet Take 1 tablet by mouth 3 (three) times daily. 01/31/19  Yes [provider]  fluticasone (CUTIVATE) 0.05 % cream Apply topically 2 (two) times daily as needed. 02/13/22  Yes [provider]  ibuprofen (ADVIL,MOTRIN) 200 MG tablet Take 200 mg by mouth every 6 (six) hours as needed for pain.  Yes [provider]  Multiple Vitamin (MULTIVITAMIN) tablet Take 1 tablet by mouth daily.   Yes [provider]  omeprazole  (PRILOSEC) 20 MG capsule TAKE ONE CAPSULE BY MOUTH ONCE A DAY 12/09/23  Yes Levora Reyes SAUNDERS, MD  sildenafil (VIAGRA) 50 MG tablet Take 1 tablet by mouth daily as needed.   Yes [provider]  vitamin C (ASCORBIC ACID) 500 MG tablet Take 500 mg by mouth daily.   Yes [provider]   Social History   Socioeconomic History   Marital status: Married    Spouse name: Cy   Number of children: Not on file   Years of education: Not on file   Highest education level: Bachelor's degree (e.g., BA, AB, BS)  Occupational History    Comment: retired  Tobacco Use   Smoking status: Never    Passive exposure: Never   Smokeless tobacco: Never  Vaping Use   Vaping status: Never Used  Substance and Sexual Activity   Alcohol use: Yes    Comment: socially, seldom.   Drug use: Never   Sexual activity: Not Currently  Other Topics Concern   Not on file  Social History Narrative   Lives with wife   Caffeine- coffee 3 cups daily, maybe a soda   Social Drivers of Health   Financial Resource Strain: Low Risk  (12/03/2022)   Overall Financial Resource Strain (CARDIA)    Difficulty of Paying Living Expenses: Not  hard at all  Food Insecurity: No Food Insecurity (12/03/2022)   Hunger Vital Sign    Worried About Running Out of Food in the Last Year: Never true    Ran Out of Food in the Last Year: Never true  Transportation Needs: No Transportation Needs (12/03/2022)   PRAPARE - Administrator, Civil Service (Medical): No    Lack of Transportation (Non-Medical): No  Physical Activity: Sufficiently Active (11/26/2021)   Exercise Vital Sign    Days of Exercise per Week: 5 days    Minutes of Exercise per Session: 30 min  Stress: No Stress Concern Present (12/03/2022)   [employer] of Occupational Health - Occupational Stress Questionnaire    Feeling of Stress : Not at all  Social Connections: Moderately Integrated (12/03/2022)   Social Connection and Isolation Panel    Frequency of Communication with Friends and Family: More than three times a week    Frequency of Social Gatherings with Friends and Family: Three times a week    Attends Religious Services: More than 4 times per year    Active Member of Clubs or Organizations: No    Attends Banker Meetings: Never    Marital Status: Married  Catering manager Violence: Not At Risk (12/03/2022)   Humiliation, Afraid, Rape, and Kick questionnaire    Fear of Current or Ex-Partner: No    Emotionally Abused: No    Physically Abused: No    Sexually Abused: No    Review of Systems  13 point review of systems per patient health survey noted.  Negative other than as indicated above or in HPI.   Objective:   Vitals:   01/21/24 1146  BP: 120/80  Pulse: 69  Temp: 97.7 F (36.5 C)  SpO2: 97%  Weight: 180 lb 3.2 oz (81.7 kg)  Height: 5' 8 (1.727 m)     Physical Exam Vitals reviewed.  Constitutional:      Appearance: He is well-developed.  HENT:     Head: Normocephalic and atraumatic.  Right Ear: External ear normal.     Left Ear: External ear normal.  Eyes:     Conjunctiva/sclera: Conjunctivae normal.     Pupils:  Pupils are equal, round, and reactive to light.  Neck:     Thyroid : No thyromegaly.  Cardiovascular:     Rate and Rhythm: Normal rate and regular rhythm.     Heart sounds: Normal heart sounds.  Pulmonary:     Effort: Pulmonary effort is normal. No respiratory distress.     Breath sounds: Normal breath sounds. No wheezing.  Abdominal:     General: There is no distension.     Palpations: Abdomen is soft.     Tenderness: There is no abdominal tenderness.  Musculoskeletal:        General: No tenderness. Normal range of motion.     Cervical back: Normal range of motion and neck supple.  Lymphadenopathy:     Cervical: No cervical adenopathy.  Skin:    General: Skin is warm and dry.  Neurological:     Mental Status: He is alert and oriented to person, place, and time.     Motor: Tremor present.     Deep Tendon Reflexes: Reflexes are normal and symmetric.  Psychiatric:        Behavior: Behavior normal.        Assessment & Plan:  Alan Ball is a 73 y.o. male . Annual physical exam  -anticipatory guidance as below in AVS, screening labs above. Health maintenance items as above in HPI discussed/recommended as applicable.   Parkinsonism, unspecified Parkinsonism type (HCC) - Plan: Ambulatory referral to Neurology  - Currently followed by neurology as above.  Has tapered off some medication as above.  He would like to have second opinion locally, referral placed to Dr. Evonnie at [facility] neuro.  No med changes for now.  Chronic low back pain with sciatica, sciatica laterality unspecified, unspecified back pain laterality - Plan: Ambulatory referral to Spine Surgery  - Persistent sciatica, denies any recent trauma, acute changes.  Reassuring negative seated straight leg raise in office.  Will refer to spine specialist, seen previously for cervical disease.  Fatigue, unspecified type - Plan: CBC, Comprehensive metabolic panel with GFR, TSH  - Possibly multifactorial including with  Parkinsons as above.  Check labs and plan on follow-up to discuss other contributors.  Essential hypertension  - Stable on current regimen, no changes for now.  Hyperlipidemia, unspecified hyperlipidemia type - Plan: Comprehensive metabolic panel with GFR, Lipid panel  - Check labs and adjust plan accordingly.  Gastroesophageal reflux disease, unspecified whether esophagitis present  - Continue PPI, stable. Needs flu shot - Plan: Flu vaccine HIGH DOSE PF(Fluzone Trivalent)  Need for pneumococcal vaccination - Plan: Pneumococcal conjugate vaccine 20-valent (Prevnar 20)   No orders of the defined types were placed in this encounter.  Patient Instructions  Thanks for coming in today.  Tetanus vaccine can be updated at your pharmacy.  Flu vaccine and pneumonia vaccines were given today.  I will check some labs for fatigue but I would like to discuss that a little further at next visit.  As we discussed that can be from multiple possible causes, certainly could be related to Parkinson's as well as your back issues.  For the Parkinson's I did send a referral to a different neurologist locally for a second opinion.  No change in meds for now.  I referred you to Dr. Burnetta to follow-up on the back and sciatica symptoms.  No  change in other medications at this time.  If any concerns on labs I will let you know.  See information below and fatigue and if anything acutely changes please be seen sooner.  Hang in there.  Fatigue If you have fatigue, you feel tired all the time and have a lack of energy or a lack of motivation. Fatigue may make it difficult to start or complete tasks because of exhaustion. Occasional or mild fatigue is often a normal response to activity or life. However, long-term (chronic) or extreme fatigue may be a symptom of a medical condition such as: Depression. Not having enough red blood cells or hemoglobin in the blood (anemia). A problem with a small gland located in the lower  front part of the neck (thyroid  disorder). Rheumatologic conditions. These are problems related to the body's defense system (immune system). Infections, especially certain viral infections. Fatigue can also lead to negative health outcomes over time. Follow these instructions at home: Medicines Take over-the-counter and prescription medicines only as told by your health care provider. Take a multivitamin if told by your health care provider. Do not use herbal or dietary supplements unless they are approved by your health care provider. Eating and drinking  Avoid heavy meals in the evening. Eat a well-balanced diet, which includes lean proteins, whole grains, plenty of fruits and vegetables, and low-fat dairy products. Avoid eating or drinking too many products with caffeine in them. Avoid alcohol. Drink enough fluid to keep your urine pale yellow. Activity  Exercise regularly, as told by your health care provider. Use or practice techniques to help you relax, such as yoga, tai chi, meditation, or massage therapy. Lifestyle Change situations that cause you stress. Try to keep your work and personal schedules in balance. Do not use recreational or illegal drugs. General instructions Monitor your fatigue for any changes. Go to bed and get up at the same time every day. Avoid fatigue by pacing yourself during the day and getting enough sleep at night. Maintain a healthy weight. Contact a health care provider if: Your fatigue does not get better. You have a fever. You suddenly lose or gain weight. You have headaches. You have trouble falling asleep or sleeping through the night. You feel angry, guilty, anxious, or sad. You have swelling in your legs or another part of your body. Get help right away if: You feel confused, feel like you might faint, or faint. Your vision is blurry or you have a severe headache. You have severe pain in your abdomen, your back, or the area between your  waist and hips (pelvis). You have chest pain, shortness of breath, or an irregular or fast heartbeat. You are unable to urinate, or you urinate less than normal. You have abnormal bleeding from the rectum, nose, lungs, nipples, or, if you are male, the vagina. You vomit blood. You have thoughts about hurting yourself or others. These symptoms may be an emergency. Get help right away. Call 911. Do not wait to see if the symptoms will go away. Do not drive yourself to the hospital. Get help right away if you feel like you may hurt yourself or others, or have thoughts about taking your own life. Go to your nearest emergency room or: Call 911. Call the National Suicide Prevention Lifeline at (239)185-9813 or 988. This is open 24 hours a day. Text the Crisis Text Line at 340-672-6886. Summary If you have fatigue, you feel tired all the time and have a lack of energy or  a lack of motivation. Fatigue may make it difficult to start or complete tasks because of exhaustion. Long-term (chronic) or extreme fatigue may be a symptom of a medical condition. Exercise regularly, as told by your health care provider. Change situations that cause you stress. Try to keep your work and personal schedules in balance. This information is not intended to replace advice given to you by your health care provider. Make sure you discuss any questions you have with your health care provider. Document Revised: 02/10/2021 Document Reviewed: 02/10/2021 Elsevier Patient Education  2024 Elsevier Inc.  Sciatica  Sciatica is pain, weakness, tingling, or loss of feeling (numbness) along the sciatic nerve. The sciatic nerve starts in the lower back and goes down the back of each leg. Sciatica usually affects one side of the body. Sciatica usually goes away on its own or with treatment. Sometimes, sciatica may come back. What are the causes? This condition happens when the sciatic nerve is pinched or has pressure put on it. This  may be caused by: A disk in between the bones of the spine bulging out too far (herniated disk). Changes in the spinal disks due to aging. A condition that affects a muscle in the butt. Extra bone growth near the sciatic nerve. A break (fracture) of the area between your hip bones (pelvis). Pregnancy. Tumor. This is rare. What increases the risk? You are more likely to develop this condition if you: Play sports that put pressure or stress on the spine. Have poor strength and ease of movement (flexibility). Have had a back injury or back surgery. Sit for long periods of time. Do activities that involve bending or lifting over and over again. Are very overweight (obese). What are the signs or symptoms? Symptoms can vary from mild to very bad. They may include: Any of these problems in the lower back, leg, hip, or butt: Mild tingling, loss of feeling, or dull aches. A burning feeling. Sharp pains. Loss of feeling in the back of the calf or the sole of the foot. Leg weakness. Very bad back pain that makes it hard to move. These symptoms may get worse when you cough, sneeze, or laugh. They may also get worse when you sit or stand for long periods of time. How is this treated? This condition often gets better without any treatment. However, treatment may include: Changing or cutting back on physical activity when you have pain. Exercising, including strengthening and stretching. Putting ice or heat on the affected area. Shots of medicines to relieve pain and swelling or to relax your muscles. Surgery. Follow these instructions at home: Medicines Take over-the-counter and prescription medicines only as told by your doctor. Ask your doctor if you should avoid driving or using machines while you are taking your medicine. Managing pain     If told, put ice on the affected area. To do this: Put ice in a plastic bag. Place a towel between your skin and the bag. Leave the ice on for 20  minutes, 2-3 times a day. If your skin turns bright red, take off the ice right away to prevent skin damage. The risk of skin damage is higher if you cannot feel pain, heat, or cold. If told, put heat on the affected area. Do this as often as told by your doctor. Use the heat source that your doctor tells you to use, such as a moist heat pack or a heating pad. Place a towel between your skin and the heat source.  Leave the heat on for 20-30 minutes. If your skin turns bright red, take off the heat right away to prevent burns. The risk of burns is higher if you cannot feel pain, heat, or cold. Activity  Return to your normal activities when your doctor says that it is safe. Avoid activities that make your symptoms worse. Take short rests during the day. When you rest for a long time, do some physical activity or stretching between periods of rest. Avoid sitting for a long time without moving. Get up and move around at least one time each hour. Do exercises and stretches as told by your doctor. Do not lift anything that is heavier than 10 lb (4.5 kg). Avoid lifting heavy things even when you do not have symptoms. Avoid lifting heavy things over and over. When you lift objects, always lift in a way that is safe for your body. To do this, you should: Bend your knees. Keep the object close to your body. Avoid twisting. General instructions Stay at a healthy weight. Wear comfortable shoes that support your feet. Avoid wearing high heels. Avoid sleeping on a mattress that is too soft or too hard. You might have less pain if you sleep on a mattress that is firm enough to support your back. Contact a doctor if: Your pain is not controlled by medicine. Your pain does not get better. Your pain gets worse. Your pain lasts longer than 4 weeks. You lose weight without trying. Get help right away if: You cannot control when you pee (urinate) or poop (have a bowel movement). You have weakness in any  of these areas and it gets worse: Lower back. The area between your hip bones. Butt. Legs. You have redness or swelling of your back. You have a burning feeling when you pee. Summary Sciatica is pain, weakness, tingling, or loss of feeling (numbness) along the sciatic nerve. This may include the lower back, legs, hips, and butt. This condition happens when the sciatic nerve is pinched or has pressure put on it. Treatment often includes rest, exercise, medicines, and putting ice or heat on the affected area. This information is not intended to replace advice given to you by your health care provider. Make sure you discuss any questions you have with your health care provider. Document Revised: 07/28/2021 Document Reviewed: 07/28/2021 Elsevier Patient Education  2024 [employer].  Preventive Care 65 Years and Older, Male Preventive care refers to lifestyle choices and visits with your health care provider that can promote health and wellness. Preventive care visits are also called wellness exams. What can I expect for my preventive care visit? Counseling During your preventive care visit, your health care provider may ask about your: Medical history, including: Past medical problems. Family medical history. History of falls. Current health, including: Emotional well-being. Home life and relationship well-being. Sexual activity. Memory and ability to understand (cognition). Lifestyle, including: Alcohol, nicotine or tobacco, and drug use. Access to firearms. Diet, exercise, and sleep habits. Work and work Astronomer. Sunscreen use. Safety issues such as seatbelt and bike helmet use. Physical exam Your health care provider will check your: Height and weight. These may be used to calculate your BMI (body mass index). BMI is a measurement that tells if you are at a healthy weight. Waist circumference. This measures the distance around your waistline. This measurement also tells if  you are at a healthy weight and may help predict your risk of certain diseases, such as type 2 diabetes and high  blood pressure. Heart rate and blood pressure. Body temperature. Skin for abnormal spots. What immunizations do I need?  Vaccines are usually given at various ages, according to a schedule. Your health care provider will recommend vaccines for you based on your age, medical history, and lifestyle or other factors, such as travel or where you work. What tests do I need? Screening Your health care provider may recommend screening tests for certain conditions. This may include: Lipid and cholesterol levels. Diabetes screening. This is done by checking your blood sugar (glucose) after you have not eaten for a while (fasting). Hepatitis C test. Hepatitis B test. HIV (human immunodeficiency virus) test. STI (sexually transmitted infection) testing, if you are at risk. Lung cancer screening. Colorectal cancer screening. Prostate cancer screening. Abdominal aortic aneurysm (AAA) screening. You may need this if you are a current or former smoker. Talk with your health care provider about your test results, treatment options, and if necessary, the need for more tests. Follow these instructions at home: Eating and drinking  Eat a diet that includes fresh fruits and vegetables, whole grains, lean protein, and low-fat dairy products. Limit your intake of foods with high amounts of sugar, saturated fats, and salt. Take vitamin and mineral supplements as recommended by your health care provider. Do not drink alcohol if your health care provider tells you not to drink. If you drink alcohol: Limit how much you have to 0-2 drinks a day. Know how much alcohol is in your drink. In the [place] one drink equals one 12 oz bottle of beer (355 mL), one 5 oz glass of wine (148 mL), or one 1 oz glass of hard liquor (44 mL). Lifestyle Brush your teeth every morning and night with fluoride  toothpaste.  Floss one time each day. Exercise for at least 30 minutes 5 or more days each week. Do not use any products that contain nicotine or tobacco. These products include cigarettes, chewing tobacco, and vaping devices, such as e-cigarettes. If you need help quitting, ask your health care provider. Do not use drugs. If you are sexually active, practice safe sex. Use a condom or other form of protection to prevent STIs. Take aspirin only as told by your health care provider. Make sure that you understand how much to take and what form to take. Work with your health care provider to find out whether it is safe and beneficial for you to take aspirin daily. Ask your health care provider if you need to take a cholesterol-lowering medicine (statin). Find healthy ways to manage stress, such as: Meditation, yoga, or listening to music. Journaling. Talking to a trusted person. Spending time with friends and family. Safety Always wear your seat belt while driving or riding in a vehicle. Do not drive: If you have been drinking alcohol. Do not ride with someone who has been drinking. When you are tired or distracted. While texting. If you have been using any mind-altering substances or drugs. Wear a helmet and other protective equipment during sports activities. If you have firearms in your house, make sure you follow all gun safety procedures. Minimize exposure to UV radiation to reduce your risk of skin cancer. What's next? Visit your health care provider once a year for an annual wellness visit. Ask your health care provider how often you should have your eyes and teeth checked. Stay up to date on all vaccines. This information is not intended to replace advice given to you by your health care provider. Make sure you discuss  any questions you have with your health care provider. Document Revised: 10/16/2020 Document Reviewed: 10/16/2020 Elsevier Patient Education  2024 [employer].      Signed,    Reyes Pines, MD [facility], [facility] 01/21/24 12:37 PM

## 2024-01-21 NOTE — Patient Instructions (Signed)
 Thanks for coming in today.  Tetanus vaccine can be updated at your pharmacy.  Flu vaccine and pneumonia vaccines were given today.  I will check some labs for fatigue but I would like to discuss that a little further at next visit.  As we discussed that can be from multiple possible causes, certainly could be related to Parkinson's as well as your back issues.  For the Parkinson's I did send a referral to a different neurologist locally for a second opinion.  No change in meds for now.  I referred you to Dr. Burnetta to follow-up on the back and sciatica symptoms.  No change in other medications at this time.  If any concerns on labs I will let you know.  See information below and fatigue and if anything acutely changes please be seen sooner.  Hang in there.  Fatigue If you have fatigue, you feel tired all the time and have a lack of energy or a lack of motivation. Fatigue may make it difficult to start or complete tasks because of exhaustion. Occasional or mild fatigue is often a normal response to activity or life. However, long-term (chronic) or extreme fatigue may be a symptom of a medical condition such as: Depression. Not having enough red blood cells or hemoglobin in the blood (anemia). A problem with a small gland located in the lower front part of the neck (thyroid  disorder). Rheumatologic conditions. These are problems related to the body's defense system (immune system). Infections, especially certain viral infections. Fatigue can also lead to negative health outcomes over time. Follow these instructions at home: Medicines Take over-the-counter and prescription medicines only as told by your health care provider. Take a multivitamin if told by your health care provider. Do not use herbal or dietary supplements unless they are approved by your health care provider. Eating and drinking  Avoid heavy meals in the evening. Eat a well-balanced diet, which includes lean proteins, whole grains,  plenty of fruits and vegetables, and low-fat dairy products. Avoid eating or drinking too many products with caffeine in them. Avoid alcohol. Drink enough fluid to keep your urine pale yellow. Activity  Exercise regularly, as told by your health care provider. Use or practice techniques to help you relax, such as yoga, tai chi, meditation, or massage therapy. Lifestyle Change situations that cause you stress. Try to keep your work and personal schedules in balance. Do not use recreational or illegal drugs. General instructions Monitor your fatigue for any changes. Go to bed and get up at the same time every day. Avoid fatigue by pacing yourself during the day and getting enough sleep at night. Maintain a healthy weight. Contact a health care provider if: Your fatigue does not get better. You have a fever. You suddenly lose or gain weight. You have headaches. You have trouble falling asleep or sleeping through the night. You feel angry, guilty, anxious, or sad. You have swelling in your legs or another part of your body. Get help right away if: You feel confused, feel like you might faint, or faint. Your vision is blurry or you have a severe headache. You have severe pain in your abdomen, your back, or the area between your waist and hips (pelvis). You have chest pain, shortness of breath, or an irregular or fast heartbeat. You are unable to urinate, or you urinate less than normal. You have abnormal bleeding from the rectum, nose, lungs, nipples, or, if you are male, the vagina. You vomit blood. You have thoughts  about hurting yourself or others. These symptoms may be an emergency. Get help right away. Call 911. Do not wait to see if the symptoms will go away. Do not drive yourself to the hospital. Get help right away if you feel like you may hurt yourself or others, or have thoughts about taking your own life. Go to your nearest emergency room or: Call 911. Call the National  Suicide Prevention Lifeline at (830) 491-0128 or 988. This is open 24 hours a day. Text the Crisis Text Line at 628-504-8493. Summary If you have fatigue, you feel tired all the time and have a lack of energy or a lack of motivation. Fatigue may make it difficult to start or complete tasks because of exhaustion. Long-term (chronic) or extreme fatigue may be a symptom of a medical condition. Exercise regularly, as told by your health care provider. Change situations that cause you stress. Try to keep your work and personal schedules in balance. This information is not intended to replace advice given to you by your health care provider. Make sure you discuss any questions you have with your health care provider. Document Revised: 02/10/2021 Document Reviewed: 02/10/2021 Elsevier Patient Education  2024 [employer] Inc.  Sciatica  Sciatica is pain, weakness, tingling, or loss of feeling (numbness) along the sciatic nerve. The sciatic nerve starts in the lower back and goes down the back of each leg. Sciatica usually affects one side of the body. Sciatica usually goes away on its own or with treatment. Sometimes, sciatica may come back. What are the causes? This condition happens when the sciatic nerve is pinched or has pressure put on it. This may be caused by: A disk in between the bones of the spine bulging out too far (herniated disk). Changes in the spinal disks due to aging. A condition that affects a muscle in the butt. Extra bone growth near the sciatic nerve. A break (fracture) of the area between your hip bones (pelvis). Pregnancy. Tumor. This is rare. What increases the risk? You are more likely to develop this condition if you: Play sports that put pressure or stress on the spine. Have poor strength and ease of movement (flexibility). Have had a back injury or back surgery. Sit for long periods of time. Do activities that involve bending or lifting over and over again. Are very  overweight (obese). What are the signs or symptoms? Symptoms can vary from mild to very bad. They may include: Any of these problems in the lower back, leg, hip, or butt: Mild tingling, loss of feeling, or dull aches. A burning feeling. Sharp pains. Loss of feeling in the back of the calf or the sole of the foot. Leg weakness. Very bad back pain that makes it hard to move. These symptoms may get worse when you cough, sneeze, or laugh. They may also get worse when you sit or stand for long periods of time. How is this treated? This condition often gets better without any treatment. However, treatment may include: Changing or cutting back on physical activity when you have pain. Exercising, including strengthening and stretching. Putting ice or heat on the affected area. Shots of medicines to relieve pain and swelling or to relax your muscles. Surgery. Follow these instructions at home: Medicines Take over-the-counter and prescription medicines only as told by your doctor. Ask your doctor if you should avoid driving or using machines while you are taking your medicine. Managing pain     If told, put ice on the affected area.  To do this: Put ice in a plastic bag. Place a towel between your skin and the bag. Leave the ice on for 20 minutes, 2-3 times a day. If your skin turns bright red, take off the ice right away to prevent skin damage. The risk of skin damage is higher if you cannot feel pain, heat, or cold. If told, put heat on the affected area. Do this as often as told by your doctor. Use the heat source that your doctor tells you to use, such as a moist heat pack or a heating pad. Place a towel between your skin and the heat source. Leave the heat on for 20-30 minutes. If your skin turns bright red, take off the heat right away to prevent burns. The risk of burns is higher if you cannot feel pain, heat, or cold. Activity  Return to your normal activities when your doctor says  that it is safe. Avoid activities that make your symptoms worse. Take short rests during the day. When you rest for a long time, do some physical activity or stretching between periods of rest. Avoid sitting for a long time without moving. Get up and move around at least one time each hour. Do exercises and stretches as told by your doctor. Do not lift anything that is heavier than 10 lb (4.5 kg). Avoid lifting heavy things even when you do not have symptoms. Avoid lifting heavy things over and over. When you lift objects, always lift in a way that is safe for your body. To do this, you should: Bend your knees. Keep the object close to your body. Avoid twisting. General instructions Stay at a healthy weight. Wear comfortable shoes that support your feet. Avoid wearing high heels. Avoid sleeping on a mattress that is too soft or too hard. You might have less pain if you sleep on a mattress that is firm enough to support your back. Contact a doctor if: Your pain is not controlled by medicine. Your pain does not get better. Your pain gets worse. Your pain lasts longer than 4 weeks. You lose weight without trying. Get help right away if: You cannot control when you pee (urinate) or poop (have a bowel movement). You have weakness in any of these areas and it gets worse: Lower back. The area between your hip bones. Butt. Legs. You have redness or swelling of your back. You have a burning feeling when you pee. Summary Sciatica is pain, weakness, tingling, or loss of feeling (numbness) along the sciatic nerve. This may include the lower back, legs, hips, and butt. This condition happens when the sciatic nerve is pinched or has pressure put on it. Treatment often includes rest, exercise, medicines, and putting ice or heat on the affected area. This information is not intended to replace advice given to you by your health care provider. Make sure you discuss any questions you have with your  health care provider. Document Revised: 07/28/2021 Document Reviewed: 07/28/2021 Elsevier Patient Education  2024 [employer].  Preventive Care 65 Years and Older, Male Preventive care refers to lifestyle choices and visits with your health care provider that can promote health and wellness. Preventive care visits are also called wellness exams. What can I expect for my preventive care visit? Counseling During your preventive care visit, your health care provider may ask about your: Medical history, including: Past medical problems. Family medical history. History of falls. Current health, including: Emotional well-being. Home life and relationship well-being. Sexual activity. Memory and  ability to understand (cognition). Lifestyle, including: Alcohol, nicotine or tobacco, and drug use. Access to firearms. Diet, exercise, and sleep habits. Work and work Astronomer. Sunscreen use. Safety issues such as seatbelt and bike helmet use. Physical exam Your health care provider will check your: Height and weight. These may be used to calculate your BMI (body mass index). BMI is a measurement that tells if you are at a healthy weight. Waist circumference. This measures the distance around your waistline. This measurement also tells if you are at a healthy weight and may help predict your risk of certain diseases, such as type 2 diabetes and high blood pressure. Heart rate and blood pressure. Body temperature. Skin for abnormal spots. What immunizations do I need?  Vaccines are usually given at various ages, according to a schedule. Your health care provider will recommend vaccines for you based on your age, medical history, and lifestyle or other factors, such as travel or where you work. What tests do I need? Screening Your health care provider may recommend screening tests for certain conditions. This may include: Lipid and cholesterol levels. Diabetes screening. This is done by  checking your blood sugar (glucose) after you have not eaten for a while (fasting). Hepatitis C test. Hepatitis B test. HIV (human immunodeficiency virus) test. STI (sexually transmitted infection) testing, if you are at risk. Lung cancer screening. Colorectal cancer screening. Prostate cancer screening. Abdominal aortic aneurysm (AAA) screening. You may need this if you are a current or former smoker. Talk with your health care provider about your test results, treatment options, and if necessary, the need for more tests. Follow these instructions at home: Eating and drinking  Eat a diet that includes fresh fruits and vegetables, whole grains, lean protein, and low-fat dairy products. Limit your intake of foods with high amounts of sugar, saturated fats, and salt. Take vitamin and mineral supplements as recommended by your health care provider. Do not drink alcohol if your health care provider tells you not to drink. If you drink alcohol: Limit how much you have to 0-2 drinks a day. Know how much alcohol is in your drink. In the [place] one drink equals one 12 oz bottle of beer (355 mL), one 5 oz glass of wine (148 mL), or one 1 oz glass of hard liquor (44 mL). Lifestyle Brush your teeth every morning and night with fluoride  toothpaste. Floss one time each day. Exercise for at least 30 minutes 5 or more days each week. Do not use any products that contain nicotine or tobacco. These products include cigarettes, chewing tobacco, and vaping devices, such as e-cigarettes. If you need help quitting, ask your health care provider. Do not use drugs. If you are sexually active, practice safe sex. Use a condom or other form of protection to prevent STIs. Take aspirin only as told by your health care provider. Make sure that you understand how much to take and what form to take. Work with your health care provider to find out whether it is safe and beneficial for you to take aspirin daily. Ask your  health care provider if you need to take a cholesterol-lowering medicine (statin). Find healthy ways to manage stress, such as: Meditation, yoga, or listening to music. Journaling. Talking to a trusted person. Spending time with friends and family. Safety Always wear your seat belt while driving or riding in a vehicle. Do not drive: If you have been drinking alcohol. Do not ride with someone who has been drinking. When you are  tired or distracted. While texting. If you have been using any mind-altering substances or drugs. Wear a helmet and other protective equipment during sports activities. If you have firearms in your house, make sure you follow all gun safety procedures. Minimize exposure to UV radiation to reduce your risk of skin cancer. What's next? Visit your health care provider once a year for an annual wellness visit. Ask your health care provider how often you should have your eyes and teeth checked. Stay up to date on all vaccines. This information is not intended to replace advice given to you by your health care provider. Make sure you discuss any questions you have with your health care provider. Document Revised: 10/16/2020 Document Reviewed: 10/16/2020 Elsevier Patient Education  2024 [employer].

## 2024-01-23 ENCOUNTER — Ambulatory Visit: Payer: Self-pay | Admitting: Family Medicine

## 2024-01-26 ENCOUNTER — Encounter: Payer: Self-pay | Admitting: Neurology

## 2024-01-31 ENCOUNTER — Ambulatory Visit: Admitting: Family Medicine

## 2024-01-31 VITALS — BP 126/70 | HR 71 | Temp 98.3°F | Resp 18 | Ht 68.0 in | Wt 167.4 lb

## 2024-01-31 DIAGNOSIS — R5383 Other fatigue: Secondary | ICD-10-CM

## 2024-01-31 DIAGNOSIS — M5440 Lumbago with sciatica, unspecified side: Secondary | ICD-10-CM

## 2024-01-31 DIAGNOSIS — G8929 Other chronic pain: Secondary | ICD-10-CM

## 2024-01-31 DIAGNOSIS — G20C Parkinsonism, unspecified: Secondary | ICD-10-CM

## 2024-01-31 NOTE — Progress Notes (Unsigned)
 Subjective:  Patient ID: Alan Ball, male    DOB: [birth year]  Age: 73 y.o. MRN: [id number]  CC:  Chief Complaint  Patient presents with   Fatigue    Still very tired. Results are normal and he is wondering why he is tired    HPI NIC LAMPE presents for   Fatigue Discussed at his visit September 19.  Noted fatigue for the previous 6 months.  Denied chest pain, palpitations, dyspnea dark stools or blood in stools at that time.  He did note some fatigue with activity but was favoring his back (referred to spine specialist at his last visit, previous cervical disease, referred for persistent sciatica symptoms.  Labs were obtained last visit, but we did discuss possible multifactorial nature of fatigue including his Parkinson's. He has appt with Dr. Burnetta on 10/14 for spine.  Appt with Dr. Evonnie on 10/29 for consult. Prior patient of Dr. Corlis, Twana Seip PA at [facility].  Labs reassuring at last visit.  No change in fatigue since last visit. Feels like fatigue has been present since parkinsons - past year and a half. No recent worsening.  Active with activities at home and with animals -chickens and goats, and busy with helping with young grandchildren at home - some increased fatigue with extra stress of family at the home only slight.  Tired at times, but no CP/palpitations, DOE or feeling winded.    Results for orders placed or performed in visit on 01/21/24  CBC   Collection Time: 01/21/24  1:44 PM  Result Value Ref Range   WBC 6.9 4.0 - 10.5 K/uL   RBC 5.05 4.22 - 5.81 Mil/uL   Platelets 300.0 150.0 - 400.0 K/uL   Hemoglobin 14.3 13.0 - 17.0 g/dL   HCT 56.3 60.9 - 47.9 %   MCV 86.4 78.0 - 100.0 fl   MCHC 32.7 30.0 - 36.0 g/dL   RDW 86.1 88.4 - 84.4 %  Comprehensive metabolic panel with GFR   Collection Time: 01/21/24  1:44 PM  Result Value Ref Range   Sodium 141 135 - 145 mEq/L   Potassium 4.3 3.5 - 5.1 mEq/L   Chloride 106 96 - 112 mEq/L   CO2 30 19 - 32 mEq/L   Glucose,  Bld 90 70 - 99 mg/dL   BUN 18 6 - 23 mg/dL   Creatinine, Ser 9.07 0.40 - 1.50 mg/dL   Total Bilirubin 0.9 0.2 - 1.2 mg/dL   Alkaline Phosphatase 67 39 - 117 U/L   AST 23 0 - 37 U/L   ALT 4 0 - 53 U/L   Total Protein 6.9 6.0 - 8.3 g/dL   Albumin 4.4 3.5 - 5.2 g/dL   GFR 18.42 >39.99 mL/min   Calcium  9.4 8.4 - 10.5 mg/dL  TSH   Collection Time: 01/21/24  1:44 PM  Result Value Ref Range   TSH 1.75 0.35 - 5.50 uIU/mL  Lipid panel   Collection Time: 01/21/24  1:44 PM  Result Value Ref Range   Cholesterol 160 0 - 200 mg/dL   Triglycerides 35.9 0.0 - 149.0 mg/dL   HDL 43.29 >60.99 mg/dL   VLDL 87.1 0.0 - 59.9 mg/dL   LDL Cholesterol 91 0 - 99 mg/dL   Total CHOL/HDL Ratio 3    NonHDL 103.33          History Patient Active Problem List   Diagnosis Date Noted   Chronic cough 10/14/2023   Mass of soft tissue of left buttock  07/29/2020   Umbilical hernia 07/29/2020   Gastritis 07/29/2020   Parkinsonism (HCC) 10/11/2019   Cervical radiculopathy 02/21/2019   DDD (degenerative disc disease), cervical 02/21/2019   Bilateral carpal tunnel syndrome 05/19/2018   Trigger middle finger of right hand 12/20/2017   De Quervain's tenosynovitis, right 12/20/2017   Carpal tunnel syndrome of right wrist 12/20/2017   Renal cyst, right 09/05/2015   Inguinal hernia 10/04/2012   Past Medical History:  Diagnosis Date   Allergy    Asthma    Hyperlipemia    Hypertension    Melanoma (HCC) 02/2022   on left side of nose   Tremor    Past Surgical History:  Procedure Laterality Date   CARPAL TUNNEL RELEASE Bilateral 2019, 2020   CATARACT EXTRACTION BILATERAL W/ ANTERIOR VITRECTOMY Bilateral    cateracts     HERNIA REPAIR Bilateral    inguinal   SHOULDER SURGERY     No Known Allergies Prior to Admission medications   Medication Sig Start Date End Date Taking? Authorizing Provider  albuterol  (VENTOLIN  HFA) 108 (90 Base) MCG/ACT inhaler Inhale 1-2 puffs into the lungs every 4 (four)  hours as needed for wheezing or shortness of breath. 01/13/23  Yes Levora Reyes SAUNDERS, MD  amLODipine  (NORVASC ) 5 MG tablet Take 1 tablet (5 mg total) by mouth daily. 01/13/23  Yes Levora Reyes SAUNDERS, MD  atorvastatin  (LIPITOR) 10 MG tablet TAKE ONE TABLET BY MOUTH ONCE A DAY 12/22/23  Yes Levora Reyes SAUNDERS, MD  carbidopa -levodopa  (SINEMET  IR) 25-100 MG tablet Take 1 tablet by mouth 3 (three) times daily. 01/31/19  Yes [provider]  fluticasone (CUTIVATE) 0.05 % cream Apply topically 2 (two) times daily as needed. 02/13/22  Yes [provider]  ibuprofen (ADVIL,MOTRIN) 200 MG tablet Take 200 mg by mouth every 6 (six) hours as needed for pain.   Yes [provider]  Multiple Vitamin (MULTIVITAMIN) tablet Take 1 tablet by mouth daily.   Yes [provider]  omeprazole  (PRILOSEC) 20 MG capsule TAKE ONE CAPSULE BY MOUTH ONCE A DAY 12/09/23  Yes Levora Reyes SAUNDERS, MD  sildenafil (VIAGRA) 50 MG tablet Take 1 tablet by mouth daily as needed.   Yes [provider]  trihexyphenidyl (ARTANE) 2 MG tablet Take 2 mg by mouth 2 (two) times daily with a meal. 01/26/24  Yes [provider]  vitamin C (ASCORBIC ACID) 500 MG tablet Take 500 mg by mouth daily.   Yes [provider]   Social History   Socioeconomic History   Marital status: Married    Spouse name: Cy   Number of children: Not on file   Years of education: Not on file   Highest education level: Bachelor's degree (e.g., BA, AB, BS)  Occupational History    Comment: retired  Tobacco Use   Smoking status: Never    Passive exposure: Never   Smokeless tobacco: Never  Vaping Use   Vaping status: Never Used  Substance and Sexual Activity   Alcohol use: Yes    Comment: socially, seldom.   Drug use: Never   Sexual activity: Not Currently  Other Topics Concern   Not on file  Social History Narrative   Lives with wife   Caffeine- coffee 3 cups daily, maybe a soda   Social Drivers of  Health   Financial Resource Strain: Low Risk  (12/03/2022)   Overall Financial Resource Strain (CARDIA)    Difficulty of Paying Living Expenses: Not hard at all  Food Insecurity:  No Food Insecurity (12/03/2022)   Hunger Vital Sign    Worried About Running Out of Food in the Last Year: Never true    Ran Out of Food in the Last Year: Never true  Transportation Needs: No Transportation Needs (12/03/2022)   PRAPARE - Administrator, Civil Service (Medical): No    Lack of Transportation (Non-Medical): No  Physical Activity: Sufficiently Active (11/26/2021)   Exercise Vital Sign    Days of Exercise per Week: 5 days    Minutes of Exercise per Session: 30 min  Stress: No Stress Concern Present (12/03/2022)   [employer] of Occupational Health - Occupational Stress Questionnaire    Feeling of Stress : Not at all  Social Connections: Moderately Integrated (12/03/2022)   Social Connection and Isolation Panel    Frequency of Communication with Friends and Family: More than three times a week    Frequency of Social Gatherings with Friends and Family: Three times a week    Attends Religious Services: More than 4 times per year    Active Member of Clubs or Organizations: No    Attends Banker Meetings: Never    Marital Status: Married  Catering manager Violence: Not At Risk (12/03/2022)   Humiliation, Afraid, Rape, and Kick questionnaire    Fear of Current or Ex-Partner: No    Emotionally Abused: No    Physically Abused: No    Sexually Abused: No    Review of Systems   Objective:   Vitals:   01/31/24 1536  BP: 126/70  Pulse: 71  Resp: 18  Temp: 98.3 F (36.8 C)  TempSrc: Temporal  SpO2: 97%  Weight: 167 lb 6.4 oz (75.9 kg)  Height: 5' 8 (1.727 m)     Physical Exam Vitals reviewed.  Constitutional:      Appearance: He is well-developed.  HENT:     Head: Normocephalic and atraumatic.  Neck:     Vascular: No carotid bruit or JVD.  Cardiovascular:      Rate and Rhythm: Normal rate and regular rhythm.     Heart sounds: Normal heart sounds. No murmur heard. Pulmonary:     Effort: Pulmonary effort is normal.     Breath sounds: Normal breath sounds. No rales.  Musculoskeletal:     Right lower leg: No edema.     Left lower leg: No edema.  Skin:    General: Skin is warm and dry.  Neurological:     Mental Status: He is alert and oriented to person, place, and time.  Psychiatric:        Mood and Affect: Mood normal.     Assessment & Plan:  HOLMAN BONSIGNORE is a 73 y.o. male . No diagnosis found.   No orders of the defined types were placed in this encounter.  There are no Patient Instructions on file for this visit.    Signed,   Reyes Pines, MD [facility], [facility] 01/31/24 4:02 PM

## 2024-01-31 NOTE — Patient Instructions (Addendum)
 Labs were reassuring. Given the timing of your fatigue and no recent changes, I think it is reasonable to discuss fatigue with neurology next month.   Depending on that visit we can certainly look at other workup if needed - let me know if anything changes.

## 2024-02-01 ENCOUNTER — Encounter: Payer: Self-pay | Admitting: Family Medicine

## 2024-02-15 DIAGNOSIS — R3915 Urgency of urination: Secondary | ICD-10-CM | POA: Diagnosis not present

## 2024-02-15 DIAGNOSIS — R972 Elevated prostate specific antigen [PSA]: Secondary | ICD-10-CM | POA: Diagnosis not present

## 2024-02-15 DIAGNOSIS — M4150 Other secondary scoliosis, site unspecified: Secondary | ICD-10-CM | POA: Diagnosis not present

## 2024-02-15 DIAGNOSIS — R350 Frequency of micturition: Secondary | ICD-10-CM | POA: Diagnosis not present

## 2024-02-15 DIAGNOSIS — R399 Unspecified symptoms and signs involving the genitourinary system: Secondary | ICD-10-CM | POA: Diagnosis not present

## 2024-02-15 DIAGNOSIS — N5201 Erectile dysfunction due to arterial insufficiency: Secondary | ICD-10-CM | POA: Diagnosis not present

## 2024-02-15 DIAGNOSIS — N401 Enlarged prostate with lower urinary tract symptoms: Secondary | ICD-10-CM | POA: Diagnosis not present

## 2024-02-15 DIAGNOSIS — G20A1 Parkinson's disease without dyskinesia, without mention of fluctuations: Secondary | ICD-10-CM | POA: Diagnosis not present

## 2024-02-15 DIAGNOSIS — M545 Low back pain, unspecified: Secondary | ICD-10-CM | POA: Diagnosis not present

## 2024-02-15 DIAGNOSIS — R351 Nocturia: Secondary | ICD-10-CM | POA: Diagnosis not present

## 2024-02-22 ENCOUNTER — Ambulatory Visit (INDEPENDENT_AMBULATORY_CARE_PROVIDER_SITE_OTHER)

## 2024-02-22 VITALS — BP 120/80 | Ht 68.0 in | Wt 180.0 lb

## 2024-02-22 DIAGNOSIS — M9903 Segmental and somatic dysfunction of lumbar region: Secondary | ICD-10-CM | POA: Diagnosis not present

## 2024-02-22 DIAGNOSIS — M5451 Vertebrogenic low back pain: Secondary | ICD-10-CM | POA: Diagnosis not present

## 2024-02-22 DIAGNOSIS — Z0000 Encounter for general adult medical examination without abnormal findings: Secondary | ICD-10-CM

## 2024-02-22 DIAGNOSIS — G20C Parkinsonism, unspecified: Secondary | ICD-10-CM | POA: Diagnosis not present

## 2024-02-22 DIAGNOSIS — M5442 Lumbago with sciatica, left side: Secondary | ICD-10-CM | POA: Diagnosis not present

## 2024-02-22 NOTE — Progress Notes (Signed)
 Because this visit was a virtual/telehealth visit,  certain criteria was not obtained, such a blood pressure, CBG if applicable, and timed get up and go. Any medications not marked as taking were not mentioned during the medication reconciliation part of the visit. Any vitals not documented were not able to be obtained due to this being a telehealth visit or patient was unable to self-report a recent blood pressure reading due to a lack of equipment at home via telehealth. Vitals that have been documented are verbally provided by the patient.  This visit was performed by a medical professional under my direct supervision. I was immediately available for consultation/collaboration. I have reviewed and agree with the Annual Wellness Visit documentation.  Subjective:   Alan Ball is a 73 y.o. who presents for a Medicare Wellness preventive visit.  As a reminder, Annual Wellness Visits don't include a physical exam, and some assessments may be limited, especially if this visit is performed virtually. We may recommend an in-person follow-up visit with your provider if needed.  Visit Complete: Virtual I connected with  Alan Ball on 02/22/24 by a audio enabled telemedicine application and verified that I am speaking with the correct person using two identifiers.  Patient Location: Home  Provider Location: Home Office  I discussed the limitations of evaluation and management by telemedicine. The patient expressed understanding and agreed to proceed.  Vital Signs: Because this visit was a virtual/telehealth visit, some criteria may be missing or patient reported. Any vitals not documented were not able to be obtained and vitals that have been documented are patient reported.  VideoDeclined- This patient declined Librarian, academic. Therefore the visit was completed with audio only.  Persons Participating in Visit: Patient.  AWV Questionnaire: No: Patient Medicare  AWV questionnaire was not completed prior to this visit.  Cardiac Risk Factors include: advanced age (>77men, >81 women);male gender     Objective:    Today's Vitals   02/22/24 1449  BP: 120/80  Weight: 180 lb (81.6 kg)  Height: 5' 8 (1.727 m)   Body mass index is 27.37 kg/m.     02/22/2024    2:55 PM 05/10/2023   12:36 PM 12/03/2022   10:13 AM 11/26/2021    9:12 AM 08/23/2020   11:23 AM 04/04/2020   11:10 AM 03/13/2019    1:39 PM  Advanced Directives  Does Patient Have a Medical Advance Directive? Yes No Yes Yes Yes No;Yes Yes  Type of Estate agent of [place];Living will  Healthcare Power of [employer] of [place];Living will Living will;Healthcare Power of [employer] Power of [employer] Power of Attorney  Does patient want to make changes to medical advance directive? No - Patient declined    No - Patient declined  No - Patient declined  Copy of Healthcare Power of Attorney in Chart? No - copy requested  No - copy requested No - copy requested   No - copy requested  Would patient like information on creating a medical advance directive?      No - Patient declined     Current Medications (verified) Outpatient Encounter Medications as of 02/22/2024  Medication Sig   albuterol  (VENTOLIN  HFA) 108 (90 Base) MCG/ACT inhaler Inhale 1-2 puffs into the lungs every 4 (four) hours as needed for wheezing or shortness of breath.   amLODipine  (NORVASC ) 5 MG tablet Take 1 tablet (5 mg total) by mouth daily.   atorvastatin  (LIPITOR) 10 MG tablet TAKE ONE  TABLET BY MOUTH ONCE A DAY   carbidopa -levodopa  (SINEMET  IR) 25-100 MG tablet Take 1 tablet by mouth 3 (three) times daily.   fluticasone (CUTIVATE) 0.05 % cream Apply topically 2 (two) times daily as needed.   ibuprofen (ADVIL,MOTRIN) 200 MG tablet Take 200 mg by mouth every 6 (six) hours as needed for pain.   Multiple Vitamin (MULTIVITAMIN) tablet Take 1 tablet by mouth daily.    omeprazole  (PRILOSEC) 20 MG capsule TAKE ONE CAPSULE BY MOUTH ONCE A DAY   sildenafil (VIAGRA) 50 MG tablet Take 1 tablet by mouth daily as needed.   trihexyphenidyl (ARTANE) 2 MG tablet Take 2 mg by mouth 2 (two) times daily with a meal.   vitamin C (ASCORBIC ACID) 500 MG tablet Take 500 mg by mouth daily.   No facility-administered encounter medications on file as of 02/22/2024.    Allergies (verified) Patient has no known allergies.   History: Past Medical History:  Diagnosis Date   Allergy    Asthma    Hyperlipemia    Hypertension    Melanoma (HCC) 02/2022   on left side of nose   Tremor    Past Surgical History:  Procedure Laterality Date   CARPAL TUNNEL RELEASE Bilateral 2019, 2020   CATARACT EXTRACTION BILATERAL W/ ANTERIOR VITRECTOMY Bilateral    cateracts     HERNIA REPAIR Bilateral    inguinal   SHOULDER SURGERY     Family History  Problem Relation Age of Onset   Hypertension Mother    Thyroid  disease Mother    Cancer Father        brain tumor (gleoplastoma)   Diabetes Father    Colon polyps Father    Colon cancer Neg Hx    Stomach cancer Neg Hx    Rectal cancer Neg Hx    Esophageal cancer Neg Hx    Pancreatic cancer Neg Hx    Social History   Socioeconomic History   Marital status: Married    Spouse name: Cy   Number of children: Not on file   Years of education: Not on file   Highest education level: Bachelor's degree (e.g., BA, AB, BS)  Occupational History    Comment: retired  Tobacco Use   Smoking status: Never    Passive exposure: Never   Smokeless tobacco: Never  Vaping Use   Vaping status: Never Used  Substance and Sexual Activity   Alcohol use: Yes    Comment: socially, seldom.   Drug use: Never   Sexual activity: Not Currently  Other Topics Concern   Not on file  Social History Narrative   Lives with wife   Caffeine- coffee 3 cups daily, maybe a soda   Social Drivers of Health   Financial Resource Strain: Low Risk   (02/22/2024)   Overall Financial Resource Strain (CARDIA)    Difficulty of Paying Living Expenses: Not hard at all  Food Insecurity: No Food Insecurity (02/22/2024)   Hunger Vital Sign    Worried About Running Out of Food in the Last Year: Never true    Ran Out of Food in the Last Year: Never true  Transportation Needs: No Transportation Needs (02/22/2024)   PRAPARE - Administrator, Civil Service (Medical): No    Lack of Transportation (Non-Medical): No  Physical Activity: Sufficiently Active (02/22/2024)   Exercise Vital Sign    Days of Exercise per Week: 7 days    Minutes of Exercise per Session: 30 min  Stress: No Stress  Concern Present (02/22/2024)   [employer] of Occupational Health - Occupational Stress Questionnaire    Feeling of Stress: Only a little  Social Connections: Moderately Integrated (02/22/2024)   Social Connection and Isolation Panel    Frequency of Communication with Friends and Family: Three times a week    Frequency of Social Gatherings with Friends and Family: Three times a week    Attends Religious Services: More than 4 times per year    Active Member of Clubs or Organizations: No    Attends Banker Meetings: Never    Marital Status: Married    Tobacco Counseling Counseling given: Not Answered    Clinical Intake:  Pre-visit preparation completed: Yes  Pain : No/denies pain     BMI - recorded: 27.37 Nutritional Status: BMI 25 -29 Overweight Nutritional Risks: None Diabetes: No  Lab Results  Component Value Date   HGBA1C 6.2 01/16/2022     How often do you need to have someone help you when you read instructions, pamphlets, or other written materials from your doctor or pharmacy?: 1 - Never  Interpreter Needed?: No  Information entered by :: Mikayah Joy,cma   Activities of Daily Living     02/22/2024    2:53 PM  In your present state of health, do you have any difficulty performing the following  activities:  Hearing? 0  Vision? 0  Difficulty concentrating or making decisions? 0  Walking or climbing stairs? 0  Dressing or bathing? 0  Doing errands, shopping? 0  Preparing Food and eating ? N  Using the Toilet? N  In the past six months, have you accidently leaked urine? Y  Do you have problems with loss of bowel control? N  Managing your Medications? N  Managing your Finances? N  Housekeeping or managing your Housekeeping? N    Patient Care Team: Levora Reyes SAUNDERS, MD as PCP - General (Family Medicine) Sheldon Standing, MD as Consulting Physician (General Surgery) Landy Twana Charlotte Mora, PA-C (Neurology) Corlis Harlene Bleacher, MD as Referring Physician (Neurology) Shila Gustav GAILS, MD as Consulting Physician (Gastroenterology) Jane Charleston, MD as Consulting Physician (Orthopedic Surgery) Tobie Franky SQUIBB, DPM as Consulting Physician (Podiatry)  I have updated your Care Teams any recent Medical Services you may have received from other providers in the past year.     Assessment:   This is a routine wellness examination for Russell.  Hearing/Vision screen Hearing Screening - Comments:: No hearing aids  Vision Screening - Comments:: Patient has had eye surgery    Goals Addressed             This Visit's Progress    Patient Stated       To be a great grandfather        Depression Screen     02/22/2024    2:56 PM 01/21/2024   11:44 AM 07/14/2023   10:53 AM 05/14/2023   10:59 AM 01/13/2023   11:41 AM 12/03/2022   10:10 AM 01/16/2022   11:03 AM  PHQ 2/9 Scores  PHQ - 2 Score 0 1 0 0 0 0 0  PHQ- 9 Score 0 5 0 0 0 0 0    Fall Risk     02/22/2024    2:55 PM 07/14/2023   10:53 AM 05/14/2023   10:59 AM 01/13/2023   11:40 AM 12/03/2022   10:02 AM  Fall Risk   Falls in the past year? 0 0 0 1 0  Number falls in past yr:  0 0 0 0 0  Injury with Fall? 0 0 0 0 0  Risk for fall due to : No Fall Risks No Fall Risks No Fall Risks No Fall Risks   Follow up Falls evaluation  completed Falls evaluation completed Falls evaluation completed Falls evaluation completed Falls evaluation completed;Education provided;Falls prevention discussed    MEDICARE RISK AT HOME:  Medicare Risk at Home Any stairs in or around the home?: No If so, are there any without handrails?: No Home free of loose throw rugs in walkways, pet beds, electrical cords, etc?: Yes Adequate lighting in your home to reduce risk of falls?: Yes Life alert?: No Use of a cane, walker or w/c?: No Grab bars in the bathroom?: Yes Shower chair or bench in shower?: Yes Elevated toilet seat or a handicapped toilet?: No  TIMED UP AND GO:  Was the test performed?  No  Cognitive Function: 6CIT completed        02/22/2024    2:51 PM 12/03/2022   10:07 AM 01/16/2022   11:04 AM 04/04/2020   11:03 AM 02/13/2019    1:30 PM  6CIT Screen  What Year? 0 points 0 points 4 points 0 points 0 points  What month? 0 points 0 points 0 points 0 points 0 points  What time? 0 points 0 points 0 points 0 points 0 points  Count back from 20 0 points 0 points 0 points 0 points 0 points  Months in reverse 0 points 2 points 0 points 0 points 2 points  Repeat phrase 0 points 0 points 0 points 0 points 2 points  Total Score 0 points 2 points 4 points 0 points 4 points    Immunizations Immunization History  Administered Date(s) Administered   Fluad Quad(high Dose 65+) 02/13/2019, 04/01/2020, 01/16/2022   Fluad Trivalent(High Dose 65+) 01/13/2023   INFLUENZA, HIGH DOSE SEASONAL PF 02/17/2018, 01/21/2024   Influenza,inj,Quad PF,6+ Mos 12/25/2013, 05/11/2016, 04/19/2017   Influenza,inj,quad, With Preservative 02/06/2019   PNEUMOCOCCAL CONJUGATE-20 01/21/2024   Pneumococcal Conjugate-13 12/25/2013   Pneumococcal Polysaccharide-23 03/16/2014   Tdap 09/08/2012   Zoster Recombinant(Shingrix ) 04/10/2019, 07/27/2019    Screening Tests Health Maintenance  Topic Date Due   DTaP/Tdap/Td (2 - Td or Tdap) 09/09/2022   COVID-19  Vaccine (1) 07/30/2024 (Originally 11/23/1955)   Medicare Annual Wellness (AWV)  02/21/2025   Colonoscopy  03/18/2025   Pneumococcal Vaccine: 50+ Years  Completed   Influenza Vaccine  Completed   Hepatitis C Screening  Completed   Zoster Vaccines- Shingrix   Completed   Meningococcal B Vaccine  Aged Out    Health Maintenance Items Addressed patient declined   Additional Screening:  Vision Screening: Recommended annual ophthalmology exams for early detection of glaucoma and other disorders of the eye. Is the patient up to date with their annual eye exam?  yes   Dental Screening: Recommended annual dental exams for proper oral hygiene  Community Resource Referral / Chronic Care Management: CRR required this visit?  No   CCM required this visit?  No   Plan:    I have personally reviewed and noted the following in the patient's chart:   Medical and social history Use of alcohol, tobacco or illicit drugs  Current medications and supplements including opioid prescriptions. Patient is not currently taking opioid prescriptions. Functional ability and status Nutritional status Physical activity Advanced directives List of other physicians Hospitalizations, surgeries, and ER visits in previous 12 months Vitals Screenings to include cognitive, depression, and falls Referrals and appointments  In addition, I have reviewed and discussed with patient certain preventive protocols, quality metrics, and best practice recommendations. A written personalized care plan for preventive services as well as general preventive health recommendations were provided to patient.   Lyle MARLA Right, [place]   02/22/2024   After Visit Summary: (MyChart) Due to this being a telephonic visit, the after visit summary with patients personalized plan was offered to patient via MyChart   Notes: Nothing significant to report at this time.

## 2024-02-22 NOTE — Progress Notes (Signed)
 This visit was performed by a medical professional under my direct supervision. I was immediately available for consultation/collaboration. I have reviewed and agree with the Annual Wellness Visit documentation.

## 2024-02-22 NOTE — Patient Instructions (Signed)
 Alan Ball,  Thank you for taking the time for your Medicare Wellness Visit. I appreciate your continued commitment to your health goals. Please review the care plan we discussed, and feel free to reach out if I can assist you further.  Medicare recommends these wellness visits once per year to help you and your care team stay ahead of potential health issues. These visits are designed to focus on prevention, allowing your provider to concentrate on managing your acute and chronic conditions during your regular appointments.  Please note that Annual Wellness Visits do not include a physical exam. Some assessments may be limited, especially if the visit was conducted virtually. If needed, we may recommend a separate in-person follow-up with your provider.  Ongoing Care Seeing your primary care provider every 3 to 6 months helps us  monitor your health and provide consistent, personalized care.   Referrals If a referral was made during today's visit and you haven't received any updates within two weeks, please contact the referred provider directly to check on the status.  Recommended Screenings:  Health Maintenance  Topic Date Due   DTaP/Tdap/Td vaccine (2 - Td or Tdap) 09/09/2022   COVID-19 Vaccine (1) 07/30/2024*   Medicare Annual Wellness Visit  02/21/2025   Colon Cancer Screening  03/18/2025   Pneumococcal Vaccine for age over 56  Completed   Flu Shot  Completed   Hepatitis C Screening  Completed   Zoster (Shingles) Vaccine  Completed   Meningitis B Vaccine  Aged Out  *Topic was postponed. The date shown is not the original due date.       02/22/2024    2:55 PM  Advanced Directives  Does Patient Have a Medical Advance Directive? Yes  Type of Estate agent of [place];Living will  Does patient want to make changes to medical advance directive? No - Patient declined  Copy of Healthcare Power of Attorney in Chart? No - copy requested   Advance Care Planning  is important because it: Ensures you receive medical care that aligns with your values, goals, and preferences. Provides guidance to your family and loved ones, reducing the emotional burden of decision-making during critical moments.  Vision: Annual vision screenings are recommended for early detection of glaucoma, cataracts, and diabetic retinopathy. These exams can also reveal signs of chronic conditions such as diabetes and high blood pressure.  Dental: Annual dental screenings help detect early signs of oral cancer, gum disease, and other conditions linked to overall health, including heart disease and diabetes.  Please see the attached documents for additional preventive care recommendations.

## 2024-02-28 NOTE — Progress Notes (Unsigned)
 Assessment/Plan:  1.  Parkinsons disease  - Discussed nature and pathophysiology.  Discussed levodopa  resistant tremor.  - Patient has not found amantadine or trihexyphenidyl to be particularly effective for tremor.  This is not that surprising.  He just weaned off amantadine.  He thinks he would like to try to wean off of trihexyphenidyl.  I have no objection to that, particularly in this age group.  He will decrease trihexyphenidyl, 2 mg, 1 tablet twice per day for 2 weeks and then 1 tablet once per day for 2 weeks and then stop trihexyphenidyl - He will continue his carbidopa /levodopa  but I did change the timing of the medication, as it is spread out too far; he will take carbidopa /levodopa  25/100 CR, 2 tablets 8am/noon/4pm.  Discussed how to take in relationship to food/protein - Wife felt that tremor had to do with his carpal tunnel surgery and I reassured her that this was not the case.  In addition, he has tremor more than just on the left hand. -Discussed that he would need surgical interventions if we are going to get rid of tremor. - Discussed importance of safe, cardiovascular exercise.  Information to community programs was given.  2.  History of melanoma -We discussed that it used to be thought that levodopa  would increase risk of melanoma but now it is believed that Parkinsons itself likely increases risk of melanoma. he is to get regular skin checks.   He saw Dr. Shona a month ago at [facility] dermatology   Subjective:   Alan Ball was seen today in the movement disorders clinic for neurologic consultation at the request of Levora Reyes SAUNDERS, MD.  The consultation is for the evaluation of Parkinsons disease. Pt with wife who supplements hx.   Patient has previously seen by [facility] as well as [facility].  Medical records from their his neurologist have been reviewed.  Patient first saw Dr. Margaret in August, 2020 after having left hand rest tremor for 3  to 4 months.  Patient was noted to have no bradykinesia on examination by Dr. Margaret.  He was started on levodopa  in 2020.  DaTscan was done and was noted to be equivocal at the time, but an addendum was done on the DaTscan noting the right head of the caudate with slightly less radiotracer activity than the left, but felt that the scan remained equivocal in nature.  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  Patient did not think that the levodopa  was particularly helpful with tremor with Dr. Margaret and he weaned off of the medication.  Not long thereafter, he transferred his care to [facility] and began to see Dr. Corlis in May, 2021.  Dr. Corlis noted left greater than right upper extremity rest tremor as well as asymmetric bradykinesia.  She also discussed with him the idea of levodopa  resistant tremor.  He was started back on levodopa , this time CR in June, 2021.  Because symptoms were mild and he had levodopa  resistant tremor, he did not really notice any early benefit with this.  In September, 2023, amantadine was added to see if that would help with tremor.  It did not particularly help, and trihexyphenidyl was added in March, 2024.  Overall, reports indicate that tremor has been refractive all, but otherwise he has done well.  Amantadine was discontinued at his last visit in June, 2025.  He remains on trihexyphenidyl.  Current movement disorder medications:  Carbidopa /levodopa  25/100 CR, 2 tablets 3 times per day (10am/4pm/10pm) Trihexyphenidyl 2 mg, 2 tablet twice per day   Specific Symptoms:  Tremor: Yes.  , L hand mostly but occasionally in the R hand but rarely.  He is R hand dominant Family hx of similar:  No. Voice: wife thinks voice is a little lower Sleep: sleeps well except for nocturia  Vivid Dreams:  No.  Acting out dreams:  No. Wet Pillows: No. Postural symptoms:  little wobbly  Falls?  No. Bradykinesia symptoms: difficulty getting out of a  chair (mild) Loss of smell:  No. Loss of taste:  No. Urinary Incontinence:  not usually Difficulty Swallowing:  No. Handwriting, micrographia: No. Trouble with ADL's:  No.  Trouble buttoning clothing: only with cuff buttons Depression:  No. Memory changes:  No. Hallucinations:  No.  visual distortions: No. N/V:  No. Lightheaded:  No.  Syncope: No. Diplopia:  No. Dyskinesia:  No.    PREVIOUS MEDICATIONS: carbidopa /levodopa  IR (no side effects just did not help tremor); carbidopa /levodopa  CR; amantadine (stopped because it was not helpful for tremor); trihexyphenidyl  ALLERGIES:  No Known Allergies  CURRENT MEDICATIONS:  Current Meds  Medication Sig   albuterol  (VENTOLIN  HFA) 108 (90 Base) MCG/ACT inhaler Inhale 1-2 puffs into the lungs every 4 (four) hours as needed for wheezing or shortness of breath.   amLODipine  (NORVASC ) 5 MG tablet Take 1 tablet (5 mg total) by mouth daily.   atorvastatin  (LIPITOR) 10 MG tablet TAKE ONE TABLET BY MOUTH ONCE A DAY   carbidopa -levodopa  (SINEMET  IR) 25-100 MG tablet Take 1 tablet by mouth 3 (three) times daily. (Patient taking differently: Take 2 tablets by mouth 3 (three) times daily.)   fluticasone (CUTIVATE) 0.05 % cream Apply topically 2 (two) times daily as needed.   ibuprofen (ADVIL,MOTRIN) 200 MG tablet Take 200 mg by mouth every 6 (six) hours as needed for pain.   Multiple Vitamin (MULTIVITAMIN) tablet Take 1 tablet by mouth daily.   omeprazole  (PRILOSEC) 20 MG capsule TAKE ONE CAPSULE BY MOUTH ONCE A DAY   sildenafil (VIAGRA) 50 MG tablet Take 1 tablet by mouth daily as needed.   trihexyphenidyl (ARTANE) 2 MG tablet Take 2 mg by mouth 2 (two) times daily with a meal. (Patient taking differently: Take 2 mg by mouth 2 (two) times daily with a meal. Take 2 in the AM and 2 in the Pm)   vitamin C (ASCORBIC ACID) 500 MG tablet Take 500 mg by mouth daily.     Objective:   VITALS:   Vitals:   03/01/24 0839  BP: (!) 154/94  Pulse: 81   SpO2: 98%  Weight: 182 lb 6.4 oz (82.7 kg)  Height: 5' 8 (1.727 m)    GEN:  The patient appears stated age and is in NAD. HEENT:  Normocephalic, atraumatic.  The mucous membranes are moist. The superficial temporal arteries are without ropiness or tenderness. CV:  RRR Lungs:  CTAB Neck/HEME:  There are no carotid bruits bilaterally.  Neurological examination:  Orientation: The patient is alert and oriented x3.  Cranial nerves: There is good facial symmetry. Extraocular muscles are intact. The visual fields are full to confrontational testing. The speech is fluent and clear. Soft palate rises symmetrically and there is no tongue deviation. Hearing is intact to conversational tone. Sensation: Sensation is intact to light and pinprick throughout (facial, trunk, extremities). Vibration is decreased distally. There is no extinction with double simultaneous stimulation. There is no sensory dermatomal level identified. Motor:  Strength is 5/5 in the bilateral upper and lower extremities.   Shoulder shrug is equal and symmetric.  There is no pronator drift. Deep tendon reflexes: Deep tendon reflexes are 1/4 at the bilateral biceps, triceps, brachioradialis, patella and achilles. Plantar responses are downgoing bilaterally.  Movement examination: Tone: There is nl tone in the bilateral upper extremities.  The tone in the lower extremities is nl.  Abnormal movements: there is L>>RUE rest tremor.  There is rare tremor of the foot on the L Coordination:  There is no decremation with RAM's, with any form of RAMS, including alternating supination and pronation of the forearm, hand opening and closing, finger taps, heel taps and toe taps.  Gait and Station: The patient has no difficulty arising out of a deep-seated chair without the use of the hands. The patient's stride length is good with L>>RUE tremor with ambulation.  The patient has a neg pull test.     I have reviewed and interpreted the following  labs independently   Chemistry      Component Value Date/Time   NA 141 01/21/2024 1344   NA 140 06/06/2020 1528   K 4.3 01/21/2024 1344   CL 106 01/21/2024 1344   CO2 30 01/21/2024 1344   BUN 18 01/21/2024 1344   BUN 13 06/06/2020 1528   CREATININE 0.92 01/21/2024 1344      Component Value Date/Time   CALCIUM  9.4 01/21/2024 1344   ALKPHOS 67 01/21/2024 1344   AST 23 01/21/2024 1344   ALT 4 01/21/2024 1344   BILITOT 0.9 01/21/2024 1344   BILITOT 0.7 06/06/2020 1528      Lab Results  Component Value Date   TSH 1.75 01/21/2024   Lab Results  Component Value Date   WBC 6.9 01/21/2024   HGB 14.3 01/21/2024   HCT 43.6 01/21/2024   MCV 86.4 01/21/2024   PLT 300.0 01/21/2024     Total time spent on today's visit was 60 minutes, including both face-to-face time and nonface-to-face time.  Time included that spent on review of records (prior notes available to me/labs/imaging if pertinent), discussing treatment and goals, answering patient's questions and coordinating care.  Cc:  Levora Reyes SAUNDERS, MD

## 2024-03-01 ENCOUNTER — Encounter: Payer: Self-pay | Admitting: Neurology

## 2024-03-01 ENCOUNTER — Ambulatory Visit: Admitting: Neurology

## 2024-03-01 VITALS — BP 154/94 | HR 81 | Ht 68.0 in | Wt 182.4 lb

## 2024-03-01 DIAGNOSIS — G20A1 Parkinson's disease without dyskinesia, without mention of fluctuations: Secondary | ICD-10-CM | POA: Diagnosis not present

## 2024-03-01 DIAGNOSIS — Z85820 Personal history of malignant melanoma of skin: Secondary | ICD-10-CM

## 2024-03-01 MED ORDER — CARBIDOPA-LEVODOPA ER 25-100 MG PO TBCR: 1.0000 | EXTENDED_RELEASE_TABLET | Freq: Two times a day (BID) | ORAL | 1 refills | Status: AC

## 2024-03-01 NOTE — Patient Instructions (Addendum)
 Wean trihexyphenidyl, 2 mg, 1 tablet twice per day for 2 weeks and then 1 tablet once per day for 2 weeks and then STOP trihexyphenidyl  Take carbidopa /levodopa  25/100 CR, 2 tablets at 8am/noon/4pm   As a reminder, carbidopa /levodopa  can be taken at the same time as a carbohydrate, but we like to have you take your pill either 30 minutes before a protein source or 1 hour after as protein can interfere with carbidopa /levodopa  absorption.   The physicians and staff at [facility] are committed to providing excellent care. You may receive a survey requesting feedback about your experience at our office. We strive to receive very good responses to the survey questions. If you feel that your experience would prevent you from giving the office a very good  response, please contact our office to try to remedy the situation. We may be reached at [phone]. Thank you for taking the time out of your busy day to complete the survey.

## 2024-03-21 DIAGNOSIS — Z008 Encounter for other general examination: Secondary | ICD-10-CM | POA: Diagnosis not present

## 2024-03-25 ENCOUNTER — Other Ambulatory Visit: Payer: Self-pay | Admitting: Family Medicine

## 2024-03-25 DIAGNOSIS — I10 Essential (primary) hypertension: Secondary | ICD-10-CM

## 2024-03-25 DIAGNOSIS — E785 Hyperlipidemia, unspecified: Secondary | ICD-10-CM

## 2024-04-03 ENCOUNTER — Encounter: Payer: Self-pay | Admitting: Podiatry

## 2024-04-03 ENCOUNTER — Ambulatory Visit: Admitting: Podiatry

## 2024-04-03 DIAGNOSIS — M79675 Pain in left toe(s): Secondary | ICD-10-CM | POA: Diagnosis not present

## 2024-04-03 DIAGNOSIS — M79674 Pain in right toe(s): Secondary | ICD-10-CM

## 2024-04-03 DIAGNOSIS — B351 Tinea unguium: Secondary | ICD-10-CM | POA: Diagnosis not present

## 2024-04-03 NOTE — Progress Notes (Signed)
 This patient presents to the office with chief complaint of long thick painful nails.  Patient says the nails are painful walking and wearing shoes.  This patient is unable to self treat.  This patient is unable to trim his  nails since he is unable to reach his nails.  he presents to the office for preventative foot care services. He presents to the office with his wife. Patient also relates callus in nail groove.  General Appearance  Alert, conversant and in no acute stress.  Vascular  Dorsalis pedis and posterior tibial  pulses are palpable  bilaterally.  Capillary return is within normal limits  bilaterally. Temperature is within normal limits  bilaterally.  Neurologic  Senn-Weinstein monofilament wire test within normal limits  bilaterally. Muscle power within normal limits bilaterally.  Nails Thick disfigured discolored nails with subungual debris  hallux nails bilaterally. No evidence of bacterial infection or drainage bilaterally.  Orthopedic  No limitations of motion  feet .  No crepitus or effusions noted.  IPJ hallux  B/L.  Skin  normotropic skin with no porokeratosis noted bilaterally.  No signs of infections or ulcers noted.     Onychomycosis  Nails  B/L.  Pain in right toes  Pain in left toes  Debridement of nails both feet followed trimming the nails with dremel tool.    RTC 6   months.   Ruffin Cotton DPM

## 2024-05-12 ENCOUNTER — Other Ambulatory Visit: Payer: Self-pay | Admitting: Family Medicine

## 2024-05-12 DIAGNOSIS — K219 Gastro-esophageal reflux disease without esophagitis: Secondary | ICD-10-CM

## 2024-07-31 ENCOUNTER — Ambulatory Visit: Admitting: Family Medicine

## 2024-08-31 ENCOUNTER — Ambulatory Visit: Admitting: Neurology

## 2024-10-02 ENCOUNTER — Ambulatory Visit: Admitting: Podiatry
